# Patient Record
Sex: Female | Born: 1935 | Race: White | Hispanic: No | Marital: Single | Smoking: Never smoker
Health system: Southern US, Community
[De-identification: ages and names within clinical notes are randomized; demographics above are authoritative.]

## PROBLEM LIST (undated history)

## (undated) DIAGNOSIS — T7840XA Allergy, unspecified, initial encounter: Secondary | ICD-10-CM

## (undated) DIAGNOSIS — I10 Essential (primary) hypertension: Secondary | ICD-10-CM

## (undated) DIAGNOSIS — N3010 Interstitial cystitis (chronic) without hematuria: Secondary | ICD-10-CM

## (undated) HISTORY — DX: Essential (primary) hypertension: I10

## (undated) HISTORY — DX: Allergy, unspecified, initial encounter: T78.40XA

## (undated) HISTORY — DX: Interstitial cystitis (chronic) without hematuria: N30.10

## (undated) HISTORY — PX: OTHER SURGICAL HISTORY: SHX169

## (undated) HISTORY — PX: ABDOMINAL HYSTERECTOMY: SHX81

---

## 2001-02-11 ENCOUNTER — Encounter: Payer: Self-pay | Admitting: Family Medicine

## 2001-02-11 LAB — CONVERTED CEMR LAB

## 2004-10-03 ENCOUNTER — Ambulatory Visit: Payer: Self-pay | Admitting: Family Medicine

## 2004-10-08 ENCOUNTER — Ambulatory Visit: Payer: Self-pay | Admitting: Family Medicine

## 2004-10-11 ENCOUNTER — Ambulatory Visit: Payer: Self-pay | Admitting: Family Medicine

## 2004-10-17 ENCOUNTER — Ambulatory Visit: Payer: Self-pay | Admitting: Family Medicine

## 2004-12-20 ENCOUNTER — Ambulatory Visit: Payer: Self-pay | Admitting: Family Medicine

## 2005-04-11 ENCOUNTER — Ambulatory Visit: Payer: Self-pay | Admitting: Family Medicine

## 2005-06-27 ENCOUNTER — Ambulatory Visit: Payer: Self-pay | Admitting: Family Medicine

## 2005-07-30 ENCOUNTER — Ambulatory Visit: Payer: Self-pay | Admitting: Family Medicine

## 2005-12-25 ENCOUNTER — Ambulatory Visit: Payer: Self-pay | Admitting: Family Medicine

## 2006-01-15 ENCOUNTER — Ambulatory Visit: Payer: Self-pay | Admitting: Family Medicine

## 2006-01-21 ENCOUNTER — Ambulatory Visit: Payer: Self-pay | Admitting: Family Medicine

## 2006-07-10 ENCOUNTER — Ambulatory Visit: Payer: Self-pay | Admitting: Family Medicine

## 2006-07-21 ENCOUNTER — Encounter: Payer: Self-pay | Admitting: Family Medicine

## 2006-07-21 DIAGNOSIS — I10 Essential (primary) hypertension: Secondary | ICD-10-CM | POA: Insufficient documentation

## 2006-07-21 DIAGNOSIS — J309 Allergic rhinitis, unspecified: Secondary | ICD-10-CM | POA: Insufficient documentation

## 2006-07-21 DIAGNOSIS — M129 Arthropathy, unspecified: Secondary | ICD-10-CM | POA: Insufficient documentation

## 2008-11-17 ENCOUNTER — Ambulatory Visit: Payer: Self-pay | Admitting: Family Medicine

## 2008-11-17 DIAGNOSIS — N3030 Trigonitis without hematuria: Secondary | ICD-10-CM

## 2008-11-17 DIAGNOSIS — D649 Anemia, unspecified: Secondary | ICD-10-CM | POA: Insufficient documentation

## 2008-11-17 DIAGNOSIS — E039 Hypothyroidism, unspecified: Secondary | ICD-10-CM | POA: Insufficient documentation

## 2008-11-17 DIAGNOSIS — E559 Vitamin D deficiency, unspecified: Secondary | ICD-10-CM | POA: Insufficient documentation

## 2008-11-17 DIAGNOSIS — E785 Hyperlipidemia, unspecified: Secondary | ICD-10-CM

## 2008-11-17 LAB — CONVERTED CEMR LAB
Bilirubin Urine: NEGATIVE
Ketones, urine, test strip: NEGATIVE
Specific Gravity, Urine: 1.015

## 2008-11-20 LAB — CONVERTED CEMR LAB
BUN: 14 mg/dL (ref 6–23)
Basophils Absolute: 0 10*3/uL (ref 0.0–0.1)
Basophils Relative: 0.3 % (ref 0.0–3.0)
Bilirubin, Direct: 0 mg/dL (ref 0.0–0.3)
CO2: 34 meq/L — ABNORMAL HIGH (ref 19–32)
Calcium: 9.4 mg/dL (ref 8.4–10.5)
Chloride: 105 meq/L (ref 96–112)
Cholesterol: 156 mg/dL (ref 0–200)
Creatinine, Ser: 0.8 mg/dL (ref 0.4–1.2)
Eosinophils Absolute: 0.1 10*3/uL (ref 0.0–0.7)
Glucose, Bld: 90 mg/dL (ref 70–99)
HDL: 56.2 mg/dL (ref >39.00)
MCHC: 34.2 g/dL (ref 30.0–36.0)
MCV: 94.1 fL (ref 78.0–100.0)
Monocytes Absolute: 0.2 10*3/uL (ref 0.1–1.0)
Neutrophils Relative %: 64.2 % (ref 43.0–77.0)
Platelets: 163 10*3/uL (ref 150.0–400.0)
RBC: 4.39 M/uL (ref 3.87–5.11)
RDW: 12.9 % (ref 11.5–14.6)
Total Protein: 7.4 g/dL (ref 6.0–8.3)
Triglycerides: 75 mg/dL (ref 0.0–149.0)
Vit D, 25-Hydroxy: 66 ng/mL (ref 30–89)

## 2009-09-27 ENCOUNTER — Telehealth: Payer: Self-pay | Admitting: Family Medicine

## 2009-10-12 ENCOUNTER — Telehealth (INDEPENDENT_AMBULATORY_CARE_PROVIDER_SITE_OTHER): Payer: Self-pay | Admitting: *Deleted

## 2009-10-13 ENCOUNTER — Ambulatory Visit: Payer: Self-pay | Admitting: Family Medicine

## 2009-10-13 LAB — CONVERTED CEMR LAB
Bilirubin Urine: NEGATIVE
Blood in Urine, dipstick: NEGATIVE
Glucose, Urine, Semiquant: NEGATIVE
Specific Gravity, Urine: 1.015
WBC Urine, dipstick: NEGATIVE
pH: 7

## 2009-10-19 ENCOUNTER — Telehealth: Payer: Self-pay | Admitting: Family Medicine

## 2010-03-15 NOTE — Progress Notes (Signed)
Summary: Pt called and said that she is weak and bp very low  Phone Note Call from Patient Call back at Home Phone [phone]   Caller: Patient Summary of Call: Pt called and said that she feels weak and her blood pressure is very low. Reading early this a.m. was 98/44.  Pt says she doesn't feel like she can drive in for an ov.    Initial call taken by: Lucy Antigua,  September 27, 2009 2:04 PM  Follow-up for Phone Call        called pt, has had diarrhea and feels weak, BP fluctuates, to increase fluids, gatorade and potassium tabs , call if no better Follow-up by: [name] MD,  September 27, 2009 4:15 PM

## 2010-03-15 NOTE — Progress Notes (Signed)
Summary: restless leg  Phone Note Call from Patient Call back at Home Phone [phone]   Caller: Patient Call For: [name] MD Summary of Call: Pt states that her "restless leg" syndrome is worse, and wants some advice or RX. [phone] Initial call taken by: Lynann Beaver CMA,  October 19, 2009 10:57 AM  Follow-up for Phone Call        would she like to come in and discuss her medication options? There are several medications to consider Follow-up by: Danise Edge MD,  October 19, 2009 1:05 PM  Additional Follow-up for Phone Call Additional follow up Details #1::        Patient notified, she will discuss date/time with her husband and call the office back for appt. Additional Follow-up by: Lucious Groves CMA,  October 19, 2009 2:51 PM

## 2010-03-15 NOTE — Progress Notes (Signed)
Summary: UTI  Phone Note Call from Patient   Caller: Patient Call For: [name] MD Summary of Call: Pt is at home with hip trauma and cannot get out of the house, but thinks she has a UTI.  OK to send a Urine specimen cup, and bring it?? [phone] Initial call taken by: Lynann Beaver CMA,  October 12, 2009 12:46 PM  Follow-up for Phone Call        Yes anyone can p/u and return the specimen cup for her then we can treat as soon as we have the specimen Follow-up by: Danise Edge MD,  October 12, 2009 1:10 PM  Additional Follow-up for Phone Call Additional follow up Details #1::        pt informed and pts husband is coming to pick up a specimen cup Additional Follow-up by: Josph Macho RMA,  October 12, 2009 1:19 PM

## 2010-05-31 ENCOUNTER — Other Ambulatory Visit: Payer: Self-pay | Admitting: Family Medicine

## 2010-08-14 ENCOUNTER — Telehealth: Payer: Self-pay

## 2010-08-14 ENCOUNTER — Ambulatory Visit (INDEPENDENT_AMBULATORY_CARE_PROVIDER_SITE_OTHER): Payer: Medicare Other | Admitting: Family Medicine

## 2010-08-14 ENCOUNTER — Encounter: Payer: Self-pay | Admitting: Family Medicine

## 2010-08-14 VITALS — BP 160/80 | Temp 98.6°F | Wt 113.0 lb

## 2010-08-14 DIAGNOSIS — N390 Urinary tract infection, site not specified: Secondary | ICD-10-CM

## 2010-08-14 DIAGNOSIS — N3010 Interstitial cystitis (chronic) without hematuria: Secondary | ICD-10-CM

## 2010-08-14 DIAGNOSIS — R531 Weakness: Secondary | ICD-10-CM

## 2010-08-14 DIAGNOSIS — R5381 Other malaise: Secondary | ICD-10-CM

## 2010-08-14 DIAGNOSIS — F419 Anxiety disorder, unspecified: Secondary | ICD-10-CM

## 2010-08-14 DIAGNOSIS — I10 Essential (primary) hypertension: Secondary | ICD-10-CM

## 2010-08-14 DIAGNOSIS — E860 Dehydration: Secondary | ICD-10-CM

## 2010-08-14 DIAGNOSIS — F411 Generalized anxiety disorder: Secondary | ICD-10-CM

## 2010-08-14 LAB — POCT URINALYSIS DIPSTICK
Glucose, UA: NEGATIVE
Nitrite, UA: NEGATIVE
Urobilinogen, UA: 0.2
pH, UA: 5

## 2010-08-14 MED ORDER — DIAZEPAM 5 MG PO TABS: ORAL_TABLET | ORAL | Status: DC

## 2010-08-14 NOTE — Telephone Encounter (Signed)
Pt called stating that she does not know if she is exausted or dehydrated but she needs to be seen.  An appointment was give to pt for August 14, 2010 at 4:00pm.  Pt is aware.

## 2010-08-14 NOTE — Patient Instructions (Addendum)
You have mild dehydration, increase fluid intakr including water and gatorade Prescribed valium for anxiety and stress Urinalysis did not reveal infection Return if needed, also stay out of the heat of the day Take benicar for hypertension

## 2010-09-16 NOTE — Progress Notes (Signed)
  Subjective:    Patient ID: Brooke Collier, female    DOB: 12/10/1935, 75 y.o.   MRN: [id number] This 75 year old white married female is in today complaining of weakness and obvious dehydration. She is in very tense and upset since her husband has cancer and is undergoing chemotherapy. She has been outside in the heat and yardwork and not drinking sufficient fluids become dehydrated or chills so has a history of essential cystitis has had some urinary frequency which we will check a urinalysis she has no symptoms of infection no sore throat no cough no earache diarrhea no fever of 98.6, blood pressure 160/80 HPI    Review of Systems CHPI     Objective:   Physical Exam the patient is a well-built well-nourished white female who appears illl but in no distress HEENT oral mucosa dry pharynx is not infected years negative no anterior lymphadenopathy Heart and lung examination within normal limits Abdomen liver spleen kidneys nonpalpable no tenderness no masses bowel sounds normal no suprapubic tenderness Extremities negative        Assessment & Plan:  Dehydration increase fluid intake water plus Gatorade or any other bizarre of liquids Hypertension continue Benicar 20 mg daily Anxiety of started Valium 5 mg 3 times a day when necessary for stress Interstitial cystitis no evidence of infection at this time

## 2010-10-31 ENCOUNTER — Encounter: Payer: Self-pay | Admitting: Family Medicine

## 2010-10-31 ENCOUNTER — Ambulatory Visit (INDEPENDENT_AMBULATORY_CARE_PROVIDER_SITE_OTHER): Payer: Medicare Other | Admitting: Family Medicine

## 2010-10-31 VITALS — BP 142/86 | HR 53 | Temp 98.4°F | Wt 122.0 lb

## 2010-10-31 DIAGNOSIS — F419 Anxiety disorder, unspecified: Secondary | ICD-10-CM

## 2010-10-31 DIAGNOSIS — I10 Essential (primary) hypertension: Secondary | ICD-10-CM

## 2010-10-31 DIAGNOSIS — F411 Generalized anxiety disorder: Secondary | ICD-10-CM

## 2010-10-31 MED ORDER — METOPROLOL SUCCINATE ER 25 MG PO TB24: 50.0000 mg | ORAL_TABLET | Freq: Every day | ORAL | Status: DC

## 2010-10-31 MED ORDER — LORAZEPAM 0.5 MG PO TABS: 0.5000 mg | ORAL_TABLET | Freq: Three times a day (TID) | ORAL | Status: DC | PRN

## 2010-10-31 NOTE — Progress Notes (Signed)
  Subjective:    Patient ID:  Brooke Collier, female    DOB: 29-Aug-1935, 75 y.o.   MRN: [id number]  HPI 75 yr old patient of Dr. Scotty Court who is here for HTN. She feels well but her BP has been elevated for the past few weeks to the range of 140-150/90s. She takes one Metoprolol daily. She has been having some mild HAs but no chest pain or SOB. She has also run out of Lorazepam which works well for her.    Review of Systems  Constitutional: Negative.   Respiratory: Negative.   Cardiovascular: Negative.   Neurological: Positive for headaches.       Objective:   Physical Exam  Constitutional: She appears well-developed and well-nourished.  Neck: Neck supple. No thyromegaly present.  Cardiovascular: Normal rate, regular rhythm, normal heart sounds and intact distal pulses.   Pulmonary/Chest: Effort normal and breath sounds normal.  Lymphadenopathy:    She has no cervical adenopathy.          Assessment & Plan:  Her HTN has been slightly out of control. We will increase the Metoprolol to 2 tablets a day for a total of 50 mg. She will monitor the BP. Recheck in one month

## 2010-11-07 ENCOUNTER — Telehealth: Payer: Self-pay

## 2010-11-07 DIAGNOSIS — R300 Dysuria: Secondary | ICD-10-CM

## 2010-11-07 DIAGNOSIS — R350 Frequency of micturition: Secondary | ICD-10-CM

## 2010-11-07 NOTE — Telephone Encounter (Signed)
Pt called and stated she would like a return call.  Attempted to call pt but phone line was busy.  Will attempt again later.
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
## 2010-11-09 ENCOUNTER — Other Ambulatory Visit (INDEPENDENT_AMBULATORY_CARE_PROVIDER_SITE_OTHER): Payer: Medicare Other

## 2010-11-09 DIAGNOSIS — N390 Urinary tract infection, site not specified: Secondary | ICD-10-CM

## 2010-11-09 LAB — POCT URINALYSIS DIPSTICK
Ketones, UA: NEGATIVE
Protein, UA: NEGATIVE
Spec Grav, UA: 1.015
Urobilinogen, UA: 0.2
pH, UA: 7

## 2010-11-09 MED ORDER — OLMESARTAN MEDOXOMIL 20 MG PO TABS: 20.0000 mg | ORAL_TABLET | Freq: Every day | ORAL | Status: DC

## 2010-11-09 NOTE — Telephone Encounter (Signed)
Per Dr. Scotty Court pt to start back on Benicar 20 mg and pt is going to find a urologist for interstitial cystitis.

## 2010-11-09 NOTE — Telephone Encounter (Signed)
Addended by: Azucena Freed on: 11/09/2010 09:04 AM   Modules accepted: Orders

## 2010-11-09 NOTE — Telephone Encounter (Signed)
Addended by: Azucena Freed on: 11/09/2010 04:32 PM   Modules accepted: Orders

## 2010-11-09 NOTE — Telephone Encounter (Signed)
Pt is aware.  Pt called back and stated she had a urinary tract infection- pt instructed to come in and give a sample.

## 2011-03-29 ENCOUNTER — Telehealth: Payer: Self-pay | Admitting: *Deleted

## 2011-03-29 ENCOUNTER — Ambulatory Visit (INDEPENDENT_AMBULATORY_CARE_PROVIDER_SITE_OTHER): Payer: Medicare Other | Admitting: Family Medicine

## 2011-03-29 ENCOUNTER — Encounter: Payer: Self-pay | Admitting: Family Medicine

## 2011-03-29 VITALS — BP 154/84 | HR 67 | Temp 98.7°F | Wt 125.0 lb

## 2011-03-29 DIAGNOSIS — F419 Anxiety disorder, unspecified: Secondary | ICD-10-CM

## 2011-03-29 DIAGNOSIS — F411 Generalized anxiety disorder: Secondary | ICD-10-CM

## 2011-03-29 DIAGNOSIS — I10 Essential (primary) hypertension: Secondary | ICD-10-CM

## 2011-03-29 NOTE — Telephone Encounter (Signed)
Pt wanted appt for high BP, and I left message to call back to see if she has scheduled with any of our MDs, and if she needs an appt.  LMTCB.

## 2011-03-29 NOTE — Telephone Encounter (Signed)
Pt has seen Dr. Clent Ridges and would like to see Dr. Clent Ridges today. Appt scheduled.

## 2011-04-01 ENCOUNTER — Encounter: Payer: Self-pay | Admitting: Family Medicine

## 2011-04-01 MED ORDER — DIAZEPAM 5 MG PO TABS: ORAL_TABLET | ORAL | Status: DC

## 2011-04-01 MED ORDER — OLMESARTAN MEDOXOMIL 20 MG PO TABS: 40.0000 mg | ORAL_TABLET | Freq: Every day | ORAL | Status: DC

## 2011-04-01 NOTE — Progress Notes (Signed)
  Subjective:    Patient ID: Brooke Collier, female    DOB: [birth year], 76 y.o.   MRN: [id number]  HPI Here for elevated BPs at home. She has felt fine but is concerned about frequent readings in the range of 150s and 160s systolic. She has also been quite anxious at times, and Diazepam helps here. However she wonders if she could take a little more than she is currently taking.    Review of Systems  Constitutional: Negative.   Respiratory: Negative.   Cardiovascular: Negative.   Psychiatric/Behavioral: The patient is nervous/anxious.        Objective:   Physical Exam  Constitutional: She appears well-developed and well-nourished.  Neck: No thyromegaly present.  Cardiovascular: Normal rate, regular rhythm, normal heart sounds and intact distal pulses.   Pulmonary/Chest: Effort normal and breath sounds normal.  Lymphadenopathy:    She has no cervical adenopathy.  Psychiatric: She has a normal mood and affect. Her behavior is normal. Thought content normal.          Assessment & Plan:  We will increase the Diazepam to 2.5 mg bid and a full 5mg  at bedtime. Also increase the Benicar to a total of 40 mg a day. Recheck in one month

## 2011-04-12 ENCOUNTER — Ambulatory Visit (INDEPENDENT_AMBULATORY_CARE_PROVIDER_SITE_OTHER): Payer: Medicare Other | Admitting: Family Medicine

## 2011-04-12 ENCOUNTER — Encounter: Payer: Self-pay | Admitting: Family Medicine

## 2011-04-12 VITALS — BP 136/78 | HR 55 | Temp 98.4°F | Wt 124.0 lb

## 2011-04-12 DIAGNOSIS — R300 Dysuria: Secondary | ICD-10-CM

## 2011-04-12 DIAGNOSIS — I10 Essential (primary) hypertension: Secondary | ICD-10-CM

## 2011-04-12 DIAGNOSIS — N3010 Interstitial cystitis (chronic) without hematuria: Secondary | ICD-10-CM

## 2011-04-12 LAB — POCT URINALYSIS DIPSTICK
Bilirubin, UA: NEGATIVE
Ketones, UA: NEGATIVE
Spec Grav, UA: 1.01
pH, UA: 7.5

## 2011-04-12 MED ORDER — METOPROLOL SUCCINATE ER 50 MG PO TB24: 50.0000 mg | ORAL_TABLET | Freq: Two times a day (BID) | ORAL | Status: DC

## 2011-04-12 MED ORDER — OLMESARTAN MEDOXOMIL 40 MG PO TABS: 40.0000 mg | ORAL_TABLET | Freq: Every day | ORAL | Status: DC

## 2011-04-12 NOTE — Progress Notes (Signed)
  Subjective:    Patient ID: Brooke Collier, female    DOB: [birth year], 76 y.o.   MRN: [id number]  HPI Here to recheck her BP and for one week of intermittent burning on urinations. No fever or nausea. She has a hx of interstitial cystitis, and she thinks this has flared up again this week. Her BP at home is well controlled in the mornings but still goes up in the evenings to the 150s systolic.    Review of Systems  Constitutional: Negative.   Respiratory: Negative.   Cardiovascular: Negative.   Gastrointestinal: Negative.   Genitourinary: Positive for dysuria. Negative for urgency and hematuria.       Objective:   Physical Exam  Constitutional: She appears well-developed and well-nourished.  Cardiovascular: Normal rate, regular rhythm, normal heart sounds and intact distal pulses.   Pulmonary/Chest: Effort normal and breath sounds normal.  Abdominal: Soft. Bowel sounds are normal. She exhibits no distension and no mass. There is no tenderness. There is no rebound and no guarding.          Assessment & Plan:  Her urine today is clear so I do think she has another episode of IC. She has never seen a urologist about this, but instead tries to manage it with her diet. I offered to refer her to Urology today, but wants to think about it first. We will increase her Metoprolol to 50 mg bid, and this should control the BP nicely.

## 2011-04-25 ENCOUNTER — Telehealth: Payer: Self-pay | Admitting: Family Medicine

## 2011-04-25 NOTE — Telephone Encounter (Signed)
noted 

## 2011-04-25 NOTE — Telephone Encounter (Signed)
Pt has questions concerning bp med. Pt decline ov

## 2011-04-25 NOTE — Telephone Encounter (Signed)
Pt was only taking Metoprolol 50 daily.  Discussed to take Metoprolol 50 mg bid and the Benicar 40 mg one po daily.  BP is running 152/72 at the lower dose.   She still feels fatigued, but will try the correct dosage, and let us know how her BP runs, and how she feels.

## 2011-05-16 ENCOUNTER — Other Ambulatory Visit: Payer: Self-pay | Admitting: Family Medicine

## 2011-05-20 NOTE — Telephone Encounter (Signed)
Call in #[phone] with 5 rf 

## 2011-05-22 ENCOUNTER — Telehealth: Payer: Self-pay | Admitting: Family Medicine

## 2011-05-22 NOTE — Telephone Encounter (Signed)
Pt called req refill of diazepam (VALIUM) 5 MG tablet to [facility] [id number]. Pt only take 1/2 pill twice a day prn.  Pt req to be called when this has been done.

## 2011-05-23 NOTE — Telephone Encounter (Signed)
Call in #[phone] with 5 rf 

## 2011-05-23 NOTE — Telephone Encounter (Signed)
Pt is aware waiting on MD °

## 2011-05-24 MED ORDER — DIAZEPAM 5 MG PO TABS: 2.5000 mg | ORAL_TABLET | Freq: Two times a day (BID) | ORAL | Status: DC | PRN

## 2011-05-24 NOTE — Telephone Encounter (Signed)
Pt called to check status of rx she is concerned because she will be out over the weekend ..please contact when called in

## 2011-05-24 NOTE — Telephone Encounter (Signed)
Script called in and spoke with pt. 

## 2011-07-19 ENCOUNTER — Ambulatory Visit: Payer: Medicare Other | Admitting: Family Medicine

## 2011-12-31 ENCOUNTER — Other Ambulatory Visit: Payer: Self-pay | Admitting: Family Medicine

## 2012-01-01 NOTE — Telephone Encounter (Signed)
Call in #[phone] with 5 rf 

## 2012-03-14 ENCOUNTER — Other Ambulatory Visit: Payer: Self-pay | Admitting: Family Medicine

## 2013-01-05 ENCOUNTER — Other Ambulatory Visit: Payer: Self-pay | Admitting: Family Medicine

## 2013-05-05 DIAGNOSIS — R3129 Other microscopic hematuria: Secondary | ICD-10-CM | POA: Insufficient documentation

## 2013-06-10 DIAGNOSIS — Z792 Long term (current) use of antibiotics: Secondary | ICD-10-CM | POA: Insufficient documentation

## 2013-06-28 DIAGNOSIS — F32A Depression, unspecified: Secondary | ICD-10-CM | POA: Insufficient documentation

## 2014-02-15 DIAGNOSIS — Z0000 Encounter for general adult medical examination without abnormal findings: Secondary | ICD-10-CM | POA: Insufficient documentation

## 2014-02-15 DIAGNOSIS — D171 Benign lipomatous neoplasm of skin and subcutaneous tissue of trunk: Secondary | ICD-10-CM | POA: Insufficient documentation

## 2014-10-05 DIAGNOSIS — R5383 Other fatigue: Secondary | ICD-10-CM | POA: Insufficient documentation

## 2014-10-05 DIAGNOSIS — R42 Dizziness and giddiness: Secondary | ICD-10-CM | POA: Insufficient documentation

## 2016-05-04 ENCOUNTER — Emergency Department (HOSPITAL_COMMUNITY): Payer: Medicare HMO

## 2016-05-04 ENCOUNTER — Inpatient Hospital Stay (HOSPITAL_COMMUNITY)
Admission: EM | Admit: 2016-05-04 | Discharge: 2016-05-06 | DRG: 480 | Disposition: A | Discharge status: Skilled Nursing Facility | Payer: Medicare HMO | Attending: Internal Medicine | Admitting: Internal Medicine

## 2016-05-04 ENCOUNTER — Encounter (HOSPITAL_COMMUNITY): Payer: Self-pay | Admitting: Emergency Medicine

## 2016-05-04 ENCOUNTER — Inpatient Hospital Stay (HOSPITAL_COMMUNITY): Payer: Medicare HMO

## 2016-05-04 ENCOUNTER — Inpatient Hospital Stay (HOSPITAL_COMMUNITY): Payer: Medicare HMO | Admitting: Certified Registered Nurse Anesthetist

## 2016-05-04 ENCOUNTER — Encounter (HOSPITAL_COMMUNITY)
Admission: EM | Disposition: A | Discharge status: Skilled Nursing Facility | Payer: Self-pay | Source: Home / Self Care | Attending: Internal Medicine

## 2016-05-04 DIAGNOSIS — Z90710 Acquired absence of both cervix and uterus: Secondary | ICD-10-CM | POA: Diagnosis not present

## 2016-05-04 DIAGNOSIS — Z881 Allergy status to other antibiotic agents status: Secondary | ICD-10-CM

## 2016-05-04 DIAGNOSIS — D62 Acute posthemorrhagic anemia: Secondary | ICD-10-CM | POA: Diagnosis not present

## 2016-05-04 DIAGNOSIS — I10 Essential (primary) hypertension: Secondary | ICD-10-CM | POA: Diagnosis present

## 2016-05-04 DIAGNOSIS — W010XXA Fall on same level from slipping, tripping and stumbling without subsequent striking against object, initial encounter: Secondary | ICD-10-CM | POA: Diagnosis present

## 2016-05-04 DIAGNOSIS — S72142A Displaced intertrochanteric fracture of left femur, initial encounter for closed fracture: Secondary | ICD-10-CM | POA: Diagnosis present

## 2016-05-04 DIAGNOSIS — Z419 Encounter for procedure for purposes other than remedying health state, unspecified: Secondary | ICD-10-CM

## 2016-05-04 DIAGNOSIS — E039 Hypothyroidism, unspecified: Secondary | ICD-10-CM | POA: Diagnosis present

## 2016-05-04 DIAGNOSIS — S72002A Fracture of unspecified part of neck of left femur, initial encounter for closed fracture: Secondary | ICD-10-CM | POA: Diagnosis not present

## 2016-05-04 DIAGNOSIS — W19XXXA Unspecified fall, initial encounter: Secondary | ICD-10-CM

## 2016-05-04 DIAGNOSIS — F419 Anxiety disorder, unspecified: Secondary | ICD-10-CM | POA: Diagnosis present

## 2016-05-04 DIAGNOSIS — E785 Hyperlipidemia, unspecified: Secondary | ICD-10-CM | POA: Diagnosis present

## 2016-05-04 DIAGNOSIS — Z79899 Other long term (current) drug therapy: Secondary | ICD-10-CM | POA: Diagnosis not present

## 2016-05-04 DIAGNOSIS — M25552 Pain in left hip: Secondary | ICD-10-CM

## 2016-05-04 DIAGNOSIS — Y92000 Kitchen of unspecified non-institutional (private) residence as  the place of occurrence of the external cause: Secondary | ICD-10-CM | POA: Diagnosis not present

## 2016-05-04 DIAGNOSIS — S72002D Fracture of unspecified part of neck of left femur, subsequent encounter for closed fracture with routine healing: Secondary | ICD-10-CM | POA: Diagnosis not present

## 2016-05-04 DIAGNOSIS — E43 Unspecified severe protein-calorie malnutrition: Secondary | ICD-10-CM | POA: Diagnosis present

## 2016-05-04 DIAGNOSIS — S72009A Fracture of unspecified part of neck of unspecified femur, initial encounter for closed fracture: Secondary | ICD-10-CM | POA: Diagnosis present

## 2016-05-04 HISTORY — PX: INTRAMEDULLARY (IM) NAIL INTERTROCHANTERIC: SHX5875

## 2016-05-04 LAB — BASIC METABOLIC PANEL
ANION GAP: 6 (ref 5–15)
BUN: 17 mg/dL (ref 6–20)
CALCIUM: 8.8 mg/dL — AB (ref 8.9–10.3)
CO2: 29 mmol/L (ref 22–32)
CREATININE: 0.79 mg/dL (ref 0.44–1.00)
Chloride: 104 mmol/L (ref 101–111)
GFR calc Af Amer: >60 mL/min (ref >60)
Glucose, Bld: 149 mg/dL — ABNORMAL HIGH (ref 65–99)
Potassium: 3.3 mmol/L — ABNORMAL LOW (ref 3.5–5.1)
Sodium: 139 mmol/L (ref 135–145)

## 2016-05-04 LAB — CBC
HCT: 38.7 % (ref 36.0–46.0)
HEMOGLOBIN: 12.8 g/dL (ref 12.0–15.0)
MCH: 29.9 pg (ref 26.0–34.0)
MCHC: 33.1 g/dL (ref 30.0–36.0)
MCV: 90.4 fL (ref 78.0–100.0)
PLATELETS: 174 10*3/uL (ref 150–400)
RBC: 4.28 MIL/uL (ref 3.87–5.11)
RDW: 13.8 % (ref 11.5–15.5)
WBC: 7.2 10*3/uL (ref 4.0–10.5)

## 2016-05-04 SURGERY — FIXATION, FRACTURE, INTERTROCHANTERIC, WITH INTRAMEDULLARY ROD
Anesthesia: General | Site: Hip | Laterality: Left

## 2016-05-04 MED ORDER — LACTATED RINGERS IV BOLUS (SEPSIS)
500.0000 mL | Freq: Once | INTRAVENOUS | Status: DC
Start: 2016-05-04 — End: 2016-05-05

## 2016-05-04 MED ORDER — SUCCINYLCHOLINE CHLORIDE 200 MG/10ML IV SOSY
PREFILLED_SYRINGE | INTRAVENOUS | Status: AC
  Filled 2016-05-04: qty 10

## 2016-05-04 MED ORDER — EPHEDRINE SULFATE 50 MG/ML IJ SOLN
INTRAMUSCULAR | Status: DC | PRN
  Administered 2016-05-04: 10 mg via INTRAVENOUS
  Administered 2016-05-04: 5 mg via INTRAVENOUS
  Administered 2016-05-04 (×2): 10 mg via INTRAVENOUS

## 2016-05-04 MED ORDER — PROPOFOL 10 MG/ML IV BOLUS
INTRAVENOUS | Status: DC | PRN
  Administered 2016-05-04: 100 mg via INTRAVENOUS

## 2016-05-04 MED ORDER — ACETAMINOPHEN 325 MG PO TABS
650.0000 mg | ORAL_TABLET | Freq: Four times a day (QID) | ORAL | Status: DC | PRN
  Administered 2016-05-05 – 2016-05-06 (×2): 650 mg via ORAL
  Filled 2016-05-04 (×2): qty 2

## 2016-05-04 MED ORDER — 0.9 % SODIUM CHLORIDE (POUR BTL) OPTIME
TOPICAL | Status: DC | PRN
  Administered 2016-05-04: 1000 mL

## 2016-05-04 MED ORDER — DIAZEPAM 2 MG PO TABS: 2.0000 mg | ORAL_TABLET | Freq: Two times a day (BID) | ORAL | Status: DC | PRN

## 2016-05-04 MED ORDER — STERILE WATER FOR IRRIGATION IR SOLN
Status: DC | PRN
  Administered 2016-05-04: 1000 mL

## 2016-05-04 MED ORDER — LACTATED RINGERS IV SOLN
INTRAVENOUS | Status: DC
  Administered 2016-05-04: 15:00:00 via INTRAVENOUS

## 2016-05-04 MED ORDER — HYDROCODONE-ACETAMINOPHEN 5-325 MG PO TABS: 1.0000 | ORAL_TABLET | Freq: Four times a day (QID) | ORAL | Status: DC | PRN

## 2016-05-04 MED ORDER — PROMETHAZINE HCL 25 MG/ML IJ SOLN: 6.2500 mg | INTRAMUSCULAR | Status: DC | PRN

## 2016-05-04 MED ORDER — ACETAMINOPHEN 650 MG RE SUPP: 650.0000 mg | Freq: Four times a day (QID) | RECTAL | Status: DC | PRN

## 2016-05-04 MED ORDER — HYDROMORPHONE HCL 1 MG/ML IJ SOLN: 0.2500 mg | INTRAMUSCULAR | Status: DC | PRN

## 2016-05-04 MED ORDER — METHOCARBAMOL 1000 MG/10ML IJ SOLN
500.0000 mg | Freq: Four times a day (QID) | INTRAVENOUS | Status: DC | PRN
  Filled 2016-05-04: qty 5

## 2016-05-04 MED ORDER — TRAMADOL HCL 50 MG PO TABS: 100.0000 mg | ORAL_TABLET | Freq: Four times a day (QID) | ORAL | Status: DC | PRN

## 2016-05-04 MED ORDER — DEXAMETHASONE SODIUM PHOSPHATE 10 MG/ML IJ SOLN
INTRAMUSCULAR | Status: DC | PRN
  Administered 2016-05-04: 10 mg via INTRAVENOUS

## 2016-05-04 MED ORDER — LIDOCAINE 2% (20 MG/ML) 5 ML SYRINGE
INTRAMUSCULAR | Status: AC
  Filled 2016-05-04: qty 5

## 2016-05-04 MED ORDER — SUCCINYLCHOLINE CHLORIDE 200 MG/10ML IV SOSY
PREFILLED_SYRINGE | INTRAVENOUS | Status: DC | PRN
  Administered 2016-05-04: 100 mg via INTRAVENOUS

## 2016-05-04 MED ORDER — SODIUM CHLORIDE 0.9 % IV SOLN: INTRAVENOUS | Status: DC

## 2016-05-04 MED ORDER — MORPHINE SULFATE (PF) 4 MG/ML IV SOLN: 0.5000 mg | INTRAVENOUS | Status: DC | PRN

## 2016-05-04 MED ORDER — EPHEDRINE 5 MG/ML INJ
INTRAVENOUS | Status: AC
  Filled 2016-05-04: qty 10

## 2016-05-04 MED ORDER — FENTANYL CITRATE (PF) 250 MCG/5ML IJ SOLN
INTRAMUSCULAR | Status: AC
  Filled 2016-05-04: qty 5

## 2016-05-04 MED ORDER — CEFAZOLIN SODIUM-DEXTROSE 2-4 GM/100ML-% IV SOLN
INTRAVENOUS | Status: AC
  Filled 2016-05-04: qty 100

## 2016-05-04 MED ORDER — METHOCARBAMOL 500 MG PO TABS: 500.0000 mg | ORAL_TABLET | Freq: Four times a day (QID) | ORAL | Status: DC | PRN

## 2016-05-04 MED ORDER — CEFAZOLIN SODIUM-DEXTROSE 2-4 GM/100ML-% IV SOLN
2.0000 g | Freq: Four times a day (QID) | INTRAVENOUS | Status: AC
  Administered 2016-05-04 – 2016-05-05 (×2): 2 g via INTRAVENOUS
  Filled 2016-05-04 (×2): qty 100

## 2016-05-04 MED ORDER — ONDANSETRON HCL 4 MG PO TABS: 4.0000 mg | ORAL_TABLET | Freq: Four times a day (QID) | ORAL | Status: DC | PRN

## 2016-05-04 MED ORDER — ONDANSETRON HCL 4 MG/2ML IJ SOLN
INTRAMUSCULAR | Status: AC
  Filled 2016-05-04: qty 2

## 2016-05-04 MED ORDER — FENTANYL CITRATE (PF) 100 MCG/2ML IJ SOLN
INTRAMUSCULAR | Status: DC | PRN
  Administered 2016-05-04: 50 ug via INTRAVENOUS
  Administered 2016-05-04 (×3): 25 ug via INTRAVENOUS

## 2016-05-04 MED ORDER — SODIUM CHLORIDE 0.9 % IV SOLN
INTRAVENOUS | Status: DC
  Administered 2016-05-04: 18:00:00 via INTRAVENOUS

## 2016-05-04 MED ORDER — METOPROLOL SUCCINATE ER 50 MG PO TB24: 50.0000 mg | ORAL_TABLET | Freq: Two times a day (BID) | ORAL | Status: DC

## 2016-05-04 MED ORDER — ONDANSETRON HCL 4 MG/2ML IJ SOLN
INTRAMUSCULAR | Status: DC | PRN
  Administered 2016-05-04: 4 mg via INTRAVENOUS

## 2016-05-04 MED ORDER — PHENOL 1.4 % MT LIQD: 1.0000 | OROMUCOSAL | Status: DC | PRN

## 2016-05-04 MED ORDER — ONDANSETRON HCL 4 MG/2ML IJ SOLN
4.0000 mg | Freq: Once | INTRAMUSCULAR | Status: AC
  Administered 2016-05-04: 4 mg via INTRAVENOUS
  Filled 2016-05-04: qty 2

## 2016-05-04 MED ORDER — KETOROLAC TROMETHAMINE 15 MG/ML IJ SOLN
15.0000 mg | Freq: Four times a day (QID) | INTRAMUSCULAR | Status: DC | PRN
  Administered 2016-05-05: 08:00:00 15 mg via INTRAVENOUS
  Filled 2016-05-04: qty 1

## 2016-05-04 MED ORDER — HYDRALAZINE HCL 20 MG/ML IJ SOLN
5.0000 mg | INTRAMUSCULAR | Status: DC | PRN
Start: 2016-05-04 — End: 2016-05-06
  Filled 2016-05-04: qty 1

## 2016-05-04 MED ORDER — DEXAMETHASONE SODIUM PHOSPHATE 10 MG/ML IJ SOLN
INTRAMUSCULAR | Status: AC
  Filled 2016-05-04: qty 1

## 2016-05-04 MED ORDER — METOCLOPRAMIDE HCL 5 MG PO TABS: 5.0000 mg | ORAL_TABLET | Freq: Three times a day (TID) | ORAL | Status: DC | PRN

## 2016-05-04 MED ORDER — DOCUSATE SODIUM 100 MG PO CAPS
100.0000 mg | ORAL_CAPSULE | Freq: Two times a day (BID) | ORAL | Status: DC
  Administered 2016-05-05 – 2016-05-06 (×3): 100 mg via ORAL
  Filled 2016-05-04 (×3): qty 1

## 2016-05-04 MED ORDER — PROPOFOL 10 MG/ML IV BOLUS
INTRAVENOUS | Status: AC
  Filled 2016-05-04: qty 20

## 2016-05-04 MED ORDER — FENTANYL CITRATE (PF) 100 MCG/2ML IJ SOLN
50.0000 ug | Freq: Once | INTRAMUSCULAR | Status: AC
  Administered 2016-05-04: 50 ug via INTRAVENOUS
  Filled 2016-05-04: qty 2

## 2016-05-04 MED ORDER — MENTHOL 3 MG MT LOZG: 1.0000 | LOZENGE | OROMUCOSAL | Status: DC | PRN

## 2016-05-04 MED ORDER — CEFAZOLIN SODIUM-DEXTROSE 2-4 GM/100ML-% IV SOLN
2.0000 g | Freq: Once | INTRAVENOUS | Status: AC
  Administered 2016-05-04: 2 g via INTRAVENOUS

## 2016-05-04 MED ORDER — ASPIRIN EC 325 MG PO TBEC
325.0000 mg | DELAYED_RELEASE_TABLET | Freq: Every day | ORAL | Status: DC
  Administered 2016-05-05 – 2016-05-06 (×2): 325 mg via ORAL
  Filled 2016-05-04 (×2): qty 1

## 2016-05-04 MED ORDER — ONDANSETRON HCL 4 MG/2ML IJ SOLN
4.0000 mg | Freq: Four times a day (QID) | INTRAMUSCULAR | Status: DC | PRN
  Administered 2016-05-04 – 2016-05-05 (×2): 4 mg via INTRAVENOUS
  Filled 2016-05-04 (×2): qty 2

## 2016-05-04 MED ORDER — METOCLOPRAMIDE HCL 5 MG/ML IJ SOLN
INTRAMUSCULAR | Status: DC | PRN
  Administered 2016-05-04: 5 mg via INTRAVENOUS

## 2016-05-04 MED ORDER — IRBESARTAN 150 MG PO TABS
150.0000 mg | ORAL_TABLET | Freq: Every day | ORAL | Status: DC
Start: 2016-05-05 — End: 2016-05-06
  Administered 2016-05-05 – 2016-05-06 (×2): 150 mg via ORAL
  Filled 2016-05-04 (×2): qty 1

## 2016-05-04 MED ORDER — METOCLOPRAMIDE HCL 5 MG/ML IJ SOLN
5.0000 mg | Freq: Three times a day (TID) | INTRAMUSCULAR | Status: DC | PRN
  Administered 2016-05-04: 18:00:00 10 mg via INTRAVENOUS
  Filled 2016-05-04: qty 2

## 2016-05-04 SURGICAL SUPPLY — 30 items
BNDG GAUZE ELAST 4 BULKY (GAUZE/BANDAGES/DRESSINGS) ×3 IMPLANT
COVER PERINEAL POST (MISCELLANEOUS) ×3 IMPLANT
DRAPE STERI IOBAN 125X83 (DRAPES) ×3 IMPLANT
DRSG MEPILEX BORDER 4X4 (GAUZE/BANDAGES/DRESSINGS) ×6 IMPLANT
DRSG MEPILEX BORDER 4X8 (GAUZE/BANDAGES/DRESSINGS) IMPLANT
DURAPREP 26ML APPLICATOR (WOUND CARE) ×3 IMPLANT
ELECT REM PT RETURN 15FT ADLT (MISCELLANEOUS) ×3 IMPLANT
FACESHIELD WRAPAROUND (MASK) ×3 IMPLANT
FACESHIELD WRAPAROUND OR TEAM (MASK) ×1 IMPLANT
GAUZE XEROFORM 1X8 LF (GAUZE/BANDAGES/DRESSINGS) ×2 IMPLANT
GAUZE XEROFORM 5X9 LF (GAUZE/BANDAGES/DRESSINGS) ×3 IMPLANT
GLOVE BIO SURGEON STRL SZ7.5 (GLOVE) ×3 IMPLANT
GLOVE BIOGEL PI IND STRL 8 (GLOVE) ×1 IMPLANT
GLOVE BIOGEL PI INDICATOR 8 (GLOVE) ×2
GLOVE ECLIPSE 8.0 STRL XLNG CF (GLOVE) IMPLANT
GOWN STRL REUS W/TWL XL LVL3 (GOWN DISPOSABLE) ×3 IMPLANT
GUIDEWIRE 3.2X400 (WIRE) ×2 IMPLANT
KIT BASIN OR (CUSTOM PROCEDURE TRAY) ×3 IMPLANT
MANIFOLD NEPTUNE II (INSTRUMENTS) ×3 IMPLANT
NAIL CANN TFNA 9MM/130 360MM (Nail) ×2 IMPLANT
NS IRRIG 1000ML POUR BTL (IV SOLUTION) ×3 IMPLANT
PACK GENERAL/GYN (CUSTOM PROCEDURE TRAY) ×3 IMPLANT
POSITIONER SURGICAL ARM (MISCELLANEOUS) ×3 IMPLANT
SCREW TFNA 90MM HIP (Orthopedic Implant) ×2 IMPLANT
STAPLER VISISTAT 35W (STAPLE) ×3 IMPLANT
SUT VIC AB 0 CT1 36 (SUTURE) ×3 IMPLANT
SUT VIC AB 1 CT1 36 (SUTURE) ×3 IMPLANT
SUT VIC AB 2-0 CT1 27 (SUTURE) ×3
SUT VIC AB 2-0 CT1 TAPERPNT 27 (SUTURE) ×1 IMPLANT
TOWEL OR 17X26 10 PK STRL BLUE (TOWEL DISPOSABLE) ×3 IMPLANT

## 2016-05-04 NOTE — Anesthesia Procedure Notes (Signed)
Procedure Name: Intubation Date/Time: 05/04/2016 2:53 PM Performed by: West Pugh Pre-anesthesia Checklist: Patient identified, Emergency Drugs available, Suction available, Patient being monitored and Timeout performed Patient Re-evaluated:Patient Re-evaluated prior to inductionOxygen Delivery Method: Circle system utilized Preoxygenation: Pre-oxygenation with 100% oxygen Intubation Type: IV induction, Rapid sequence and Cricoid Pressure applied Laryngoscope Size: Mac and 4 Grade View: Grade II Tube type: Oral Tube size: 7.0 mm Number of attempts: 1 Airway Equipment and Method: Stylet Placement Confirmation: ETT inserted through vocal cords under direct vision,  positive ETCO2,  CO2 detector and breath sounds checked- equal and bilateral Secured at: 21 cm Tube secured with: Tape Dental Injury: Teeth and Oropharynx as per pre-operative assessment

## 2016-05-04 NOTE — Anesthesia Preprocedure Evaluation (Signed)
Anesthesia Evaluation  Patient identified by MRN, date of birth, ID band Patient awake    Reviewed: Allergy & Precautions, NPO status , Patient's Chart, lab work & pertinent test results  Airway Mallampati: II  TM Distance: >3 FB Neck ROM: Full    Dental no notable dental hx.    Pulmonary neg pulmonary ROS,    Pulmonary exam normal breath sounds clear to auscultation       Cardiovascular hypertension, Pt. on medications and Pt. on home beta blockers Normal cardiovascular exam Rhythm:Regular Rate:Normal     Neuro/Psych negative neurological ROS  negative psych ROS   GI/Hepatic negative GI ROS, Neg liver ROS,   Endo/Other  Hypothyroidism   Renal/GU negative Renal ROS  negative genitourinary   Musculoskeletal negative musculoskeletal ROS (+)   Abdominal   Peds negative pediatric ROS (+)  Hematology negative hematology ROS (+)   Anesthesia Other Findings   Reproductive/Obstetrics negative OB ROS                             Anesthesia Physical Anesthesia Plan  ASA: II  Anesthesia Plan: General   Post-op Pain Management:    Induction: Intravenous  Airway Management Planned: Oral ETT  Additional Equipment:   Intra-op Plan:   Post-operative Plan: Extubation in OR  Informed Consent: I have reviewed the patients History and Physical, chart, labs and discussed the procedure including the risks, benefits and alternatives for the proposed anesthesia with the patient or authorized representative who has indicated his/her understanding and acceptance.   Dental advisory given  Plan Discussed with: CRNA and Surgeon  Anesthesia Plan Comments:         Anesthesia Quick Evaluation

## 2016-05-04 NOTE — ED Triage Notes (Signed)
Patient here from home via [facility] with complaints of fall. Complaining of left hip pain and left arm pain. Pain 3/10.

## 2016-05-04 NOTE — Brief Op Note (Signed)
05/04/2016  3:56 PM  PATIENT:  Brooke Collier  81 y.o. female  PRE-OPERATIVE DIAGNOSIS:  left hip fracture  POST-OPERATIVE DIAGNOSIS:  left hip fracture  PROCEDURE:  Procedure(s): INTRAMEDULLARY (IM) NAIL INTERTROCHANTRIC (Left)  SURGEON:  Surgeon(s) and Role:    * Mcarthur Rossetti, MD - Primary  ANESTHESIA:   general  EBL:  Total I/O In: -  Out: 100 [Blood:100]  COUNTS:  YES  DICTATION: .Other Dictation: Dictation Number [id number]  PLAN OF CARE: Admit to inpatient   PATIENT DISPOSITION:  PACU - hemodynamically stable.   Delay start of Pharmacological VTE agent (>24hrs) due to surgical blood loss or risk of bleeding: no

## 2016-05-04 NOTE — ED Provider Notes (Signed)
[facility] DEPT Provider Note   CSN: [id number] Arrival date & time: 05/04/16  1027     History   Chief Complaint Chief Complaint  Patient presents with  . Hip Pain    HPI Brooke Collier is a 81 y.o. female who presents via EMS with left sided hip and leg pain that began at after a mechanical fall that occurred at 0900 this AM. Patient was walking in the kitchen when she tripped over something on the floor, causing her to fall. Patient states she landed on her left side and did not hit her head. She denies any LOC. She denies any preceding weakness, dizziness or CP prior to falling. She was not able to move her LLE or ambulate since the incident. Her pain is 3/10 and worsened with movement of LLE. She denies any history of osteoporosis. She denies any numbness/tingling of LLE, CP, SOB, or other concerns.   The history is provided by the patient.    Past Medical History:  Diagnosis Date  . Allergy   . Chronic interstitial cystitis   . Hypertension     Patient Active Problem List   Diagnosis Date Noted  . Hip fracture ([place]) 05/04/2016  . Closed comminuted intertrochanteric fracture of proximal end of left femur ([place])   . HYPOTHYROIDISM 11/17/2008  . UNSPECIFIED VITAMIN D DEFICIENCY 11/17/2008  . HLD (hyperlipidemia) 11/17/2008  . ANEMIA 11/17/2008  . 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 Essential hypertension 07/21/2006  . ALLERGIC RHINITIS 07/21/2006  . ARTHRITIS 07/21/2006    Past Surgical History:  Procedure Laterality Date  . ABDOMINAL HYSTERECTOMY    . tonsillectomyy      OB History    No data available       Home Medications    Prior to Admission medications   Medication Sig Start Date End Date Taking? Authorizing Provider  amLODipine (NORVASC) 2.5 MG tablet Take 2.5 mg by mouth daily. 04/18/16  Yes Historical Provider, MD  BENICAR 40 MG tablet TAKE 1 TABLET BY MOUTH EVERY DAY 03/14/12  Yes Laurey Morale, MD  Calcium Carb-Cholecalciferol  (CALCIUM-VITAMIN D) 500-200 MG-UNIT tablet Take 1 tablet by mouth daily.   Yes Historical Provider, MD  Calcium Carbonate (CALCIUM 500 PO) Take by mouth daily.     Yes Historical Provider, MD  Co-Enzyme Q-10 30 MG CAPS Take 30 mg by mouth daily.   Yes Historical Provider, MD  Ergocalciferol 2000 units TABS Take 2,000 Units by mouth daily.   Yes Historical Provider, MD  QUERCETIN PO Take 1 g by mouth daily.   Yes Historical Provider, MD  UNABLE TO FIND Take 1 capsule by mouth daily. Cysta Q - Pt states she takes this for her interstitial nephritis per [facility] Recommendations   Yes Historical Provider, MD  diazepam (VALIUM) 5 MG tablet TAKE 1/2 TABLET BY MOUTH TWICE DAILY    AS NEEDED Patient not taking: Reported on 05/04/2016 12/31/11   Laurey Morale, MD  metoprolol succinate (TOPROL-XL) 50 MG 24 hr tablet Take 1 tablet (50 mg total) by mouth 2 (two) times daily. Patient not taking: Reported on 05/04/2016 04/12/11   Laurey Morale, MD    Family History No family history on file.  Social History Social History  Substance Use Topics  . Smoking status: Never Smoker  . Smokeless tobacco: Never Used  . Alcohol use No     Allergies   Ciprofloxacin   Review of Systems Review of Systems  Constitutional: Negative for chills and fever.  HENT: Negative for congestion.   Respiratory: Negative for cough and shortness of breath.   Cardiovascular: Negative for chest pain.  Gastrointestinal: Negative for abdominal pain, diarrhea, nausea and vomiting.  Genitourinary: Negative for dysuria and hematuria.  Musculoskeletal: Negative for back pain.       +left side hip pain  Skin: Negative for rash.  Neurological: Negative for dizziness, weakness, numbness and headaches.  Psychiatric/Behavioral: Negative for confusion.  All other systems reviewed and are negative.    Physical Exam Updated Vital Signs BP (!) 163/87   Pulse 83   Temp 98.3 F (36.8 C) (Oral)   Resp 15   SpO2 99%   Physical Exam   Constitutional: She is oriented to person, place, and time. She appears well-developed and well-nourished.  HENT:  Head: Normocephalic and atraumatic.  Mouth/Throat: Oropharynx is clear and moist and mucous membranes are normal.  Eyes: Conjunctivae, EOM and lids are normal. Pupils are equal, round, and reactive to light.  Neck: Full passive range of motion without pain.  Cardiovascular: Normal rate, regular rhythm, normal heart sounds and normal pulses.  Exam reveals no gallop and no friction rub.   No murmur heard. Pulses:      Dorsalis pedis pulses are 2+ on the right side, and 2+ on the left side.  Pulmonary/Chest: Effort normal and breath sounds normal.  Abdominal: Soft. Normal appearance. There is no tenderness. There is no rigidity and no guarding.  Musculoskeletal:       Right hip: She exhibits normal range of motion, no tenderness and no deformity.       Left hip: She exhibits decreased range of motion, tenderness and deformity.  Tenderness to palpation of left proximal femur. No tenderness to left distal femur, knee, or tib/fib.  LLE with slight rotation. Pain with even slightest movement of LLE. No evidence of dislocation.  Pelvis stable.  No tenderness to right hip.  Neurological: She is alert and oriented to person, place, and time.  Skin: Skin is warm and dry. Capillary refill takes less than 2 seconds.  Psychiatric: She has a normal mood and affect. Her speech is normal.  Nursing note and vitals reviewed.    ED Treatments / Results  Labs (all labs ordered are listed, but only abnormal results are displayed) Labs Reviewed  BASIC METABOLIC PANEL - Abnormal; Notable for the following:       Result Value   Potassium 3.3 (*)    Glucose, Bld 149 (*)    Calcium 8.8 (*)    All other components within normal limits  CBC    EKG  EKG Interpretation  Date/Time:  Saturday May 04 2016 10:57:38 EDT Ventricular Rate:  64 PR Interval:    QRS Duration: 103 QT  Interval:  430 QTC Calculation: 444 R Axis:   80 Text Interpretation:  Sinus rhythm Atrial premature complexes Borderline ST depression, diffuse leads No acute changes No significant change since last tracing Confirmed by NANAVATI, MD, Thelma Comp [phone]) on 05/04/2016 11:47:42 AM       Radiology Ct Head Wo Contrast  Result Date: 05/04/2016 CLINICAL DATA:  Pain following fall EXAM: CT HEAD WITHOUT CONTRAST TECHNIQUE: Contiguous axial images were obtained from the base of the skull through the vertex without intravenous contrast. COMPARISON:  None. FINDINGS: Brain: There is mild diffuse atrophy. There is no intracranial mass, hemorrhage, extra-axial fluid collection, or midline shift. There is small vessel disease throughout the centra semiovale bilaterally, most pronounced adjacent to the superior atria of the lateral  ventricles bilaterally. Elsewhere gray-white compartments are normal. No acute infarct evident. Vascular: No hyperdense vessel. There is calcification in each carotid siphon region. Skull: Bony calvarium appears intact, although there is osteoporosis. Sinuses/Orbits: There is mucosal thickening in several ethmoid air cells bilaterally. Other visualized paranasal sinuses are clear. There is rightward deviation of the nasal septum. Orbits appear symmetric bilaterally. Other: Mastoid air cells are clear. IMPRESSION: Atrophy with fairly extensive supratentorial small vessel disease. No acute infarct. No hemorrhage, mass, or extra-axial fluid collection. There are foci of RT vascular calcification. There is mild mucosal thickening in several ethmoid air cells bilaterally. There is rightward deviation of the nasal septum. Electronically Signed   By: Lowella Grip III M.D.   On: 05/04/2016 13:16   Dg Chest Portable 1 View  Result Date: 05/04/2016 CLINICAL DATA:  Pain following fall EXAM: PORTABLE CHEST 1 VIEW COMPARISON:  None. FINDINGS: There is subtle ill-defined opacity in the right upper lobe  medially. Lungs elsewhere are clear. Heart size and pulmonary vascularity are normal. No adenopathy. No bone lesions. No pneumothorax peer IMPRESSION: Subtle ill-defined opacity right upper lobe medially. Question early pneumonia in this area. Lungs elsewhere clear. Cardiac silhouette within normal limits. Followup PA and lateral chest radiographs recommended in 3-4 weeks following trial of antibiotic therapy to ensure resolution and exclude underlying malignancy. Electronically Signed   By: Lowella Grip III M.D.   On: 05/04/2016 11:56   Dg C-arm 1-60 Min-no Report  Result Date: 05/04/2016 Fluoroscopy was utilized by the requesting physician.  No radiographic interpretation.   Dg Hip Unilat With Pelvis Min 4 Views Left  Result Date: 05/04/2016 CLINICAL DATA:  Pain following fall EXAM: DG HIP (WITH OR WITHOUT PELVIS) 2+V LEFT COMPARISON:  None. FINDINGS: Frontal pelvis as well as frontal and lateral left hip images were obtained. There is a comminuted intertrochanteric femur fracture on the left with varus angulation of the fracture site. No other fractures are evident. No dislocation. There is mild symmetric narrowing of both hip joints. There is extensive calcification in both common and superficial femoral arteries. IMPRESSION: Comminuted intertrochanteric femur fracture on the left with varus angulation at the fracture site. No other fractures. No dislocations. Mild symmetric narrowing both hip joints. There is femoral artery atherosclerosis bilaterally. Electronically Signed   By: Lowella Grip III M.D.   On: 05/04/2016 11:58    Procedures Procedures (including critical care time)  Medications Ordered in ED Medications  hydrALAZINE (APRESOLINE) injection 5 mg ( Intravenous MAR Hold 05/04/16 1519)  lactated ringers bolus 500 mL ( Intravenous MAR Hold 05/04/16 1519)  promethazine (PHENERGAN) injection 6.25-12.5 mg (not administered)  HYDROmorphone (DILAUDID) injection 0.25-0.5 mg (not  administered)  ceFAZolin (ANCEF) 2-4 GM/100ML-% IVPB (not administered)  lactated ringers infusion ( Intravenous Anesthesia Volume Adjustment 05/04/16 1614)  fentaNYL (SUBLIMAZE) injection 50 mcg (50 mcg Intravenous Given 05/04/16 1119)  ondansetron (ZOFRAN) injection 4 mg (4 mg Intravenous Given 05/04/16 1202)  ceFAZolin (ANCEF) IVPB 2g/100 mL premix (2 g Intravenous Given 05/04/16 1459)     Initial Impression / Assessment and Plan / ED Course  I have reviewed the triage vital signs and the nursing notes.  Pertinent labs & imaging results that were available during my care of the patient were reviewed by me and considered in my medical decision making (see chart for details).    81 yo F with L hip pain after mechanical fall this morning at 0900. Concern for hip fracture given mechanism of injury and exam. Will XR L  hip to eval for fracture. Will give fentanyl for pain control. Will also check CT, CXR, EKG, CBC, and BMP for likely admission.   XR Hip shows comminuted intertrochanteric femur fracture. Hospitalist notified. Will contact ortho regarding fracture.   12:45 PM: Discussed results with patient and daughter. Patient has an ortho doctor that she has previously seen but he is sports medicine. Patient/family agree to contact ortho on call. Patient still feeling nauseous. Denies wanting any more pain meds. Will bolus with LR for additional fluid resuscitation. Offered additional pain medication but patient denies at this time.   1:00 PM: Dr. Marla Roe discussed with Dr. [name] (ortho on call). Will evaluate patient for surgery.    Final Clinical Impressions(s) / ED Diagnoses   Final diagnoses:  Fall    New Prescriptions Current Discharge Medication List       Brooke Collier, [place] 05/04/16 [place], MD 05/05/16 1021

## 2016-05-04 NOTE — Anesthesia Postprocedure Evaluation (Signed)
Anesthesia Post Note  Patient: Brooke Collier  Procedure(s) Performed: Procedure(s) (LRB): INTRAMEDULLARY (IM) NAIL INTERTROCHANTRIC (Left)  Patient location during evaluation: PACU Anesthesia Type: General Level of consciousness: awake and alert Pain management: pain level controlled Vital Signs Assessment: post-procedure vital signs reviewed and stable Respiratory status: spontaneous breathing, nonlabored ventilation, respiratory function stable and patient connected to nasal cannula oxygen Cardiovascular status: blood pressure returned to baseline and stable Postop Assessment: no signs of nausea or vomiting Anesthetic complications: no       Last Vitals:  Vitals:   05/04/16 1847 05/04/16 2050  BP: (!) 164/81 (!) 161/80  Pulse: 92 92  Resp: 17 16  Temp: 36.5 C 36.6 C    Last Pain:  Vitals:   05/04/16 2050  TempSrc: Oral  PainSc:                  Drexler Maland S

## 2016-05-04 NOTE — Transfer of Care (Signed)
Immediate Anesthesia Transfer of Care Note  Patient: Brooke Collier  Procedure(s) Performed: Procedure(s): INTRAMEDULLARY (IM) NAIL INTERTROCHANTRIC (Left)  Patient Location: PACU  Anesthesia Type:General  Level of Consciousness:  sedated, patient cooperative and responds to stimulation  Airway & Oxygen Therapy:Patient Spontanous Breathing and Patient connected to face mask oxgen  Post-op Assessment:  Report given to PACU RN and Post -op Vital signs reviewed and stable  Post vital signs:  Reviewed and stable  Last Vitals:  Vitals:   05/04/16 1230 05/04/16 1300  BP: (!) 168/79 (!) 163/87  Pulse: 82 83  Resp: 17 15  Temp:      Complications: No apparent anesthesia complications

## 2016-05-04 NOTE — ED Notes (Signed)
Bed: RP39 Expected date:  Expected time:  Means of arrival:  Comments: 81 yo hip injury

## 2016-05-04 NOTE — H&P (Signed)
History and Physical    Brooke Collier VVO:[id number] DOB: [birth year] DOA: 05/04/2016  I have briefly reviewed the patient's prior medical records in [facility]  PCP: [name], MD  Patient coming from: Home  Chief Complaint: Hip pain after having a fall  HPI: Brooke Collier is a 81 y.o. female with medical history significant of hypertension, anxiety, but otherwise healthy, presents to the emergency room with chief complaint of left hip pain after having a fall.  She was in her house today, doing some chores when she tripped and fell on the ground on her left side.  She denies any chest pains, she denies any syncope or passing out.  She never lost consciousness and has full recollection of her fall.  She denies any fever or chills.  She has had no abdominal pain, nausea or vomiting at home.  She is mildly nauseous in the ED after receiving pain medications.  She has been having intermittent palpitations over the last week, short-lived and resolving on their own.  She is usually independent, needs no assistance with activities of daily living and other than the hip pain she has no complaints.  ED Course: In the ED, her vital signs show elevated blood pressure 160s-170s, blood work is essentially unremarkable.  She has a x-ray of the hip which showed comminuted intertrochanteric femur fracture on the left with varus angulation at the fracture site.  ED physician discussed with Dr. [name] with orthopedic surgery, and he will see patient in consultation and probably operate later today  Review of Systems: As per HPI otherwise 10 point review of systems negative.   Past Medical History:  Diagnosis Date  . Allergy   . Chronic interstitial cystitis   . Hypertension     Past Surgical History:  Procedure Laterality Date  . ABDOMINAL HYSTERECTOMY    . tonsillectomyy       reports that she has never smoked. She has never used smokeless tobacco. She reports that she does not  drink alcohol or use drugs.  Allergies  Allergen Reactions  . Ciprofloxacin     Muscle weakness    Family history reviewed and noncontributory  Prior to Admission medications   Medication Sig Start Date End Date Taking? Authorizing Provider  BENICAR 40 MG tablet TAKE 1 TABLET BY MOUTH EVERY DAY 03/14/12   Laurey Morale, MD  Calcium Carbonate (CALCIUM 500 PO) Take by mouth daily.      Historical Provider, MD  diazepam (VALIUM) 5 MG tablet TAKE 1/2 TABLET BY MOUTH TWICE DAILY    AS NEEDED 12/31/11   Laurey Morale, MD  metoprolol succinate (TOPROL-XL) 50 MG 24 hr tablet Take 1 tablet (50 mg total) by mouth 2 (two) times daily. 04/12/11   Laurey Morale, MD    Physical Exam: Vitals:   05/04/16 1041 05/04/16 1057 05/04/16 1200  BP: (!) 148/81 (!) 168/71 (!) 182/88  Pulse: 69 70 (!) 116  Resp: 16 14 11   Temp: 98.3 F (36.8 C)    TempSrc: Oral    SpO2: 98% 100% 96%    Constitutional: NAD, calm, comfortable Vitals:   05/04/16 1041 05/04/16 1057 05/04/16 1200  BP: (!) 148/81 (!) 168/71 (!) 182/88  Pulse: 69 70 (!) 116  Resp: 16 14 11   Temp: 98.3 F (36.8 C)    TempSrc: Oral    SpO2: 98% 100% 96%   Eyes: PERRL, lids and conjunctivae normal ENMT: Mucous membranes are moist. Posterior pharynx clear of  any exudate or lesions.  Neck: normal, supple, no masses Respiratory: clear to auscultation bilaterally, no wheezing, no crackles. Normal respiratory effort. No accessory muscle use.  Cardiovascular: Regular rate and rhythm, no murmurs / rubs / gallops. No extremity edema. 2+ pedal pulses.  Abdomen: slight tenderness in the epigastric area, no masses palpated. Bowel sounds positive.  Musculoskeletal: no clubbing / cyanosis. Normal muscle tone.  Skin: no rashes, lesions, ulcers. No induration Neurologic: CN 2-12 grossly intact. Strength 5/5 in all 4.  Psychiatric: Normal judgment and insight. Alert and oriented x 3. Normal mood.   Labs on Admission: I have personally reviewed following  labs and imaging studies  CBC:  Recent Labs Lab 05/04/16 1111  WBC 7.2  HGB 12.8  HCT 38.7  MCV 90.4  PLT 361   Basic Metabolic Panel:  Recent Labs Lab 05/04/16 1111  NA 139  K 3.3*  CL 104  CO2 29  GLUCOSE 149*  BUN 17  CREATININE 0.79  CALCIUM 8.8*   GFR: CrCl cannot be calculated (Unknown ideal weight.). Liver Function Tests: No results for input(s): AST, ALT, ALKPHOS, BILITOT, PROT, ALBUMIN in the last 168 hours. No results for input(s): LIPASE, AMYLASE in the last 168 hours. No results for input(s): AMMONIA in the last 168 hours. Coagulation Profile: No results for input(s): INR, PROTIME in the last 168 hours. Cardiac Enzymes: No results for input(s): CKTOTAL, CKMB, CKMBINDEX, TROPONINI in the last 168 hours. BNP (last 3 results) No results for input(s): PROBNP in the last 8760 hours. HbA1C: No results for input(s): HGBA1C in the last 72 hours. CBG: No results for input(s): GLUCAP in the last 168 hours. Lipid Profile: No results for input(s): CHOL, HDL, LDLCALC, TRIG, CHOLHDL, LDLDIRECT in the last 72 hours. Thyroid Function Tests: No results for input(s): TSH, T4TOTAL, FREET4, T3FREE, THYROIDAB in the last 72 hours. Anemia Panel: No results for input(s): VITAMINB12, FOLATE, FERRITIN, TIBC, IRON, RETICCTPCT in the last 72 hours. Urine analysis:    Component Value Date/Time   COLORURINE yellow 10/13/2009 1514   APPEARANCEUR Clear 10/13/2009 1514   LABSPEC 1.015 10/13/2009 1514   PHURINE 7.0 10/13/2009 1514   HGBUR negative 10/13/2009 1514   BILIRUBINUR neg 04/12/2011 1234   PROTEINUR neg 04/12/2011 1234   UROBILINOGEN 0.2 04/12/2011 1234   UROBILINOGEN 0.2 10/13/2009 1514   NITRITE neg 04/12/2011 1234   NITRITE negative 10/13/2009 1514   LEUKOCYTESUR Negative 04/12/2011 1234     Radiological Exams on Admission: Dg Chest Portable 1 View  Result Date: 05/04/2016 CLINICAL DATA:  Pain following fall EXAM: PORTABLE CHEST 1 VIEW COMPARISON:  None.  FINDINGS: There is subtle ill-defined opacity in the right upper lobe medially. Lungs elsewhere are clear. Heart size and pulmonary vascularity are normal. No adenopathy. No bone lesions. No pneumothorax peer IMPRESSION: Subtle ill-defined opacity right upper lobe medially. Question early pneumonia in this area. Lungs elsewhere clear. Cardiac silhouette within normal limits. Followup PA and lateral chest radiographs recommended in 3-4 weeks following trial of antibiotic therapy to ensure resolution and exclude underlying malignancy. Electronically Signed   By: Lowella Grip III M.D.   On: 05/04/2016 11:56   Dg Hip Unilat With Pelvis Min 4 Views Left  Result Date: 05/04/2016 CLINICAL DATA:  Pain following fall EXAM: DG HIP (WITH OR WITHOUT PELVIS) 2+V LEFT COMPARISON:  None. FINDINGS: Frontal pelvis as well as frontal and lateral left hip images were obtained. There is a comminuted intertrochanteric femur fracture on the left with varus angulation of the fracture  site. No other fractures are evident. No dislocation. There is mild symmetric narrowing of both hip joints. There is extensive calcification in both common and superficial femoral arteries. IMPRESSION: Comminuted intertrochanteric femur fracture on the left with varus angulation at the fracture site. No other fractures. No dislocations. Mild symmetric narrowing both hip joints. There is femoral artery atherosclerosis bilaterally. Electronically Signed   By: Lowella Grip III M.D.   On: 05/04/2016 11:58    EKG: Independently reviewed.  Sinus rhythm  Assessment/Plan Active Problems:   HLD (hyperlipidemia)   Essential hypertension   Hip fracture (HCC)   Hip fracture -Orthopedic surgery will see patient, plan for operative repair later today -From medical standpoint, the perioperative risk is low at 0.2%, no further investigations are required and she can proceed with surgery  Hypertension -She is n.p.o., resume her home  antihypertensive starting tomorrow, place patient on IV hydralazine as needed  Anxiety -Resume home medications   DVT prophylaxis: Per orthopedic surgery postop,  SCDs Code Status: Full code Family Communication: Discussed with daughter at bedside Disposition Plan: Admit to [facility] called: Orthopedic surgery    Admission status: Inpatient    At the time of admission, it appears that the appropriate admission status for this patient is INPATIENT. This is judged to be reasonable and necessary in order to provide the required high service intensity to ensure the patient's safety given the presenting symptoms, physical exam findings, and initial radiographic and laboratory data in the context of their chronic comorbidities. Current circumstances are hip fracture requiring operative repair, and it is felt to place patient at high risk for further clinical deterioration threatening life, limb, or organ. Moreover, it is my clinical judgment that the patient will require inpatient hospital care spanning beyond 2 midnights from the point of admission and that early discharge would result in unnecessary risk of decompensation and readmission or threat to life, limb or bodily function.   Marzetta Board, MD [facility] Pager [phone]  If 7PM-7AM, please contact night-coverage [email or web address] Password [facility]  05/04/2016, 12:50 PM

## 2016-05-04 NOTE — Consult Note (Signed)
Reason for Consult:  Left hip fracture Referring Physician: Derwood Kaplan, MD  Brooke Collier is an 81 y.o. female.  HPI:   81 yo female who sustained an accidental mechanical fall this am when she got tripped up turning around.  Landed directly on her left hip.  Noted severe left hip pain and the inability to ambulate.  Was brought to the [facility] ED and found to have a left hip fracture.  Ortho is consulted for further evaluation and treatment.  She denies LOC or a syncopal episode.  She denies chest pain, shortness or breath, fever, chills, but has nausea since being here in the ED.  Her left hip pain is 10 out of 10.  She denies other injuries.  Past Medical History:  Diagnosis Date  . Allergy   . Chronic interstitial cystitis   . Hypertension     Past Surgical History:  Procedure Laterality Date  . ABDOMINAL HYSTERECTOMY    . tonsillectomyy      No family history on file.  Social History:  reports that she has never smoked. She has never used smokeless tobacco. She reports that she does not drink alcohol or use drugs.  Allergies:  Allergies  Allergen Reactions  . Ciprofloxacin     Muscle weakness    Medications: I have reviewed the patient's current medications.  Results for orders placed or performed during the hospital encounter of 05/04/16 (from the past 48 hour(s))  CBC     Status: None   Collection Time: 05/04/16 11:11 AM  Result Value Ref Range   WBC 7.2 4.0 - 10.5 K/uL   RBC 4.28 3.87 - 5.11 MIL/uL   Hemoglobin 12.8 12.0 - 15.0 g/dL   HCT 47.5 74.7 - 13.9 %   MCV 90.4 78.0 - 100.0 fL   MCH 29.9 26.0 - 34.0 pg   MCHC 33.1 30.0 - 36.0 g/dL   RDW 32.0 42.8 - 93.5 %   Platelets 174 150 - 400 K/uL  Basic metabolic panel     Status: Abnormal   Collection Time: 05/04/16 11:11 AM  Result Value Ref Range   Sodium 139 135 - 145 mmol/L   Potassium 3.3 (L) 3.5 - 5.1 mmol/L   Chloride 104 101 - 111 mmol/L   CO2 29 22 - 32 mmol/L   Glucose, Bld 149 (H) 65 -  99 mg/dL   BUN 17 6 - 20 mg/dL   Creatinine, Ser 7.66 0.44 - 1.00 mg/dL   Calcium 8.8 (L) 8.9 - 10.3 mg/dL   GFR calc non Af Amer >60 >60 mL/min   GFR calc Af Amer >60 >60 mL/min    Comment: (NOTE) The eGFR has been calculated using the CKD EPI equation. This calculation has not been validated in all clinical situations. eGFR's persistently <60 mL/min signify possible Chronic Kidney Disease.    Anion gap 6 5 - 15    Ct Head Wo Contrast  Result Date: 05/04/2016 CLINICAL DATA:  Pain following fall EXAM: CT HEAD WITHOUT CONTRAST TECHNIQUE: Contiguous axial images were obtained from the base of the skull through the vertex without intravenous contrast. COMPARISON:  None. FINDINGS: Brain: There is mild diffuse atrophy. There is no intracranial mass, hemorrhage, extra-axial fluid collection, or midline shift. There is small vessel disease throughout the centra semiovale bilaterally, most pronounced adjacent to the superior atria of the lateral ventricles bilaterally. Elsewhere gray-white compartments are normal. No acute infarct evident. Vascular: No hyperdense vessel. There is calcification in each carotid siphon  region. Skull: Bony calvarium appears intact, although there is osteoporosis. Sinuses/Orbits: There is mucosal thickening in several ethmoid air cells bilaterally. Other visualized paranasal sinuses are clear. There is rightward deviation of the nasal septum. Orbits appear symmetric bilaterally. Other: Mastoid air cells are clear. IMPRESSION: Atrophy with fairly extensive supratentorial small vessel disease. No acute infarct. No hemorrhage, mass, or extra-axial fluid collection. There are foci of RT vascular calcification. There is mild mucosal thickening in several ethmoid air cells bilaterally. There is rightward deviation of the nasal septum. Electronically Signed   By: Lowella Grip III M.D.   On: 05/04/2016 13:16   Dg Chest Portable 1 View  Result Date: 05/04/2016 CLINICAL DATA:   Pain following fall EXAM: PORTABLE CHEST 1 VIEW COMPARISON:  None. FINDINGS: There is subtle ill-defined opacity in the right upper lobe medially. Lungs elsewhere are clear. Heart size and pulmonary vascularity are normal. No adenopathy. No bone lesions. No pneumothorax peer IMPRESSION: Subtle ill-defined opacity right upper lobe medially. Question early pneumonia in this area. Lungs elsewhere clear. Cardiac silhouette within normal limits. Followup PA and lateral chest radiographs recommended in 3-4 weeks following trial of antibiotic therapy to ensure resolution and exclude underlying malignancy. Electronically Signed   By: Lowella Grip III M.D.   On: 05/04/2016 11:56   Dg Hip Unilat With Pelvis Min 4 Views Left  Result Date: 05/04/2016 CLINICAL DATA:  Pain following fall EXAM: DG HIP (WITH OR WITHOUT PELVIS) 2+V LEFT COMPARISON:  None. FINDINGS: Frontal pelvis as well as frontal and lateral left hip images were obtained. There is a comminuted intertrochanteric femur fracture on the left with varus angulation of the fracture site. No other fractures are evident. No dislocation. There is mild symmetric narrowing of both hip joints. There is extensive calcification in both common and superficial femoral arteries. IMPRESSION: Comminuted intertrochanteric femur fracture on the left with varus angulation at the fracture site. No other fractures. No dislocations. Mild symmetric narrowing both hip joints. There is femoral artery atherosclerosis bilaterally. Electronically Signed   By: Lowella Grip III M.D.   On: 05/04/2016 11:58    Review of Systems  Musculoskeletal: Positive for joint pain.  All other systems reviewed and are negative.  Blood pressure (!) 163/87, pulse 83, temperature 98.3 F (36.8 C), temperature source Oral, resp. rate 15, SpO2 99 %. Physical Exam  Constitutional: She is oriented to person, place, and time. She appears well-developed and well-nourished.  HENT:  Head:  Normocephalic and atraumatic.  Eyes: EOM are normal. Pupils are equal, round, and reactive to light.  Neck: Normal range of motion. Neck supple.  Cardiovascular: Normal rate and regular rhythm.   Respiratory: Effort normal and breath sounds normal.  GI: Soft. Bowel sounds are normal.  Musculoskeletal:       Left hip: She exhibits decreased range of motion, decreased strength, tenderness, bony tenderness and deformity.  Neurological: She is alert and oriented to person, place, and time.  Skin: Skin is warm and dry.  Psychiatric: She has a normal mood and affect.   I independently reviewed her hip films and see that she has a displaced left hip intertrochanteric fracture.   Assessment/Plan:  Left hip with displaced intertrochanteric proximal femur fracture 1)  We plan to proceed to surgery today for surgical fixation of her left hip fracture using an intramedullary nail/hip screw.  Risks and benefits have been discussed in detail and informed consent is obtained.  She has been evaluated by [facility] for clearance for surgery.  Mcarthur Rossetti 05/04/2016, 2:11 PM

## 2016-05-05 DIAGNOSIS — F419 Anxiety disorder, unspecified: Secondary | ICD-10-CM

## 2016-05-05 LAB — COMPREHENSIVE METABOLIC PANEL
ALK PHOS: 45 U/L (ref 38–126)
ALT: 23 U/L (ref 14–54)
AST: 29 U/L (ref 15–41)
Albumin: 3.4 g/dL — ABNORMAL LOW (ref 3.5–5.0)
Anion gap: 8 (ref 5–15)
BUN: 16 mg/dL (ref 6–20)
CALCIUM: 8.5 mg/dL — AB (ref 8.9–10.3)
CO2: 26 mmol/L (ref 22–32)
CREATININE: 0.78 mg/dL (ref 0.44–1.00)
Chloride: 102 mmol/L (ref 101–111)
Glucose, Bld: 170 mg/dL — ABNORMAL HIGH (ref 65–99)
Potassium: 3.5 mmol/L (ref 3.5–5.1)
Sodium: 136 mmol/L (ref 135–145)
Total Bilirubin: 0.6 mg/dL (ref 0.3–1.2)
Total Protein: 5.5 g/dL — ABNORMAL LOW (ref 6.5–8.1)

## 2016-05-05 LAB — CBC
HCT: 28.1 % — ABNORMAL LOW (ref 36.0–46.0)
Hemoglobin: 9.9 g/dL — ABNORMAL LOW (ref 12.0–15.0)
MCH: 31.2 pg (ref 26.0–34.0)
MCHC: 35.2 g/dL (ref 30.0–36.0)
MCV: 88.6 fL (ref 78.0–100.0)
PLATELETS: 160 10*3/uL (ref 150–400)
RBC: 3.17 MIL/uL — AB (ref 3.87–5.11)
RDW: 13.6 % (ref 11.5–15.5)
WBC: 8.6 10*3/uL (ref 4.0–10.5)

## 2016-05-05 MED ORDER — DIAZEPAM 2 MG PO TABS: 2.0000 mg | ORAL_TABLET | Freq: Two times a day (BID) | ORAL | Status: DC | PRN

## 2016-05-05 MED ORDER — POLYSACCHARIDE IRON COMPLEX 150 MG PO CAPS
150.0000 mg | ORAL_CAPSULE | Freq: Every day | ORAL | Status: DC
  Administered 2016-05-05 – 2016-05-06 (×2): 150 mg via ORAL
  Filled 2016-05-05 (×2): qty 1

## 2016-05-05 MED ORDER — MORPHINE SULFATE (PF) 4 MG/ML IV SOLN: 0.5000 mg | INTRAVENOUS | Status: DC | PRN

## 2016-05-05 NOTE — Evaluation (Signed)
Physical Therapy Evaluation Patient Details Name: Brooke Collier MRN: [id number] DOB: Nov 13, 1935 Today's Date: 05/05/2016   History of Present Illness  81 yo female s/p IM nail for IT fx 05/04/16. Hx of HTN, anxiety  Clinical Impression  On eval, pt required Min-Mod assist for mobility. She walked ~30 feet with a RW. Pain rated 2/10 in L LE but pt c/o greater pain in bil UEs. Pt presents with general weakness, decreased activity tolerance, and impaired gait and balance. Recommend ST rehab at SNF to improve mobility and regain independence-pt is agreeable. Will follow.     Follow Up Recommendations SNF    Equipment Recommendations   (TBD at next venue)    Recommendations for Other Services       Precautions / Restrictions Precautions Precautions: Fall Restrictions Weight Bearing Restrictions: No LLE Weight Bearing: Weight bearing as tolerated      Mobility  Bed Mobility Overal bed mobility: Needs Assistance Bed Mobility: Supine to Sit     Supine to sit: HOB elevated;Mod assist     General bed mobility comments: Increased time. Assist for L LE and trunk. VCs safety, technique.   Transfers Overall transfer level: Needs assistance Equipment used: Rolling walker (2 wheeled) Transfers: Sit to/from Stand Sit to Stand: Min assist;From elevated surface         General transfer comment: Assist to rise, stabilize, control descent. VCs safety, technique, hand/LE placement.   Ambulation/Gait Ambulation/Gait assistance: Min assist Ambulation Distance (Feet): 30 Feet Assistive device: Rolling walker (2 wheeled) Gait Pattern/deviations: Step-to pattern     General Gait Details: VCs safety, technique, sequence, step length. Slow gait speed. Pt c/o UE pain, fatigue > L LE. Mild nausea.  Stairs            Wheelchair Mobility    Modified Rankin (Stroke Patients Only)       Balance Overall balance assessment: Needs assistance;History of Falls            Standing balance-Leahy Scale: Poor Standing balance comment: requring RW                             Pertinent Vitals/Pain Pain Assessment: Faces Faces Pain Scale: Hurts little more Pain Location: L LE with activity (2/10). However pt c/o more pain in bil UEs.  Pain Descriptors / Indicators: Aching;Sore Pain Intervention(s): Limited activity within patient's tolerance;Repositioned    Home Living Family/patient expects to be discharged to:: Skilled nursing facility Living Arrangements: Alone   Type of Home: House Home Access: Stairs to enter       Home Equipment: Environmental consultant - 2 wheels      Prior Function Level of Independence: Independent               Hand Dominance        Extremity/Trunk Assessment   Upper Extremity Assessment Upper Extremity Assessment: Generalized weakness (pt c/o pain in bil UEs)    Lower Extremity Assessment Lower Extremity Assessment: Generalized weakness (s/p L IM nail)    Cervical / Trunk Assessment Cervical / Trunk Assessment: Kyphotic  Communication   Communication: No difficulties  Cognition Arousal/Alertness: Awake/alert Behavior During Therapy: WFL for tasks assessed/performed Overall Cognitive Status: Within Functional Limits for tasks assessed  General Comments      Exercises     Assessment/Plan    PT Assessment Patient needs continued PT services  PT Problem List Decreased strength;Decreased mobility;Decreased balance;Decreased activity tolerance;Decreased knowledge of use of DME;Pain;Decreased range of motion       PT Treatment Interventions DME instruction;Therapeutic activities;Gait training;Therapeutic exercise;Patient/family education;Stair training;Balance training;Functional mobility training    PT Goals (Current goals can be found in the Care Plan section)  Acute Rehab PT Goals Patient Stated Goal: regain independence PT Goal Formulation:  With patient/family Time For Goal Achievement: 05/19/16 Potential to Achieve Goals: Good    Frequency Min 3X/week   Barriers to discharge        Co-evaluation               End of Session Equipment Utilized During Treatment: Gait belt Activity Tolerance: Patient tolerated treatment well Patient left: in chair;with call bell/phone within reach;with family/visitor present   PT Visit Diagnosis: Muscle weakness (generalized) (M62.81);Difficulty in walking, not elsewhere classified (R26.2)    Time: 1601-0932 PT Time Calculation (min) (ACUTE ONLY): 15 min   Charges:   PT Evaluation $PT Eval Low Complexity: 1 Procedure     PT G Codes:          Weston Anna, MPT Pager: [phone]

## 2016-05-05 NOTE — Progress Notes (Signed)
Subjective: 1 Day Post-Op Procedure(s) (LRB): INTRAMEDULLARY (IM) NAIL INTERTROCHANTRIC (Left) Patient reports pain as mild.    Objective: Vital signs in last 24 hours: Temp:  [96.3 F (35.7 C)-98.4 F (36.9 C)] 97.7 F (36.5 C) (03/25 0631) Pulse Rate:  [69-116] 78 (03/25 1014) Resp:  [11-17] 16 (03/25 0631) BP: (113-182)/(55-98) 113/55 (03/25 1014) SpO2:  [93 %-100 %] 100 % (03/25 0631) Weight:  [111 lb 15.9 oz (50.8 kg)] 111 lb 15.9 oz (50.8 kg) (03/24 1736)  Intake/Output from previous day: 03/24 0701 - 03/25 0700 In: 1200 [P.O.:300; I.V.:900] Out: 1370 [Urine:1270; Blood:100] Intake/Output this shift: Total I/O In: 240 [P.O.:240] Out: 300 [Urine:300]   Recent Labs  05/04/16 1111 05/05/16 0355  HGB 12.8 9.9*    Recent Labs  05/04/16 1111 05/05/16 0355  WBC 7.2 8.6  RBC 4.28 3.17*  HCT 38.7 28.1*  PLT 174 160    Recent Labs  05/04/16 1111 05/05/16 0355  NA 139 136  K 3.3* 3.5  CL 104 102  CO2 29 26  BUN 17 16  CREATININE 0.79 0.78  GLUCOSE 149* 170*  CALCIUM 8.8* 8.5*   No results for input(s): LABPT, INR in the last 72 hours.  Neurologically intact ABD soft Neurovascular intact Compartment soft Left hip dressings clean and dry,no calf pain Assessment/Plan: 1 Day Post-Op Procedure(s) (LRB): INTRAMEDULLARY (IM) NAIL INTERTROCHANTRIC (Left) Saline lock IV, OOB with PT-will need skilled care for rehab at discharge  Garald Balding 05/05/2016, 10:38 AM

## 2016-05-05 NOTE — Progress Notes (Signed)
qPhysical Therapy Treatment Patient Details Name: Brooke Collier MRN: [id number] DOB: May 29, 1935 Today's Date: 05/05/2016    History of Present Illness 81 yo female s/p IM nail for IT fx 05/04/16. Hx of HTN, anxiety    PT Comments    Pt sitting on commode in bathroom when therapist arrived. C/o L LE numbness from sitting on commode so long. Increased pain with ambulation this session so distance limited. Assisted pt back to bed. Continue to recommend SNF for rehab.     Follow Up Recommendations  SNF     Equipment Recommendations   (TBD at next venue)    Recommendations for Other Services       Precautions / Restrictions Precautions Precautions: Fall Restrictions Weight Bearing Restrictions: No LLE Weight Bearing: Weight bearing as tolerated    Mobility  Bed Mobility Overal bed mobility: Needs Assistance Bed Mobility: Sit to Supine      Sit to supine: Min assist;HOB elevated   General bed mobility comments: Increased time. Assist for L LE and trunk. VCs safety, technique.   Transfers Overall transfer level: Needs assistance Equipment used: Rolling walker (2 wheeled) Transfers: Sit to/from Stand Sit to Stand: Min assist         General transfer comment: NT assisted pt off of commode. Assist needed to control descent to elevated bed after short walk from bathroom to bed.   Ambulation/Gait Ambulation/Gait assistance: Min assist Ambulation Distance (Feet): 15 Feet Assistive device: Rolling walker (2 wheeled) Gait Pattern/deviations: Step-to pattern;Antalgic     General Gait Details: VCs safety, technique, sequence, step length. Slow gait speed. Increased pain this session so distance limited.    Stairs            Wheelchair Mobility    Modified Rankin (Stroke Patients Only)       Balance Overall balance assessment: Needs assistance;History of Falls           Standing balance-Leahy Scale: Poor Standing balance comment: requring RW                             Cognition Arousal/Alertness: Awake/alert Behavior During Therapy: WFL for tasks assessed/performed Overall Cognitive Status: Within Functional Limits for tasks assessed                                        Exercises      General Comments        Pertinent Vitals/Pain Pain Assessment: Faces Faces Pain Scale: Hurts even more Pain Location: L LE with activity. However pt c/o more pain in bil UEs.  Pain Descriptors / Indicators: Sore;Aching Pain Intervention(s): Limited activity within patient's tolerance;Repositioned    Home Living Family/patient expects to be discharged to:: Skilled nursing facility Living Arrangements: Alone   Type of Home: House Home Access: Stairs to enter     Home Equipment: Environmental consultant - 2 wheels      Prior Function Level of Independence: Independent          PT Goals (current goals can now be found in the care plan section) Acute Rehab PT Goals Patient Stated Goal: regain independence PT Goal Formulation: With patient/family Time For Goal Achievement: 05/19/16 Potential to Achieve Goals: Good Progress towards PT goals: Progressing toward goals    Frequency    Min 3X/week      PT Plan Current plan remains appropriate  Co-evaluation             End of Session Equipment Utilized During Treatment: Gait belt Activity Tolerance: Patient limited by pain Patient left: in bed;with call bell/phone within reach;with family/visitor present;with bed alarm set   PT Visit Diagnosis: Muscle weakness (generalized) (M62.81);Difficulty in walking, not elsewhere classified (R26.2)     Time: 9728-2060 PT Time Calculation (min) (ACUTE ONLY): 10 min  Charges:  $Gait Training: 8-22 mins                    G Codes:          Weston Anna, MPT Pager: [phone]

## 2016-05-05 NOTE — Progress Notes (Signed)
[facility] PROGRESS NOTE  Brooke Collier FTD:[id number] DOB: Oct 13, 1935 DOA: 05/04/2016 PCP: [name], MD  Interim summary and HPI 81 y.o. female with medical history significant of hypertension, anxiety, but otherwise healthy, presents to the emergency room with chief complaint of left hip pain after having a fall.  She was in her house today, doing some chores when she tripped and fell on the ground on her left side.  She denies any chest pains, she denies any syncope or passing out.  She never lost consciousness and has full recollection of her fall.  She denies any fever or chills.  She has had no abdominal pain, nausea or vomiting at home.  She is mildly nauseous in the ED after receiving pain medications.  She has been having intermittent palpitations over the last week, short-lived and resolving on their own.  She is usually independent, needs no assistance with activities of daily living and other than the hip pain she has no complaints.  Assessment/Plan: 1. Left hip fracture -s/p surgical repair (IM nail placed by ortho on 3/24) -continue supportive care, PRN analgesics and PT evaluation -will follow post operative rec;s from orthopedic service  2. HTN -BP is well controlled -will continue current antihypertensives regimen   3. HLD -will resume Co-Q 10 at discharge  4. Anxiety -stable -will continue PRN valium  5. Acute blood loss anemia -no need for transfusion currently -Hgb 9.9 -started on niferex -will follow Hgb trend    Code Status: Full code Family Communication: son at bedside  Disposition Plan: to be determined; needs to be seen by PT; will follow post operative rec's from orthopedic service and follow Hgb trend.   Consultants:  Orthopedic service   Procedures: INTRAMEDULLARY (IM) NAIL INTERTROCHANTRIC (Left) 3/24  Antibiotics:  None   HPI/Subjective: Complaining of nausea; no fever, no vomiting, no CP, no SOB. Reports pain in her hip is  mild currently.  Objective: Vitals:   05/05/16 1014 05/05/16 1045  BP: (!) 113/55 120/74  Pulse: 78 74  Resp:  15  Temp:  98.8 F (37.1 C)    Intake/Output Summary (Last 24 hours) at 05/05/16 1205 Last data filed at 05/05/16 1003  Gross per 24 hour  Intake             1440 ml  Output             1670 ml  Net             -230 ml   Filed Weights   05/04/16 1736  Weight: 50.8 kg (111 lb 15.9 oz)    Exam:   General:  Afebrile. No CP, no SOB and reports pain is mild. Patient having some nausea.   Cardiovascular: S1 and S2, no rubs, no gallops  Respiratory: CTA bilaterally  Abdomen: soft, NT, ND, positive BS  Musculoskeletal: no cyanosis, no clubbing; no calf pain and left hip dressing cleana nd dry.  Data Reviewed: Basic Metabolic Panel:  Recent Labs Lab 05/04/16 1111 05/05/16 0355  NA 139 136  K 3.3* 3.5  CL 104 102  CO2 29 26  GLUCOSE 149* 170*  BUN 17 16  CREATININE 0.79 0.78  CALCIUM 8.8* 8.5*   Liver Function Tests:  Recent Labs Lab 05/05/16 0355  AST 29  ALT 23  ALKPHOS 45  BILITOT 0.6  PROT 5.5*  ALBUMIN 3.4*   CBC:  Recent Labs Lab 05/04/16 1111 05/05/16 0355  WBC 7.2 8.6  HGB 12.8 9.9*  HCT 38.7  28.1*  MCV 90.4 88.6  PLT 174 160    Studies: Ct Head Wo Contrast  Result Date: 05/04/2016 CLINICAL DATA:  Pain following fall EXAM: CT HEAD WITHOUT CONTRAST TECHNIQUE: Contiguous axial images were obtained from the base of the skull through the vertex without intravenous contrast. COMPARISON:  None. FINDINGS: Brain: There is mild diffuse atrophy. There is no intracranial mass, hemorrhage, extra-axial fluid collection, or midline shift. There is small vessel disease throughout the centra semiovale bilaterally, most pronounced adjacent to the superior atria of the lateral ventricles bilaterally. Elsewhere gray-white compartments are normal. No acute infarct evident. Vascular: No hyperdense vessel. There is calcification in each carotid siphon  region. Skull: Bony calvarium appears intact, although there is osteoporosis. Sinuses/Orbits: There is mucosal thickening in several ethmoid air cells bilaterally. Other visualized paranasal sinuses are clear. There is rightward deviation of the nasal septum. Orbits appear symmetric bilaterally. Other: Mastoid air cells are clear. IMPRESSION: Atrophy with fairly extensive supratentorial small vessel disease. No acute infarct. No hemorrhage, mass, or extra-axial fluid collection. There are foci of RT vascular calcification. There is mild mucosal thickening in several ethmoid air cells bilaterally. There is rightward deviation of the nasal septum. Electronically Signed   By: Lowella Grip III M.D.   On: 05/04/2016 13:16   Dg Chest Portable 1 View  Result Date: 05/04/2016 CLINICAL DATA:  Pain following fall EXAM: PORTABLE CHEST 1 VIEW COMPARISON:  None. FINDINGS: There is subtle ill-defined opacity in the right upper lobe medially. Lungs elsewhere are clear. Heart size and pulmonary vascularity are normal. No adenopathy. No bone lesions. No pneumothorax peer IMPRESSION: Subtle ill-defined opacity right upper lobe medially. Question early pneumonia in this area. Lungs elsewhere clear. Cardiac silhouette within normal limits. Followup PA and lateral chest radiographs recommended in 3-4 weeks following trial of antibiotic therapy to ensure resolution and exclude underlying malignancy. Electronically Signed   By: Lowella Grip III M.D.   On: 05/04/2016 11:56   Dg C-arm 1-60 Min-no Report  Result Date: 05/04/2016 Fluoroscopy was utilized by the requesting physician.  No radiographic interpretation.   Dg Hip Operative Unilat W Or W/o Pelvis Left  Result Date: 05/04/2016 CLINICAL DATA:  IM nail for left hip fx; 1 min 12 sec fluoro time EXAM: OPERATIVE LEFT HIP (WITH PELVIS IF PERFORMED) 5 VIEWS TECHNIQUE: Fluoroscopic spot image(s) were submitted for interpretation post-operatively. COMPARISON:  05/04/2016  FINDINGS: Fluoroscopic images are submitted, demonstrating intramedullary nail/ lag screw fixation of the intertrochanteric fracture of left hip. No interval fractures identified. IMPRESSION: Status post IM nail fixation of left hip fracture. No adverse features identified Electronically Signed   By: Nolon Nations M.D.   On: 05/04/2016 17:19   Dg Hip Unilat With Pelvis Min 4 Views Left  Result Date: 05/04/2016 CLINICAL DATA:  Pain following fall EXAM: DG HIP (WITH OR WITHOUT PELVIS) 2+V LEFT COMPARISON:  None. FINDINGS: Frontal pelvis as well as frontal and lateral left hip images were obtained. There is a comminuted intertrochanteric femur fracture on the left with varus angulation of the fracture site. No other fractures are evident. No dislocation. There is mild symmetric narrowing of both hip joints. There is extensive calcification in both common and superficial femoral arteries. IMPRESSION: Comminuted intertrochanteric femur fracture on the left with varus angulation at the fracture site. No other fractures. No dislocations. Mild symmetric narrowing both hip joints. There is femoral artery atherosclerosis bilaterally. Electronically Signed   By: Lowella Grip III M.D.   On: 05/04/2016 11:58  Scheduled Meds: . aspirin EC  325 mg Oral Q breakfast  . docusate sodium  100 mg Oral BID  . irbesartan  150 mg Oral Daily  . iron polysaccharides  150 mg Oral Daily   Continuous Infusions: . lactated ringers      Active Problems:   HLD (hyperlipidemia)   Essential hypertension   Hip fracture ([place])    Time spent: 25 minutes    Barton Dubois  [facility] Pager [phone]. If 7PM-7AM, please contact night-coverage at [email or web address], password [email or web address] 05/05/2016, 12:05 PM  LOS: 1 day

## 2016-05-06 ENCOUNTER — Encounter (HOSPITAL_COMMUNITY): Payer: Self-pay | Admitting: Orthopaedic Surgery

## 2016-05-06 DIAGNOSIS — I10 Essential (primary) hypertension: Secondary | ICD-10-CM

## 2016-05-06 DIAGNOSIS — E785 Hyperlipidemia, unspecified: Secondary | ICD-10-CM

## 2016-05-06 DIAGNOSIS — D62 Acute posthemorrhagic anemia: Secondary | ICD-10-CM

## 2016-05-06 DIAGNOSIS — E43 Unspecified severe protein-calorie malnutrition: Secondary | ICD-10-CM | POA: Insufficient documentation

## 2016-05-06 DIAGNOSIS — S72002D Fracture of unspecified part of neck of left femur, subsequent encounter for closed fracture with routine healing: Secondary | ICD-10-CM

## 2016-05-06 LAB — BASIC METABOLIC PANEL
ANION GAP: 4 — AB (ref 5–15)
BUN: 22 mg/dL — ABNORMAL HIGH (ref 6–20)
CALCIUM: 8.1 mg/dL — AB (ref 8.9–10.3)
CO2: 28 mmol/L (ref 22–32)
CREATININE: 0.76 mg/dL (ref 0.44–1.00)
Chloride: 105 mmol/L (ref 101–111)
GFR calc Af Amer: >60 mL/min (ref >60)
GFR calc non Af Amer: >60 mL/min (ref >60)
Glucose, Bld: 98 mg/dL (ref 65–99)
Potassium: 3.6 mmol/L (ref 3.5–5.1)
SODIUM: 137 mmol/L (ref 135–145)

## 2016-05-06 LAB — CBC
HEMATOCRIT: 25.2 % — AB (ref 36.0–46.0)
HEMOGLOBIN: 8.7 g/dL — AB (ref 12.0–15.0)
MCH: 30.9 pg (ref 26.0–34.0)
MCHC: 34.5 g/dL (ref 30.0–36.0)
MCV: 89.4 fL (ref 78.0–100.0)
Platelets: 139 10*3/uL — ABNORMAL LOW (ref 150–400)
RBC: 2.82 MIL/uL — ABNORMAL LOW (ref 3.87–5.11)
RDW: 14.1 % (ref 11.5–15.5)
WBC: 7.3 10*3/uL (ref 4.0–10.5)

## 2016-05-06 MED ORDER — ASPIRIN 325 MG PO TBEC: 325.0000 mg | DELAYED_RELEASE_TABLET | Freq: Every day | ORAL | 0 refills | Status: DC

## 2016-05-06 MED ORDER — POLYSACCHARIDE IRON COMPLEX 150 MG PO CAPS: 150.0000 mg | ORAL_CAPSULE | Freq: Every day | ORAL | Status: DC

## 2016-05-06 MED ORDER — ACETAMINOPHEN 325 MG PO TABS: 650.0000 mg | ORAL_TABLET | Freq: Four times a day (QID) | ORAL | Status: AC | PRN

## 2016-05-06 MED ORDER — ENSURE ENLIVE PO LIQD
237.0000 mL | Freq: Two times a day (BID) | ORAL | Status: DC
  Administered 2016-05-06: 237 mL via ORAL

## 2016-05-06 MED ORDER — ADULT MULTIVITAMIN W/MINERALS CH
1.0000 | ORAL_TABLET | Freq: Every day | ORAL | Status: DC
  Administered 2016-05-06: 14:00:00 1 via ORAL
  Filled 2016-05-06: qty 1

## 2016-05-06 MED ORDER — ENSURE ENLIVE PO LIQD: 237.0000 mL | Freq: Two times a day (BID) | ORAL | Status: DC

## 2016-05-06 MED ORDER — POLYETHYLENE GLYCOL 3350 17 G PO PACK: 17.0000 g | PACK | Freq: Every day | ORAL | Status: DC

## 2016-05-06 MED ORDER — OXYCODONE-ACETAMINOPHEN 5-325 MG PO TABS: 1.0000 | ORAL_TABLET | ORAL | 0 refills | Status: DC | PRN

## 2016-05-06 MED ORDER — DOCUSATE SODIUM 100 MG PO CAPS: 100.0000 mg | ORAL_CAPSULE | Freq: Two times a day (BID) | ORAL | Status: DC

## 2016-05-06 NOTE — NC FL2 (Signed)
[place] LEVEL OF CARE SCREENING TOOL     IDENTIFICATION  Patient Name: Brooke Collier Birthdate: 1935-07-20 Sex: female Admission Date (Current Location): 05/04/2016  [facility] and [place] Number:  Herbalist and Address:  [facility],  [place] [address], [place]      Provider Number: [id number]  Attending Physician Name and Address:  Barton Dubois, MD  Relative Name and Phone Number:       Current Level of Care: Hospital Recommended Level of Care: [place] Prior Approval Number:    Date Approved/Denied:   PASRR Number: [id number]  Discharge Plan: SNF    Current Diagnoses: Patient Active Problem List   Diagnosis Date Noted  . Hip fracture ([place]) 05/04/2016  . Closed comminuted intertrochanteric fracture of proximal end of left femur ([place])   . HYPOTHYROIDISM 11/17/2008  . UNSPECIFIED VITAMIN D DEFICIENCY 11/17/2008  . HLD (hyperlipidemia) 11/17/2008  . ANEMIA 11/17/2008  . 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 Essential hypertension 07/21/2006  . ALLERGIC RHINITIS 07/21/2006  . ARTHRITIS 07/21/2006    Orientation RESPIRATION BLADDER Height & Weight     Self, Time, Situation, Place  Normal Continent Weight: 111 lb 15.9 oz (50.8 kg) Height:  5\' 5"  (165.1 cm)  BEHAVIORAL SYMPTOMS/MOOD NEUROLOGICAL BOWEL NUTRITION STATUS  Other (Comment) (no behavior)   Continent Diet  AMBULATORY STATUS COMMUNICATION OF NEEDS Skin   Limited Assist Verbally Surgical wounds                       Personal Care Assistance Level of Assistance  Bathing, Feeding, Dressing Bathing Assistance: Limited assistance Feeding assistance: Independent Dressing Assistance: Limited assistance     Functional Limitations Info  Sight, Hearing, Speech Sight Info: Adequate Hearing Info: Impaired Speech Info: Adequate    SPECIAL CARE FACTORS FREQUENCY  PT (By licensed PT), OT (By licensed OT)     PT Frequency: 5-6 x  wk OT Frequency: 5-6 x wk            Contractures Contractures Info: Not present    Additional Factors Info  Code Status Code Status Info: Full Code             Current Medications (05/06/2016):  This is the current hospital active medication list Current Facility-Administered Medications  Medication Dose Route Frequency Provider Last Rate Last Dose  . acetaminophen (TYLENOL) tablet 650 mg  650 mg Oral Q6H PRN Mcarthur Rossetti, MD   650 mg at 05/06/16 2458   Or  . acetaminophen (TYLENOL) suppository 650 mg  650 mg Rectal Q6H PRN Mcarthur Rossetti, MD      . aspirin EC tablet 325 mg  325 mg Oral Q breakfast Mcarthur Rossetti, MD   325 mg at 05/06/16 0925  . diazepam (VALIUM) tablet 2 mg  2 mg Oral Q12H PRN Barton Dubois, MD      . docusate sodium (COLACE) capsule 100 mg  100 mg Oral BID Mcarthur Rossetti, MD   100 mg at 05/06/16 0998  . hydrALAZINE (APRESOLINE) injection 5 mg  5 mg Intravenous Q4H PRN [place]in Karlyne Greenspan, MD      . HYDROcodone-acetaminophen (NORCO/VICODIN) 5-325 MG per tablet 1-2 tablet  1-2 tablet Oral Q6H PRN Mcarthur Rossetti, MD      . irbesartan (AVAPRO) tablet 150 mg  150 mg Oral Daily Caren Griffins, MD   150 mg at 05/06/16 0925  . iron polysaccharides (NIFEREX) capsule 150  mg  150 mg Oral Daily Barton Dubois, MD   150 mg at 05/06/16 0925  . ketorolac (TORADOL) 15 MG/ML injection 15 mg  15 mg Intravenous Q6H PRN Caren Griffins, MD   15 mg at 05/05/16 0807  . lactated ringers infusion   Intravenous Continuous Myrtie Soman, MD   Stopped at 05/06/16 0900  . menthol-cetylpyridinium (CEPACOL) lozenge 3 mg  1 lozenge Oral PRN Mcarthur Rossetti, MD       Or  . phenol (CHLORASEPTIC) mouth spray 1 spray  1 spray Mouth/Throat PRN Mcarthur Rossetti, MD      . methocarbamol (ROBAXIN) tablet 500 mg  500 mg Oral Q6H PRN Mcarthur Rossetti, MD       Or  . methocarbamol (ROBAXIN) 500 mg in dextrose 5 % 50 mL IVPB  500 mg Intravenous  Q6H PRN Mcarthur Rossetti, MD      . metoCLOPramide (REGLAN) tablet 5-10 mg  5-10 mg Oral Q8H PRN Mcarthur Rossetti, MD       Or  . metoCLOPramide (REGLAN) injection 5-10 mg  5-10 mg Intravenous Q8H PRN Mcarthur Rossetti, MD   10 mg at 05/04/16 1815  . morphine 4 MG/ML injection 0.52 mg  0.52 mg Intravenous Q4H PRN Barton Dubois, MD      . ondansetron [facility]) tablet 4 mg  4 mg Oral Q6H PRN Mcarthur Rossetti, MD       Or  . ondansetron [facility]) injection 4 mg  4 mg Intravenous Q6H PRN Mcarthur Rossetti, MD   4 mg at 05/05/16 0806  . traMADol (ULTRAM) tablet 100 mg  100 mg Oral Q6H PRN Mcarthur Rossetti, MD         Discharge Medications: Please see discharge summary for a list of discharge medications.  Relevant Imaging Results:  Relevant Lab Results:   Additional Information SS # [id number]  Velva Molinari, Randall An, LCSW

## 2016-05-06 NOTE — Progress Notes (Signed)
qPhysical Therapy Treatment Patient Details Name: Brooke Collier MRN: [id number] DOB: 09/26/1935 Today's Date: 05/06/2016    History of Present Illness 81 yo female s/p IM nail for IT fx 05/04/16. Hx of HTN, anxiety    PT Comments    The patient C/O mild nausea and  A little weak. Noted HGB 8.7. Did ambulate farther distance today. Plans SNF    Follow Up Recommendations  SNF     Equipment Recommendations    none   Recommendations for Other Services       Precautions / Restrictions Precautions Precautions: Fall Restrictions Weight Bearing Restrictions: No LLE Weight Bearing: Weight bearing as tolerated    Mobility  Bed Mobility Overal bed mobility: Needs Assistance             General bed mobility comments: pt in chair  Transfers Overall transfer level: Needs assistance Equipment used: Rolling walker (2 wheeled) Transfers: Sit to/from Stand Sit to Stand: Min assist;Mod assist         General transfer comment: Assist to rise, stabilize, control descent. VCs safety, technique, hand/LE placement.   Ambulation/Gait   Ambulation Distance (Feet): 70 Feet Assistive device: Rolling walker (2 wheeled) Gait Pattern/deviations: Step-to pattern;Step-through pattern     General Gait Details: VCs safety, technique, sequence, step length. upright postural cues.   Stairs            Wheelchair Mobility    Modified Rankin (Stroke Patients Only)       Balance                                            Cognition Arousal/Alertness: Awake/alert Behavior During Therapy: WFL for tasks assessed/performed Overall Cognitive Status: Within Functional Limits for tasks assessed                                        Exercises      General Comments        Pertinent Vitals/Pain Pain Score: 5  Pain Location: L hip area Pain Descriptors / Indicators: Sore Pain Intervention(s): Premedicated before session;Ice applied     Home Living Family/patient expects to be discharged to:: Skilled nursing facility Living Arrangements: Alone   Type of Home: House Home Access: Stairs to enter     Home Equipment: Environmental consultant - 2 wheels      Prior Function Level of Independence: Independent          PT Goals (current goals can now be found in the care plan section) Acute Rehab PT Goals Patient Stated Goal: regain independence Progress towards PT goals: Progressing toward goals    Frequency    Min 3X/week      PT Plan Current plan remains appropriate    Co-evaluation             End of Session   Activity Tolerance: Patient tolerated treatment well Patient left: in chair;with call bell/phone within reach;with family/visitor present Nurse Communication: Mobility status PT Visit Diagnosis: Muscle weakness (generalized) (M62.81);Difficulty in walking, not elsewhere classified (R26.2)     Time: 6063-0160 PT Time Calculation (min) (ACUTE ONLY): 33 min  Charges:  $Gait Training: 8-22 mins $Self Care/Home Management: 8-22  G CodesTresa Endo PT [phone]   Claretha Cooper 05/06/2016, 12:22 PM

## 2016-05-06 NOTE — Progress Notes (Signed)
Patient is able to turn and reposition self.

## 2016-05-06 NOTE — Progress Notes (Signed)
Initial Nutrition Assessment  DOCUMENTATION CODES:   Severe malnutrition in context of chronic illness  INTERVENTION:   Ensure Enlive po BID, each supplement provides 350 kcal and 20 grams of protein  MVI  NUTRITION DIAGNOSIS:   Malnutrition related to other (see comment), acute illness (food avoidance ) as evidenced by per patient/family report, severe depletion of body fat, severe depletion of muscle mass, 10 percent weight loss in 1 month.  GOAL:   Patient will meet greater than or equal to 90% of their needs  MONITOR:   PO intake, Supplement acceptance, Labs, Weight trends  REASON FOR ASSESSMENT:   Other (Comment) (Low BMI)    ASSESSMENT:   81 y.o. female with medical history significant of hypertension, anxiety, but otherwise healthy, presents to the emergency room with chief complaint of left hip pain after having a fall.  Admitted for L hip fracture now s/p surgical repair (IM nail placed by ortho on 3/24)   Met with pt and pt's son in room today. Pt reports good appetite pta and currently eating 100% of meals in hospital. Pt is underweight for her age and with low BMI. Per chart, pt has lost 13lbs(10%) over the past month which is severe. Pt reports that she avoids foods with gluten, lactose, and any additives or preservatives. Pt is not sensitive to gluten or lactose. After reviewing pt's dietary recall, RD is concerned that pt may be overrestricting herself by trying to avoid foods that she feels are unhealthy, therefore causing her to restrict calories and protein. RD discussed with pt the importance of adequate protein intake for her hip to heal as well as to preserve lean muscle mass. Pt reports that she knows she needs to gain weight but that she just can't eat most foods. Pt does not have celiac disease; RD discussed with pt that it is not healthy to restrict gluten containing products unless there is a gluten sensitivity. RD respects pt's wishes to avoid additives and  preservatives; RD provided some examples of ways to get protein and energy without preservatives. Pt is willing to try Ensure; RD will order. Goal is for patient to try and drink two Ensure per day. RD will continue to follow for nutritional needs.    Medications reviewed and include: aspirin, colace, LRS  Labs reviewed: BUN 22(H), Ca 8.1(L) adj. 8.58 (L), alb 3.4(L) Hgb 8.7(L), Hct 25.2(L)  Nutrition-Focused physical exam completed. Findings are severe fat depletion, severe muscle depletion, and no edema.   Diet Order:  Diet regular Room service appropriate? Yes; Fluid consistency: Thin  Skin:  Wound (see comment) (hip incision )  Last BM:  3/23  Height:   Ht Readings from Last 1 Encounters:  05/04/16 _0  (1.651 m)    Weight:   Wt Readings from Last 1 Encounters:  05/04/16 111 lb 15.9 oz (50.8 kg)    Ideal Body Weight:  56.8 kg  BMI:  Body mass index is 18.64 kg/m.  Estimated Nutritional Needs:   Kcal:  1400-1600kcal/day   Protein:  76-86g/day   Fluid:  >1.4L/day   EDUCATION NEEDS:   No education needs identified at this time  Koleen Distance, RD, LDN Pager #[phone]

## 2016-05-06 NOTE — Progress Notes (Signed)
Patient being transported to [facility]. Report given to the nurse at the facility.

## 2016-05-06 NOTE — Evaluation (Signed)
Occupational Therapy Evaluation Patient Details Name: Brooke Collier MRN: [id number] DOB: 1935/08/08 Today's Date: 05/06/2016    History of Present Illness 81 yo female s/p IM nail for IT fx 05/04/16. Hx of HTN, anxiety   Clinical Impression   Pt admitted with fall and had IM nail RHip. Pt currently with functional limitations due to the deficits listed below (see OT Problem List). Pt will benefit from skilled OT to increase their safety and independence with ADL and functional mobility for ADL to facilitate discharge to venue listed below.      Follow Up Recommendations  SNF    Equipment Recommendations  None recommended by OT    Recommendations for Other Services       Precautions / Restrictions Precautions Precautions: Fall Restrictions Weight Bearing Restrictions: No LLE Weight Bearing: Weight bearing as tolerated      Mobility Bed Mobility Overal bed mobility: Needs Assistance             General bed mobility comments: pt in chair  Transfers Overall transfer level: Needs assistance   Transfers: Sit to/from Stand Sit to Stand: Min assist;Mod assist         General transfer comment: Assist to rise, stabilize, control descent. VCs safety, technique, hand/LE placement.         ADL either performed or assessed with clinical judgement   ADL Overall ADL's : Needs assistance/impaired Eating/Feeding: Set up;Sitting   Grooming: Set up;Sitting   Upper Body Bathing: Set up;Sitting   Lower Body Bathing: Maximal assistance;Sit to/from stand;Cueing for safety;Cueing for sequencing;Cueing for compensatory techniques   Upper Body Dressing : Set up;Sitting   Lower Body Dressing: Maximal assistance;Sit to/from stand;Cueing for safety;Cueing for sequencing;Cueing for compensatory techniques   Toilet Transfer: RW;BSC;Moderate assistance   Toileting- Clothing Manipulation and Hygiene: Moderate assistance;Sit to/from stand;Cueing for safety;Cueing for  sequencing                            Pertinent Vitals/Pain Pain Score: 5  Pain Location: L hip area Pain Descriptors / Indicators: Sore Pain Intervention(s): Repositioned;Monitored during session;Limited activity within patient's tolerance     Hand Dominance     Extremity/Trunk Assessment Upper Extremity Assessment Upper Extremity Assessment: Generalized weakness           Communication Communication Communication: No difficulties   Cognition Arousal/Alertness: Awake/alert Behavior During Therapy: WFL for tasks assessed/performed Overall Cognitive Status: Within Functional Limits for tasks assessed                                                Home Living Family/patient expects to be discharged to:: Skilled nursing facility Living Arrangements: Alone   Type of Home: House Home Access: Stairs to enter                     Home Equipment: Environmental consultant - 2 wheels          Prior Functioning/Environment Level of Independence: Independent                 OT Problem List: Decreased strength;Decreased activity tolerance;Pain      OT Treatment/Interventions: Self-care/ADL training;DME and/or AE instruction;Patient/family education    OT Goals(Current goals can be found in the care plan section) Acute Rehab OT Goals Patient Stated Goal: regain independence OT Goal  Formulation: With patient Time For Goal Achievement: 05/20/16  OT Frequency: Min 2X/week              End of Session Equipment Utilized During Treatment: Surveyor, mining Communication: Mobility status  Activity Tolerance: Patient tolerated treatment well Patient left: in chair  OT Visit Diagnosis: Muscle weakness (generalized) (M62.81);Pain;Unsteadiness on feet (R26.81)                Time: 9276-3943 OT Time Calculation (min): 14 min Charges:  OT General Charges $OT Visit: 1 Procedure OT Evaluation $OT Eval Moderate Complexity: 1 Procedure G-Codes:      Kari Baars, OT [phone]  Payton Mccallum D 05/06/2016, 11:04 AM

## 2016-05-06 NOTE — Op Note (Signed)
NAME:  Brooke Collier, Brooke Collier                ACCOUNT NO.:  MEDICAL RECORD NO.:  [id number]  LOCATION:                                 FACILITY:  PHYSICIAN:  Lind Guest. [name], M.D.DATE OF BIRTH:  DATE OF PROCEDURE:  05/04/2016 DATE OF DISCHARGE:                              OPERATIVE REPORT   PREOPERATIVE DIAGNOSIS:  Left hip displaced intertrochanteric proximal femur fracture.  POSTOPERATIVE DIAGNOSIS:  Left hip displaced intertrochanteric proximal femur fracture.  PROCEDURE:  Open reduction and internal fixation of left intertrochanteric hip fracture using intramedullary rod and hip screw construct.  IMPLANTS:  Synthes TFN with a 9 x 360 femoral nail and a 90 mm lag screw.  SURGEON:  Lind Guest. [name], M.D.  ANESTHESIA:  General.  ANTIBIOTICS:  2 g of IV Ancef.  BLOOD LOSS:  100 mL.  COMPLICATIONS:  None.  INDICATIONS:  The patient is a very pleasant 81 year old female, who sustained a mechanical fall accidentally earlier today at home.  She just got tripped up.  There was no loss of consciousness or syncopal episode.  She landed directly on her left hip and had the inability to ambulate following this.  She was transferred to the [facility] Emergency Room and found to have intertrochanteric hip fracture.  This was of the left hip.  She was admitted to the Medicine Service and clearance was obtained for surgery.  I talked to her and her family in detail about the reasoning behind proceeding with surgery as well as a thorough discussion of risks and benefits of surgery.  She did wish to proceed.  PROCEDURE DESCRIPTION:  After informed consent was obtained, appropriate left hip was marked.  She was brought to the operating room and general anesthesia obtained while she was on her stretcher.  She was then placed supine on the fracture table with the perineal post in place.  Her left operative leg in inline skeletal traction and the right hip placed  in abduction stirrup with flexion and abduction and appropriate padding in the popliteal region.  We then assessed her left hip under direct fluoroscopy and obtained an adequate reduction under direct visualization and fluoroscopy.  We were able to then choose our Synthes 9 x 360 femoral nail keeping it sterile in the box and placing it over the leg under direct fluoroscopy.  We then passed this off the back table to be opened sterilely.  The left hip was then prepped and draped with DuraPrep and sterile drapes.  Time-out was called, and she was identified as correct patient and correct left hip.  We then made an incision just proximal to the greater trochanter and dissected down to the tip of greater trochanter.  We used a temporary guidepin to enter the femoral canal and then initiating a reamer to open the femoral canal.  We then placed our 9 x 360 femoral nail down the canal without difficulty and removed the guide pin.  We then used the outrigger guide to make a separate lateral incision.  We were able to place a guidepin from the lateral cortex of the femur traversing the fracture through the center position of the femoral neck into the femoral head  in adequate position.  We took a measurement off this and then drilled for the depth of 90 mm lag screw.  We placed the lag screw without difficulty and then let some traction off the leg and placed a compression component.  We then locked the nail from the top and removed the outrigger guide and all instrumentation.  We put the hip through internal and external rotation under direct fluoroscopy and it moved as a unit.  We then irrigated 2 small wounds with normal saline solution.  We closed the deep tissue with 0 Vicryl followed by 2-0 Vicryl in subcutaneous tissue and interrupted staples on the skin.  Xeroform and well-padded sterile dressing were applied.  She was awakened, extubated, and taken to the recovery room in stable  condition.  All final counts were correct. There were no complications noted.     Lind Guest. [name], M.D.     CYB/MEDQ  D:  05/04/2016  T:  05/04/2016  Job:  [id number]

## 2016-05-06 NOTE — Discharge Summary (Signed)
Physician Discharge Summary  Brooke Collier BWG:[id number] DOB: 1935/08/12 DOA: 05/04/2016  PCP: [name], MD  Admit date: 05/04/2016 Discharge date: 05/06/2016  Time spent: 35 minutes  Recommendations for Outpatient Follow-up:  Maintain adequate hydration Take medications as prescribed  Weight bearing as tolerated (no restriction) Follow up with Dr. [name] in 2 weeks Repeat CBC in 5 days to follow electrolytes and renal function  Discharge Diagnoses:  Active Problems:   HLD (hyperlipidemia)   Essential hypertension   Hip fracture (HCC)   Protein-calorie malnutrition, severe   Postoperative anemia due to acute blood loss   Discharge Condition: stable and improved. Discharge to SNF for physical rehabilitation and further care.  Diet recommendation: heart healthy  Filed Weights   05/04/16 1736  Weight: 50.8 kg (111 lb 15.9 oz)    History of present illness:  Brooke y.o.femalewith medical history significant of hypertension, anxiety, but otherwise healthy, presents to the emergency room with chief complaint of left hip pain after having a fall. She was in her house today, doing some chores when she tripped and fell on the ground on her left side. She denies any chest pains, she denies any syncope or passing out. She never lost consciousness and has full recollection of her fall. She denies any fever or chills. She has had no abdominal pain, nausea or vomiting at home. She is mildly nauseous in the ED after receiving pain medications. She has been having intermittent palpitations over the last week, short-lived and resolving on their own. She is usually independent, needs no assistance with activities of daily living and other than the hip pain she has no complaints.  Hospital Course:  1. Left hip fracture -s/p surgical repair (IM nail placed by ortho on 3/24) -continue supportive care, PRN analgesics and PT rehabilitation at SNF -will follow in 2 weeks with Dr.  [name] -full weight bearing as tolerated -ASA for prophylaxis   2. HTN -BP is well controlled -will continue home antihypertensives regimen and heart healthy diet   3. HLD -will resume Co-Q 10  -continue heart healthy diet   4. Anxiety -stable -not on any meds at home currently -in the past has used PRN valium  5. Acute blood loss anemia -no need for transfusion currently -Hgb 8.7 -started on niferex -follow Hgb trend   6. Severe protein calorie malnutrition  -will follow feeding supplements as recommended by nutritional service    Procedures: INTRAMEDULLARY (IM) NAIL INTERTROCHANTRIC (Left) 3/24  Consultations:  Orthopedic service   Discharge Exam: Vitals:   05/06/16 0509 05/06/16 1300  BP: (!) 132/57 119/69  Pulse: 71 85  Resp: 16 16  Temp: 98.8 F (37.1 C) 98.1 F (36.7 C)    General:  Afebrile. No CP, no SOB and reports pain is mild. Patient having some intermittent nausea, but overall much improved; no vomiting and tolerating diet/PO meds..   Cardiovascular: S1 and S2, no rubs, no gallops  Respiratory: CTA bilaterally  Abdomen: soft, NT, ND, positive BS  Musculoskeletal: no cyanosis, no clubbing; no calf pain and left hip dressing clean and dry.   Discharge Instructions   Discharge Instructions    Diet - low sodium heart healthy    Complete by:  As directed    Discharge instructions    Complete by:  As directed    Maintain adequate hydration Take medications as prescribed  Weight bearing as tolerated (no restriction) Follow up with Dr. [name] in 2 weeks Repeat CBC in 5 days to follow electrolytes and  renal function   Full weight bearing    Complete by:  As directed      Current Discharge Medication List    START taking these medications   Details  acetaminophen (TYLENOL) 325 MG tablet Take 2 tablets (650 mg total) by mouth every 6 (six) hours as needed (Pain, Hadache or Fever >/= 101).    aspirin EC 325 MG EC tablet Take 1  tablet (325 mg total) by mouth daily with breakfast. Qty: 30 tablet, Refills: 0    docusate sodium (COLACE) 100 MG capsule Take 1 capsule (100 mg total) by mouth 2 (two) times daily.    feeding supplement, ENSURE ENLIVE, (ENSURE ENLIVE) LIQD Take 237 mLs by mouth 2 (two) times daily between meals.    iron polysaccharides (NIFEREX) 150 MG capsule Take 1 capsule (150 mg total) by mouth daily.    oxyCODONE-acetaminophen (ROXICET) 5-325 MG tablet Take 1-2 tablets by mouth every 4 (four) hours as needed. Qty: 60 tablet, Refills: 0    polyethylene glycol (MIRALAX / GLYCOLAX) packet Take 17 g by mouth daily. Hold for diarrhea      CONTINUE these medications which have NOT CHANGED   Details  amLODipine (NORVASC) 2.5 MG tablet Take 2.5 mg by mouth daily.    BENICAR 40 MG tablet TAKE 1 TABLET BY MOUTH EVERY DAY Qty: 30 tablet, Refills: 3    Calcium Carb-Cholecalciferol (CALCIUM-VITAMIN D) 500-200 MG-UNIT tablet Take 1 tablet by mouth daily.    Calcium Carbonate (CALCIUM 500 PO) Take by mouth daily.      Co-Enzyme Q-10 30 MG CAPS Take 30 mg by mouth daily.    Ergocalciferol 2000 units TABS Take 2,000 Units by mouth daily.    QUERCETIN PO Take 1 g by mouth daily.      STOP taking these medications     UNABLE TO FIND      diazepam (VALIUM) 5 MG tablet      metoprolol succinate (TOPROL-XL) 50 MG 24 hr tablet        Allergies  Allergen Reactions  . Ciprofloxacin     Muscle weakness   Follow-up Information    Mcarthur Rossetti, MD. Schedule an appointment as soon as possible for a visit in 2 week(s).   Specialty:  Orthopedic Surgery Contact information: [place] [address] [phone]        [name], MD. Schedule an appointment as soon as possible for a visit in 2 week(s).   Specialty:  Family Medicine Why:  after been discharge from SNF Contact information: [place] [address] [phone]            The results of significant diagnostics from this hospitalization (including imaging, microbiology, ancillary and laboratory) are listed below for reference.    Significant Diagnostic Studies: Ct Head Wo Contrast  Result Date: 05/04/2016 CLINICAL DATA:  Pain following fall EXAM: CT HEAD WITHOUT CONTRAST TECHNIQUE: Contiguous axial images were obtained from the base of the skull through the vertex without intravenous contrast. COMPARISON:  None. FINDINGS: Brain: There is mild diffuse atrophy. There is no intracranial mass, hemorrhage, extra-axial fluid collection, or midline shift. There is small vessel disease throughout the centra semiovale bilaterally, most pronounced adjacent to the superior atria of the lateral ventricles bilaterally. Elsewhere gray-white compartments are normal. No acute infarct evident. Vascular: No hyperdense vessel. There is calcification in each carotid siphon region. Skull: Bony calvarium appears intact, although there is osteoporosis. Sinuses/Orbits: There is mucosal thickening in  several ethmoid air cells bilaterally. Other visualized paranasal sinuses are clear. There is rightward deviation of the nasal septum. Orbits appear symmetric bilaterally. Other: Mastoid air cells are clear. IMPRESSION: Atrophy with fairly extensive supratentorial small vessel disease. No acute infarct. No hemorrhage, mass, or extra-axial fluid collection. There are foci of RT vascular calcification. There is mild mucosal thickening in several ethmoid air cells bilaterally. There is rightward deviation of the nasal septum. Electronically Signed   By: Lowella Grip III M.D.   On: 05/04/2016 13:16   Dg Chest Portable 1 View  Result Date: 05/04/2016 CLINICAL DATA:  Pain following fall EXAM: PORTABLE CHEST 1 VIEW COMPARISON:  None. FINDINGS: There is subtle ill-defined opacity in the right upper lobe medially. Lungs elsewhere are clear. Heart size and pulmonary vascularity are normal. No  adenopathy. No bone lesions. No pneumothorax peer IMPRESSION: Subtle ill-defined opacity right upper lobe medially. Question early pneumonia in this area. Lungs elsewhere clear. Cardiac silhouette within normal limits. Followup PA and lateral chest radiographs recommended in 3-4 weeks following trial of antibiotic therapy to ensure resolution and exclude underlying malignancy. Electronically Signed   By: Lowella Grip III M.D.   On: 05/04/2016 11:56   Dg C-arm 1-60 Min-no Report  Result Date: 05/04/2016 Fluoroscopy was utilized by the requesting physician.  No radiographic interpretation.   Dg Hip Operative Unilat W Or W/o Pelvis Left  Result Date: 05/04/2016 CLINICAL DATA:  IM nail for left hip fx; 1 min 12 sec fluoro time EXAM: OPERATIVE LEFT HIP (WITH PELVIS IF PERFORMED) 5 VIEWS TECHNIQUE: Fluoroscopic spot image(s) were submitted for interpretation post-operatively. COMPARISON:  05/04/2016 FINDINGS: Fluoroscopic images are submitted, demonstrating intramedullary nail/ lag screw fixation of the intertrochanteric fracture of left hip. No interval fractures identified. IMPRESSION: Status post IM nail fixation of left hip fracture. No adverse features identified Electronically Signed   By: Nolon Nations M.D.   On: 05/04/2016 17:19   Dg Hip Unilat With Pelvis Min 4 Views Left  Result Date: 05/04/2016 CLINICAL DATA:  Pain following fall EXAM: DG HIP (WITH OR WITHOUT PELVIS) 2+V LEFT COMPARISON:  None. FINDINGS: Frontal pelvis as well as frontal and lateral left hip images were obtained. There is a comminuted intertrochanteric femur fracture on the left with varus angulation of the fracture site. No other fractures are evident. No dislocation. There is mild symmetric narrowing of both hip joints. There is extensive calcification in both common and superficial femoral arteries. IMPRESSION: Comminuted intertrochanteric femur fracture on the left with varus angulation at the fracture site. No other  fractures. No dislocations. Mild symmetric narrowing both hip joints. There is femoral artery atherosclerosis bilaterally. Electronically Signed   By: Lowella Grip III M.D.   On: 05/04/2016 11:58    Microbiology: No results found for this or any previous visit (from the past 240 hour(s)).   Labs: Basic Metabolic Panel:  Recent Labs Lab 05/04/16 1111 05/05/16 0355 05/06/16 0435  NA 139 136 137  K 3.3* 3.5 3.6  CL 104 102 105  CO2 29 26 28   GLUCOSE 149* 170* 98  BUN 17 16 22*  CREATININE 0.Brooke 0.78 0.76  CALCIUM 8.8* 8.5* 8.1*   Liver Function Tests:  Recent Labs Lab 05/05/16 0355  AST 29  ALT 23  ALKPHOS 45  BILITOT 0.6  PROT 5.5*  ALBUMIN 3.4*   CBC:  Recent Labs Lab 05/04/16 1111 05/05/16 0355 05/06/16 0435  WBC 7.2 8.6 7.3  HGB 12.8 9.9* 8.7*  HCT 38.7 28.1* 25.2*  MCV 90.4 88.6 89.4  PLT 174 160 139*    Signed:  Barton Dubois MD.  [facility] 05/06/2016, 2:10 PM

## 2016-05-06 NOTE — Discharge Instructions (Signed)
Increase your activities as comfort allows. You may put full weight on your left hip as tolerated. You can get your incisions wet in the shower daily; new dry dressing as needed.

## 2016-05-06 NOTE — Progress Notes (Signed)
CSW assisted in d/c planning. PT consult recommends SNF. Pt ready for d/c today.  CSW met with pt and son at bedside. Both agreeable to SNF. Search was initiated and bed offers presented; Pt chooses [facility].  CSW obtained insurance authorization. Scripts included in d/c packet. Report # provided for pt's RN. [facility] transport necessary- Pt and son aware out of pocket cost may be associated with [facility] transport and are agreeable. Completed medical necessity form and called [facility] for transport.  Sharren Bridge, MSW, LCSW Clinical Social Work, Disposition  05/06/2016 [phone]

## 2016-05-17 ENCOUNTER — Ambulatory Visit (INDEPENDENT_AMBULATORY_CARE_PROVIDER_SITE_OTHER): Payer: Medicare HMO

## 2016-05-17 ENCOUNTER — Ambulatory Visit (INDEPENDENT_AMBULATORY_CARE_PROVIDER_SITE_OTHER): Payer: Medicare HMO | Admitting: Physician Assistant

## 2016-05-17 DIAGNOSIS — S72141D Displaced intertrochanteric fracture of right femur, subsequent encounter for closed fracture with routine healing: Secondary | ICD-10-CM

## 2016-05-17 DIAGNOSIS — M25552 Pain in left hip: Secondary | ICD-10-CM | POA: Diagnosis not present

## 2016-05-17 NOTE — Progress Notes (Signed)
   Post-Op Visit Note   Patient: Brooke Collier           Date of Birth: 1935-03-16           MRN: [id number] Visit Date: 05/17/2016 PCP: Brooke Penna, MD   Assessment & Plan: Left leg weight bearing as tolerated left leg. Continue to work with therapy for gait balance and strengthening left leg. She is able to get the incision wet Staples were removed today Steri-Strips applied. She'll follow with [place] in 1 month sooner if there is any questions or concerns.   Chief Complaint: No chief complaint on file.  Visit Diagnoses:  1. Pain in left hip   2. Closed displaced intertrochanteric fracture of right femur with routine healing, subsequent encounter    HPI: Brooke Collier is 13 days status post IM nailing of a left intertrochanteric fracture. She states overall she is doing well she said [facility] is ambulating to her feet walker. She's doing well as far as her transfers. No concerns. Denies any fevers chills shortness breath calf pain or chest pain.  PE: Right hip incisions are healing well no signs of infection. Well possibly with staples. She has significant ecchymosis down the the entire leg. Calf is supple tenderness in the area of ecchymosis only. She has good dorsiflexion plantar flexion of the ankle.Gentle range of motion the hip reveals fluid motion without significant pain  Follow-Up Instructions: Return in about 4 weeks (around 06/14/2016).   Orders:  Orders Placed This Encounter  Procedures  . XR HIP UNILAT W OR W/O PELVIS 1V LEFT   No orders of the defined types were placed in this encounter.   Imaging: Xr Hip Unilat W Or W/o Pelvis 1v Left  Result Date: 05/17/2016 AP lateral views of the left hip: Status post intertrochanteric hip nailing. No hardware failure. No other fractures identified. Overall good position alignment postop.   PMFS History: Patient Active Problem List   Diagnosis Date Noted  . Protein-calorie malnutrition, severe 05/06/2016  .  Postoperative anemia due to acute blood loss   . Hip fracture ([place]) 05/04/2016  . Closed comminuted intertrochanteric fracture of proximal end of left femur ([place])   . HYPOTHYROIDISM 11/17/2008  . UNSPECIFIED VITAMIN D DEFICIENCY 11/17/2008  . HLD (hyperlipidemia) 11/17/2008  . ANEMIA 11/17/2008  . 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 Essential hypertension 07/21/2006  . ALLERGIC RHINITIS 07/21/2006  . ARTHRITIS 07/21/2006   Past Medical History:  Diagnosis Date  . Allergy   . Chronic interstitial cystitis   . Hypertension     No family history on file.  Past Surgical History:  Procedure Laterality Date  . ABDOMINAL HYSTERECTOMY    . INTRAMEDULLARY (IM) NAIL INTERTROCHANTERIC  05/04/2016  . INTRAMEDULLARY (IM) NAIL INTERTROCHANTERIC Left 05/04/2016   Procedure: INTRAMEDULLARY (IM) NAIL INTERTROCHANTRIC;  Surgeon: Mcarthur Rossetti, MD;  Location: [facility];  Service: Orthopedics;  Laterality: Left;  . tonsillectomyy     Social History   Occupational History  . Not on file.   Social History Main Topics  . Smoking status: Never Smoker  . Smokeless tobacco: Never Used  . Alcohol use No  . Drug use: No  . Sexual activity: No

## 2016-06-04 ENCOUNTER — Telehealth (INDEPENDENT_AMBULATORY_CARE_PROVIDER_SITE_OTHER): Payer: Self-pay | Admitting: Orthopaedic Surgery

## 2016-06-04 NOTE — Telephone Encounter (Signed)
Brooke Collier from [facility] called asking for verbal orders of 1 week 5 and 1 visit as needed. CB # [phone]

## 2016-06-04 NOTE — Telephone Encounter (Signed)
Verbal order given on VM 

## 2016-06-06 ENCOUNTER — Telehealth (INDEPENDENT_AMBULATORY_CARE_PROVIDER_SITE_OTHER): Payer: Self-pay | Admitting: Physician Assistant

## 2016-06-06 NOTE — Telephone Encounter (Signed)
Dorian, PT from [facility] requesting verbal for pt for 3x week for 2 wks.  [phone]

## 2016-06-06 NOTE — Telephone Encounter (Signed)
Verbal order given  

## 2016-06-07 ENCOUNTER — Telehealth (INDEPENDENT_AMBULATORY_CARE_PROVIDER_SITE_OTHER): Payer: Self-pay | Admitting: Radiology

## 2016-06-07 NOTE — Telephone Encounter (Signed)
Geny from [facility] from [place],  Needs orders 1visit x 1week, 2 visits x 3weeks, also does the patient have any precautions that she needs to be aware of.

## 2016-06-07 NOTE — Telephone Encounter (Signed)
Sounds good. Full weight as tolerated on her broken hip and no precautions. Thanks

## 2016-06-10 NOTE — Telephone Encounter (Signed)
I called and lmom for Geny and gave verbal auth

## 2016-06-12 DIAGNOSIS — R600 Localized edema: Secondary | ICD-10-CM | POA: Insufficient documentation

## 2016-06-17 ENCOUNTER — Ambulatory Visit (INDEPENDENT_AMBULATORY_CARE_PROVIDER_SITE_OTHER): Payer: Medicare HMO

## 2016-06-17 ENCOUNTER — Encounter (INDEPENDENT_AMBULATORY_CARE_PROVIDER_SITE_OTHER): Payer: Self-pay | Admitting: Physician Assistant

## 2016-06-17 ENCOUNTER — Ambulatory Visit (INDEPENDENT_AMBULATORY_CARE_PROVIDER_SITE_OTHER): Payer: Medicare HMO | Admitting: Physician Assistant

## 2016-06-17 DIAGNOSIS — S72142D Displaced intertrochanteric fracture of left femur, subsequent encounter for closed fracture with routine healing: Secondary | ICD-10-CM

## 2016-06-17 NOTE — Progress Notes (Signed)
Mrs. Gorelick returns today for follow-up of her left hip fracture. She underwent a IM nailing of her left hip on 05/04/2016. She overall is trending towards improvement. She's having no real pain in the hip. She is ambulating with a cane but states she is beginning to leave it lying around having go back look for.   Physical exam: Pleasant well-developed female in no acute distress. Surgical incisions left hip are healing well no signs of infection. Left calf is supple nontender. Some stiffness with gentle range of motion of the hip and no pain.  Left hip AP lateral views: Status post IM nailing no hardware failure. Excellent position alignment. No bony abnormalities. Good consolidation.  Plan she'll continue to work on range of motion strengthening left hip. She'll follow with [place] on an as-needed basis. She has any questions or concerns.

## 2016-07-15 NOTE — Anesthesia Postprocedure Evaluation (Signed)
Anesthesia Post Note  Patient: Brooke Collier  Procedure(s) Performed: Procedure(s) (LRB): INTRAMEDULLARY (IM) NAIL INTERTROCHANTRIC (Left)     Anesthesia Post Evaluation  Last Vitals:  Vitals:   05/06/16 0509 05/06/16 1300  BP: (!) 132/57 119/69  Pulse: 71 85  Resp: 16 16  Temp: 37.1 C 36.7 C    Last Pain:  Vitals:   05/06/16 1300  TempSrc: Axillary  PainSc: 1                  Robyn Galati S

## 2016-07-15 NOTE — Addendum Note (Signed)
Addendum  created 07/15/16 1239 by Myrtie Soman, MD   Sign clinical note

## 2017-05-07 DIAGNOSIS — F4322 Adjustment disorder with anxiety: Secondary | ICD-10-CM | POA: Insufficient documentation

## 2019-04-14 ENCOUNTER — Other Ambulatory Visit: Payer: Self-pay

## 2019-04-14 ENCOUNTER — Observation Stay (HOSPITAL_COMMUNITY): Payer: Medicare HMO

## 2019-04-14 ENCOUNTER — Emergency Department (HOSPITAL_COMMUNITY): Payer: Medicare HMO

## 2019-04-14 ENCOUNTER — Encounter (HOSPITAL_COMMUNITY): Payer: Self-pay | Admitting: *Deleted

## 2019-04-14 ENCOUNTER — Inpatient Hospital Stay (HOSPITAL_COMMUNITY)
Admission: EM | Admit: 2019-04-14 | Discharge: 2019-04-19 | DRG: 065 | Disposition: A | Discharge status: Rehabilitation Facility | Payer: Medicare HMO | Attending: Family Medicine | Admitting: Family Medicine

## 2019-04-14 DIAGNOSIS — R29898 Other symptoms and signs involving the musculoskeletal system: Secondary | ICD-10-CM

## 2019-04-14 DIAGNOSIS — Z7982 Long term (current) use of aspirin: Secondary | ICD-10-CM

## 2019-04-14 DIAGNOSIS — Z881 Allergy status to other antibiotic agents status: Secondary | ICD-10-CM

## 2019-04-14 DIAGNOSIS — M1990 Unspecified osteoarthritis, unspecified site: Secondary | ICD-10-CM | POA: Diagnosis present

## 2019-04-14 DIAGNOSIS — G8194 Hemiplegia, unspecified affecting left nondominant side: Secondary | ICD-10-CM | POA: Diagnosis present

## 2019-04-14 DIAGNOSIS — M549 Dorsalgia, unspecified: Secondary | ICD-10-CM | POA: Diagnosis present

## 2019-04-14 DIAGNOSIS — Z8249 Family history of ischemic heart disease and other diseases of the circulatory system: Secondary | ICD-10-CM

## 2019-04-14 DIAGNOSIS — G8929 Other chronic pain: Secondary | ICD-10-CM | POA: Diagnosis present

## 2019-04-14 DIAGNOSIS — I6522 Occlusion and stenosis of left carotid artery: Secondary | ICD-10-CM | POA: Diagnosis present

## 2019-04-14 DIAGNOSIS — E785 Hyperlipidemia, unspecified: Secondary | ICD-10-CM

## 2019-04-14 DIAGNOSIS — R29703 NIHSS score 3: Secondary | ICD-10-CM | POA: Diagnosis not present

## 2019-04-14 DIAGNOSIS — E559 Vitamin D deficiency, unspecified: Secondary | ICD-10-CM | POA: Diagnosis present

## 2019-04-14 DIAGNOSIS — N3010 Interstitial cystitis (chronic) without hematuria: Secondary | ICD-10-CM | POA: Diagnosis present

## 2019-04-14 DIAGNOSIS — G2581 Restless legs syndrome: Secondary | ICD-10-CM | POA: Diagnosis present

## 2019-04-14 DIAGNOSIS — I639 Cerebral infarction, unspecified: Secondary | ICD-10-CM | POA: Diagnosis not present

## 2019-04-14 DIAGNOSIS — E039 Hypothyroidism, unspecified: Secondary | ICD-10-CM | POA: Diagnosis present

## 2019-04-14 DIAGNOSIS — R29704 NIHSS score 4: Secondary | ICD-10-CM | POA: Diagnosis not present

## 2019-04-14 DIAGNOSIS — G8191 Hemiplegia, unspecified affecting right dominant side: Secondary | ICD-10-CM | POA: Diagnosis present

## 2019-04-14 DIAGNOSIS — E43 Unspecified severe protein-calorie malnutrition: Secondary | ICD-10-CM

## 2019-04-14 DIAGNOSIS — I739 Peripheral vascular disease, unspecified: Secondary | ICD-10-CM | POA: Diagnosis present

## 2019-04-14 DIAGNOSIS — Z20822 Contact with and (suspected) exposure to covid-19: Secondary | ICD-10-CM | POA: Diagnosis present

## 2019-04-14 DIAGNOSIS — I6381 Other cerebral infarction due to occlusion or stenosis of small artery: Principal | ICD-10-CM | POA: Diagnosis present

## 2019-04-14 DIAGNOSIS — Z90710 Acquired absence of both cervix and uterus: Secondary | ICD-10-CM

## 2019-04-14 DIAGNOSIS — J309 Allergic rhinitis, unspecified: Secondary | ICD-10-CM | POA: Diagnosis present

## 2019-04-14 DIAGNOSIS — N3020 Other chronic cystitis without hematuria: Secondary | ICD-10-CM

## 2019-04-14 DIAGNOSIS — R29702 NIHSS score 2: Secondary | ICD-10-CM | POA: Diagnosis not present

## 2019-04-14 DIAGNOSIS — Z79899 Other long term (current) drug therapy: Secondary | ICD-10-CM

## 2019-04-14 DIAGNOSIS — I160 Hypertensive urgency: Secondary | ICD-10-CM | POA: Diagnosis not present

## 2019-04-14 DIAGNOSIS — Z818 Family history of other mental and behavioral disorders: Secondary | ICD-10-CM

## 2019-04-14 DIAGNOSIS — I6330 Cerebral infarction due to thrombosis of unspecified cerebral artery: Secondary | ICD-10-CM

## 2019-04-14 DIAGNOSIS — N3030 Trigonitis without hematuria: Secondary | ICD-10-CM

## 2019-04-14 DIAGNOSIS — R9431 Abnormal electrocardiogram [ECG] [EKG]: Secondary | ICD-10-CM | POA: Diagnosis present

## 2019-04-14 DIAGNOSIS — F411 Generalized anxiety disorder: Secondary | ICD-10-CM

## 2019-04-14 DIAGNOSIS — I10 Essential (primary) hypertension: Secondary | ICD-10-CM | POA: Diagnosis present

## 2019-04-14 DIAGNOSIS — R29700 NIHSS score 0: Secondary | ICD-10-CM | POA: Diagnosis present

## 2019-04-14 LAB — I-STAT CHEM 8, ED
BUN: 19 mg/dL (ref 8–23)
Calcium, Ion: 1.07 mmol/L — ABNORMAL LOW (ref 1.15–1.40)
Chloride: 102 mmol/L (ref 98–111)
Creatinine, Ser: 0.7 mg/dL (ref 0.44–1.00)
Glucose, Bld: 83 mg/dL (ref 70–99)
HCT: 41 % (ref 36.0–46.0)
Hemoglobin: 13.9 g/dL (ref 12.0–15.0)
Potassium: 3.5 mmol/L (ref 3.5–5.1)
Sodium: 141 mmol/L (ref 135–145)
TCO2: 33 mmol/L — ABNORMAL HIGH (ref 22–32)

## 2019-04-14 LAB — ETHANOL: Alcohol, Ethyl (B): <10 mg/dL (ref <10)

## 2019-04-14 LAB — CBC
HCT: 43.9 % (ref 36.0–46.0)
Hemoglobin: 14.4 g/dL (ref 12.0–15.0)
MCH: 31.1 pg (ref 26.0–34.0)
MCHC: 32.8 g/dL (ref 30.0–36.0)
MCV: 94.8 fL (ref 80.0–100.0)
Platelets: 193 10*3/uL (ref 150–400)
RBC: 4.63 MIL/uL (ref 3.87–5.11)
RDW: 13.8 % (ref 11.5–15.5)
WBC: 4.2 10*3/uL (ref 4.0–10.5)
nRBC: 0 % (ref 0.0–0.2)

## 2019-04-14 LAB — SARS CORONAVIRUS 2 (TAT 6-24 HRS): SARS Coronavirus 2: NEGATIVE

## 2019-04-14 LAB — COMPREHENSIVE METABOLIC PANEL
ALT: 27 U/L (ref 0–44)
AST: 36 U/L (ref 15–41)
Albumin: 3.9 g/dL (ref 3.5–5.0)
Alkaline Phosphatase: 44 U/L (ref 38–126)
Anion gap: 12 (ref 5–15)
BUN: 16 mg/dL (ref 8–23)
CO2: 27 mmol/L (ref 22–32)
Calcium: 9.2 mg/dL (ref 8.9–10.3)
Chloride: 103 mmol/L (ref 98–111)
Creatinine, Ser: 0.9 mg/dL (ref 0.44–1.00)
GFR calc Af Amer: >60 mL/min (ref >60)
GFR calc non Af Amer: 60 mL/min — ABNORMAL LOW (ref >60)
Glucose, Bld: 86 mg/dL (ref 70–99)
Potassium: 3.5 mmol/L (ref 3.5–5.1)
Sodium: 142 mmol/L (ref 135–145)
Total Bilirubin: 1 mg/dL (ref 0.3–1.2)
Total Protein: 6.5 g/dL (ref 6.5–8.1)

## 2019-04-14 LAB — RAPID URINE DRUG SCREEN, HOSP PERFORMED
Amphetamines: NOT DETECTED
Barbiturates: NOT DETECTED
Benzodiazepines: NOT DETECTED
Cocaine: NOT DETECTED
Opiates: NOT DETECTED
Tetrahydrocannabinol: NOT DETECTED

## 2019-04-14 LAB — URINALYSIS, ROUTINE W REFLEX MICROSCOPIC
Bacteria, UA: NONE SEEN
Bilirubin Urine: NEGATIVE
Glucose, UA: NEGATIVE mg/dL
Hgb urine dipstick: NEGATIVE
Ketones, ur: NEGATIVE mg/dL
Nitrite: NEGATIVE
Protein, ur: NEGATIVE mg/dL
Specific Gravity, Urine: 1.003 — ABNORMAL LOW (ref 1.005–1.030)
pH: 8 (ref 5.0–8.0)

## 2019-04-14 LAB — DIFFERENTIAL
Abs Immature Granulocytes: 0.01 10*3/uL (ref 0.00–0.07)
Basophils Absolute: 0 10*3/uL (ref 0.0–0.1)
Basophils Relative: 1 %
Eosinophils Absolute: 0.1 10*3/uL (ref 0.0–0.5)
Eosinophils Relative: 1 %
Immature Granulocytes: 0 %
Lymphocytes Relative: 20 %
Lymphs Abs: 0.8 10*3/uL (ref 0.7–4.0)
Monocytes Absolute: 0.3 10*3/uL (ref 0.1–1.0)
Monocytes Relative: 6 %
Neutro Abs: 3 10*3/uL (ref 1.7–7.7)
Neutrophils Relative %: 72 %

## 2019-04-14 LAB — TSH: TSH: 1.765 u[IU]/mL (ref 0.350–4.500)

## 2019-04-14 LAB — PROTIME-INR
INR: 1 (ref 0.8–1.2)
Prothrombin Time: 13 seconds (ref 11.4–15.2)

## 2019-04-14 LAB — APTT: aPTT: 25 seconds (ref 24–36)

## 2019-04-14 LAB — CBG MONITORING, ED: Glucose-Capillary: 76 mg/dL (ref 70–99)

## 2019-04-14 MED ORDER — ACETAMINOPHEN 650 MG RE SUPP: 650.0000 mg | RECTAL | Status: DC | PRN

## 2019-04-14 MED ORDER — ENOXAPARIN SODIUM 30 MG/0.3ML ~~LOC~~ SOLN
30.0000 mg | SUBCUTANEOUS | Status: DC
  Administered 2019-04-14 – 2019-04-19 (×6): 30 mg via SUBCUTANEOUS
  Filled 2019-04-14 (×6): qty 0.3

## 2019-04-14 MED ORDER — ASPIRIN EC 325 MG PO TBEC
325.0000 mg | DELAYED_RELEASE_TABLET | Freq: Every day | ORAL | Status: DC
  Administered 2019-04-15: 325 mg via ORAL
  Filled 2019-04-14: qty 1

## 2019-04-14 MED ORDER — STROKE: EARLY STAGES OF RECOVERY BOOK
Freq: Once | Status: DC
  Filled 2019-04-14: qty 1

## 2019-04-14 MED ORDER — ALUM & MAG HYDROXIDE-SIMETH 200-200-20 MG/5ML PO SUSP
30.0000 mL | ORAL | Status: DC | PRN
  Administered 2019-04-14 – 2019-04-16 (×2): 30 mL via ORAL
  Filled 2019-04-14 (×2): qty 30

## 2019-04-14 MED ORDER — ACETAMINOPHEN 160 MG/5ML PO SOLN: 650.0000 mg | ORAL | Status: DC | PRN

## 2019-04-14 MED ORDER — ACETAMINOPHEN 325 MG PO TABS
650.0000 mg | ORAL_TABLET | ORAL | Status: DC | PRN
  Administered 2019-04-15 – 2019-04-17 (×5): 650 mg via ORAL
  Filled 2019-04-14 (×5): qty 2

## 2019-04-14 MED ORDER — ATORVASTATIN CALCIUM 40 MG PO TABS
40.0000 mg | ORAL_TABLET | Freq: Every day | ORAL | Status: DC
  Administered 2019-04-14 – 2019-04-18 (×5): 40 mg via ORAL
  Filled 2019-04-14 (×5): qty 1

## 2019-04-14 NOTE — Code Documentation (Signed)
Code Documentation - Code Stroke   Patient arrived via EMS with complaints of having bilateral lower extremity weakness, she endorsed that the LT side was more weaker and she had LT side weakness in her arm coupled with numbness. Upon arrival to [facility], endorses mild headache and nausea - SBP 210 on scene for EMS and currently SBP 180s. CT HEAD - negative for hemorrhage.   Plan: -- Medicine to admit for TIA workup, within TPA window til 1330 - if symptoms worsen or come back, Code Stroke to be called. -- Rest per MD.

## 2019-04-14 NOTE — ED Notes (Signed)
Pt transported to MRI 

## 2019-04-14 NOTE — Progress Notes (Signed)
Nurse Mykenzie reports patient passed stroke swallow screen.  Diet ordered.  [place] Constable, D.O.  PGY-2 Family Medicine  04/14/2019 1:45 PM

## 2019-04-14 NOTE — ED Provider Notes (Signed)
[place] EMERGENCY DEPARTMENT Provider Note   CSN: [id number] Arrival date & time: 04/14/19  1049  An emergency department physician performed an initial assessment on this suspected stroke patient at 1049.  History Chief Complaint  Patient presents with  . Code Stroke    Brooke Collier is a 84 y.o. female.  The history is provided by the patient, the EMS personnel and medical records. No language interpreter was used.   Brooke Collier is a 84 y.o. female who presents to the Emergency Department complaining of numbness. She presents the emergency department as a code stroke by EMS for evaluation of left sided numbness and weakness that began this morning. She was last known normal about 9 AM this morning. She lives at home alone. She noticed that her left arm and leg suddenly began to feel numb and heavy. She had difficulty walking. She also felt like she was having some difficulty with the right leg as well. She denies any additional symptoms. Denies any recent illnesses. No speech difficulties. Overall she feels like the heaviness is improving but it does persist.    Past Medical History:  Diagnosis Date  . Allergy   . Chronic interstitial cystitis   . Hypertension     Patient Active Problem List   Diagnosis Date Noted  . Weakness of extremity 04/14/2019  . Protein-calorie malnutrition, severe 05/06/2016  . Postoperative anemia due to acute blood loss   . Hip fracture ([place]) 05/04/2016  . Closed comminuted intertrochanteric fracture of proximal end of left femur ([place])   . HYPOTHYROIDISM 11/17/2008  . UNSPECIFIED VITAMIN D DEFICIENCY 11/17/2008  . HLD (hyperlipidemia) 11/17/2008  . ANEMIA 11/17/2008  . 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 Essential hypertension 07/21/2006  . ALLERGIC RHINITIS 07/21/2006  . ARTHRITIS 07/21/2006    Past Surgical History:  Procedure Laterality Date  . ABDOMINAL HYSTERECTOMY    . INTRAMEDULLARY (IM)  NAIL INTERTROCHANTERIC  05/04/2016  . INTRAMEDULLARY (IM) NAIL INTERTROCHANTERIC Left 05/04/2016   Procedure: INTRAMEDULLARY (IM) NAIL INTERTROCHANTRIC;  Surgeon: Mcarthur Rossetti, MD;  Location: [facility];  Service: Orthopedics;  Laterality: Left;  . tonsillectomyy       OB History   No obstetric history on file.     Family History  Problem Relation Age of Onset  . Heart disease Mother   . Heart disease Father   . Anxiety disorder Sister   . Heart disease Brother   . Anxiety disorder Brother     Social History   Tobacco Use  . Smoking status: Never Smoker  . Smokeless tobacco: Never Used  Substance Use Topics  . Alcohol use: No  . Drug use: No    Home Medications Prior to Admission medications   Medication Sig Start Date End Date Taking? Authorizing Provider  acetaminophen (TYLENOL) 325 MG tablet Take 2 tablets (650 mg total) by mouth every 6 (six) hours as needed (Pain, Hadache or Fever >/= 101). 05/06/16  Yes Barton Dubois, MD  ALPRAZolam Duanne Moron) 0.25 MG tablet Take 0.125 mg by mouth 2 (two) times daily. 04/06/19  Yes [provider]  amLODipine (NORVASC) 2.5 MG tablet Take 2.5 mg by mouth daily. 04/18/16  Yes [provider]  aspirin EC 325 MG EC tablet Take 1 tablet (325 mg total) by mouth daily with breakfast. Patient taking differently: Take 81 mg by mouth daily with breakfast.  05/06/16  Yes Mcarthur Rossetti, MD  Calcium Carb-Cholecalciferol (CALCIUM-VITAMIN D) 500-200 MG-UNIT tablet Take 1 tablet by  mouth daily.   Yes [provider]  carboxymethylcellulose (REFRESH PLUS) 0.5 % SOLN Place 1 drop into both eyes daily as needed (dry eyes).   Yes [provider]  Co-Enzyme Q-10 30 MG CAPS Take 30 mg by mouth daily.   Yes [provider]  Magnesium 100 MG TABS Take 100 mg by mouth daily.   Yes [provider]  Misc Natural Products ([place] PO) Take 1 tablet by mouth in the morning and at bedtime.    Yes [provider]  olmesartan (BENICAR) 40 MG tablet Take 20 mg by mouth 2 (two) times daily.  02/09/19  Yes [provider]  Calcium Carbonate (CALCIUM 500 PO) Take by mouth daily.      [provider]  iron polysaccharides (NIFEREX) 150 MG capsule Take 1 capsule (150 mg total) by mouth daily. Patient not taking: Reported on 04/14/2019 05/07/16   Barton Dubois, MD    Allergies    Ciprofloxacin and Zoloft [sertraline]  Review of Systems   Review of Systems  All other systems reviewed and are negative.   Physical Exam Updated Vital Signs BP (!) 155/68   Pulse 93   Temp 98.6 F (37 C) (Oral)   Resp 13   Ht 5\' 5"  (1.651 m)   Wt 44.4 kg   SpO2 100%   BMI 16.29 kg/m   Physical Exam Vitals and nursing note reviewed.  Constitutional:      Appearance: She is well-developed.  HENT:     Head: Normocephalic and atraumatic.  Cardiovascular:     Rate and Rhythm: Normal rate and regular rhythm.  Pulmonary:     Effort: Pulmonary effort is normal. No respiratory distress.  Abdominal:     Palpations: Abdomen is soft.     Tenderness: There is no abdominal tenderness. There is no guarding or rebound.  Musculoskeletal:        General: No swelling or tenderness.  Skin:    General: Skin is warm and dry.  Neurological:     Mental Status: She is alert and oriented to person, place, and time.     Comments: Visual fields are grossly intact. No asymmetry of facial movements. Five out of five strength in bilateral upper and lower extremities with sensation to light touch intact in all four extremities.  Psychiatric:        Behavior: Behavior normal.     ED Results / Procedures / Treatments   Labs (all labs ordered are listed, but only abnormal results are displayed) Labs Reviewed  COMPREHENSIVE METABOLIC PANEL - Abnormal; Notable for the following components:      Result Value   GFR calc non Af Amer 60 (*)    All other components within normal limits    URINALYSIS, ROUTINE W REFLEX MICROSCOPIC - Abnormal; Notable for the following components:   Color, Urine STRAW (*)    Specific Gravity, Urine 1.003 (*)    Leukocytes,Ua TRACE (*)    All other components within normal limits  I-STAT CHEM 8, ED - Abnormal; Notable for the following components:   Calcium, Ion 1.07 (*)    TCO2 33 (*)    All other components within normal limits  SARS CORONAVIRUS 2 (TAT 6-24 HRS)  PROTIME-INR  APTT  CBC  DIFFERENTIAL  RAPID URINE DRUG SCREEN, HOSP PERFORMED  TSH  ETHANOL  CBG MONITORING, ED    EKG EKG Interpretation  Date/Time:  Wednesday April 14 2019 11:07:56 EST Ventricular Rate:  72 PR  Interval:    QRS Duration: 91 QT Interval:  478 QTC Calculation: 524 R Axis:   62 Text Interpretation: Sinus rhythm Atrial premature complex Left ventricular hypertrophy Prolonged QT interval Confirmed by Quintella Reichert [phone]) on 04/14/2019 11:20:09 AM   Radiology MR BRAIN WO CONTRAST  Result Date: 04/14/2019 CLINICAL DATA:  84 year old female code stroke, left-sided weakness. EXAM: MRI HEAD WITHOUT CONTRAST TECHNIQUE: Multiplanar, multiecho pulse sequences of the brain and surrounding structures were obtained without intravenous contrast. COMPARISON:  Plain head CT earlier today. FINDINGS: Brain: There is a small 8 mm focus of restricted diffusion in the dorsal right deep gray nuclei, lateral thalamus near the posterior limb of the right internal capsule (series 5, image 69). No significant T2 or FLAIR hyperintensity at this time. Questionable punctate contralateral anterior left thalamic focus of restricted diffusion (series 7, image 54). No evidence of associated acute hemorrhage.  No mass effect. Underlying bilateral thalamic and basal ganglia T2 and FLAIR heterogeneity. Scattered chronic microhemorrhages, including in both occipital lobes, left perirolandic cortex on series 14, image 37. Confluent bilateral cerebral white matter T2 and FLAIR hyperintensity. No  cortical encephalomalacia identified. No other restricted diffusion. No midline shift, mass effect, evidence of mass lesion, ventriculomegaly, extra-axial collection or acute intracranial hemorrhage. Cervicomedullary junction and pituitary are within normal limits. Mild T2 heterogeneity in the pons. Vascular: Major intracranial vascular flow voids are preserved. The right vertebral artery is dominant. Skull and upper cervical spine: Negative for age visible cervical spine. Normal bone marrow signal. Sinuses/Orbits: Postoperative changes to the left globe. Paranasal sinuses and mastoids are stable and well pneumatized. Other: Grossly normal internal auditory structures. Scalp and face soft tissues appear negative. IMPRESSION: 1. Acute on chronic small vessel ischemia in the thalami: An 8 mm right lateral thalamic/internal capsule lacune is noted along with evidence of a punctate contralateral left ventral thalamic infarct. 2. No associated acute hemorrhage or mass effect. Occasional chronic micro hemorrhages in conjunction with underlying chronic small vessel disease. Electronically Signed   By: Genevie Ann M.D.   On: 04/14/2019 12:58   DG CHEST PORT 1 VIEW  Result Date: 04/14/2019 CLINICAL DATA:  Headache and left-sided numbness. EXAM: PORTABLE CHEST 1 VIEW COMPARISON:  Chest x-ray 05/04/2016 FINDINGS: The heart is within normal limits in size given the AP projection and portable technique. There is mild tortuosity and calcification of the thoracic aorta. Stable hyperinflation and emphysematous changes but no acute overlying pulmonary process or worrisome pulmonary lesions. No pleural effusion. The bony thorax is intact. IMPRESSION: Stable emphysematous changes and hyperinflation but no acute overlying pulmonary process. Electronically Signed   By: Marijo Sanes M.D.   On: 04/14/2019 14:11   CT HEAD CODE STROKE WO CONTRAST  Result Date: 04/14/2019 CLINICAL DATA:  Code stroke. 84 year old female with left side  weakness, last seen normal 0900 hours. EXAM: CT HEAD WITHOUT CONTRAST TECHNIQUE: Contiguous axial images were obtained from the base of the skull through the vertex without intravenous contrast. COMPARISON:  Head CT 05/04/2016. FINDINGS: Brain: No midline shift, mass effect, or evidence of intracranial mass lesion. No ventriculomegaly. No acute intracranial hemorrhage identified. Confluent bilateral cerebral white matter hypodensity has not significantly changed since 2018. Posterior deep white matter capsule involvement as before. Lesser deep gray nuclei heterogeneity also appears stable. No superimposed acute cortically based infarct identified. Vascular: Calcified atherosclerosis at the skull base. No suspicious intracranial vascular hyperdensity. Skull: No acute osseous abnormality identified. Sinuses/Orbits: Visualized paranasal sinuses and mastoids are stable and well pneumatized. Other: Postoperative  changes to the left globe since 2018. No acute orbit or scalp soft tissue findings. ASPECTS [facility] Stroke Program Early CT Score) Total score (0-10 with 10 being normal): 10 IMPRESSION: 1. No acute cortically based infarct or acute intracranial hemorrhage identified. ASPECTS 10. 2. Chronic small vessel disease appears stable by CT since 2018. 3. These results were communicated to Dr. Royal Hawthorn at 11:11 am on 04/14/2019 by text page via the [facility] messaging system. Electronically Signed   By: Genevie Ann M.D.   On: 04/14/2019 11:11    Procedures Procedures (including critical care time)  Medications Ordered in ED Medications   stroke: mapping our early stages of recovery book (has no administration in time range)  acetaminophen (TYLENOL) tablet 650 mg (has no administration in time range)    Or  acetaminophen (TYLENOL) 160 MG/5ML solution 650 mg (has no administration in time range)    Or  acetaminophen (TYLENOL) suppository 650 mg (has no administration in time range)  enoxaparin (LOVENOX) injection 30 mg  (30 mg Subcutaneous Given 04/14/19 1538)  atorvastatin (LIPITOR) tablet 40 mg (has no administration in time range)  aspirin EC tablet 325 mg (has no administration in time range)    ED Course  I have reviewed the triage vital signs and the nursing notes.  Pertinent labs & imaging results that were available during my care of the patient were reviewed by me and considered in my medical decision making (see chart for details).    MDM Rules/Calculators/A&P                     Patient presents the emergency department as a code stroke for left sided numbness and weakness. On ED arrival her symptoms are improving, NIH of zero. She has been evaluated by neurology and is found to be not a TPA candidate. Plan to admit for further evaluation. Medicine consulted for admission. Pt updated of findings of studies and recommendation for admission and she is in agreement with treatment plan.    Final Clinical Impression(s) / ED Diagnoses Final diagnoses:  Stroke [facility])    Rx / [place] Orders ED Discharge Orders    None       Quintella Reichert, MD 04/14/19 [phone]

## 2019-04-14 NOTE — Consult Note (Addendum)
Reason for Consult:CODE STROKE Referring Physician: Quintella Reichert Brooke Collier is an 84 y.o. female.  With past medical history of hypertension, anxiety and interstitial cystitis who presented as a code stroke via EMS.  She states she was fine until 9 AM today.  She noticed that both her legs are weak and heavy and she was unable to get up and had to crawl and she called her daughter over the phone but was able to speak clearly.  Daughter called 62 and EMS arrived and the nurse noticed patient to be awake alert and having subjective left arm and leg weakness.  Blood pressure was significantly elevated above 200 initially and subsequently was found to be 170/79 on arrival.  Patient stated clearly she had bilateral lower extremity weakness but left side felt more heavier or weaker than the right along with some numbness in the left arm.  Her NIH stroke scale on admission was 0 and she complained of mild headache as well as nausea.  CT scan of the head was obtained emergently which showed no acute abnormality or hemorrhage.  Aspect score was 10.  Patient will be admitted for evaluation for possible TIA to the family practice teaching service.  She denies any prior history of strokes TIAs seizures or significant neurological problems.  She admits to underlying anxiety related to Covid pandemic LSN 9 am 04/14/2019 TPA no as no deficits  Past Medical History:  Diagnosis Date  . Allergy   . Chronic interstitial cystitis   . Hypertension     Past Surgical History:  Procedure Laterality Date  . ABDOMINAL HYSTERECTOMY    . INTRAMEDULLARY (IM) NAIL INTERTROCHANTERIC  05/04/2016  . INTRAMEDULLARY (IM) NAIL INTERTROCHANTERIC Left 05/04/2016   Procedure: INTRAMEDULLARY (IM) NAIL INTERTROCHANTRIC;  Surgeon: Mcarthur Rossetti, MD;  Location: [facility];  Service: Orthopedics;  Laterality: Left;  . tonsillectomyy      Family History  Problem Relation Age of Onset  . Heart disease Mother   . Heart  disease Father   . Anxiety disorder Sister   . Heart disease Brother   . Anxiety disorder Brother     Social History:  reports that she has never smoked. She has never used smokeless tobacco. She reports that she does not drink alcohol or use drugs.  Allergies:  Allergies  Allergen Reactions  . Ciprofloxacin     Muscle weakness  . Zoloft [Sertraline] Nausea And Vomiting    Medications: I have reviewed the patient's current medications. acetaminophen (TYLENOL) 325 MG tablet Take 2 tablets (650 mg total) by mouth every 6 (six) hours as needed (Pain, Hadache or Fever >/= 101).     [name] amLODipine (NORVASC) 2.5 MG tablet Take 2.5 mg by mouth daily.    [name] aspirin EC 325 MG EC tablet Take 1 tablet (325 mg total) by mouth daily with breakfast. 30 tablet 0  . Calcium Carb-Cholecalciferol (CALCIUM-VITAMIN D) 500-200 MG-UNIT tablet Take 1 tablet by mouth daily.    . Calcium Carbonate (CALCIUM 500 PO) Take by mouth daily.      [name] Co-Enzyme Q-10 30 MG CAPS Take 30 mg by mouth daily.    . iron polysaccharides (NIFEREX) 150 MG capsule Take 1 capsule (150 mg total) by mouth daily.       Results for orders placed or performed during the hospital encounter of 04/14/19 (from the past 48 hour(s))  Protime-INR     Status: None   Collection Time: 04/14/19 10:52 AM  Result Value Ref Range  Prothrombin Time 13.0 11.4 - 15.2 seconds   INR 1.0 0.8 - 1.2    Comment: (NOTE) INR goal varies based on device and disease states. Performed at [facility], [place] [address], [place], [place] [address]   APTT     Status: None   Collection Time: 04/14/19 10:52 AM  Result Value Ref Range   aPTT 25 24 - 36 seconds    Comment: Performed at [facility] [address], [place] [address]  CBC     Status: None   Collection Time: 04/14/19 10:52 AM  Result Value Ref Range   WBC 4.2 4.0 - 10.5 K/uL   RBC 4.63 3.87 - 5.11 MIL/uL   Hemoglobin 14.4 12.0 - 15.0 g/dL   HCT 43.9 36.0 - 46.0  %   MCV 94.8 80.0 - 100.0 fL   MCH 31.1 26.0 - 34.0 pg   MCHC 32.8 30.0 - 36.0 g/dL   RDW 13.8 11.5 - 15.5 %   Platelets 193 150 - 400 K/uL   nRBC 0.0 0.0 - 0.2 %    Comment: Performed at [facility], [place] [address], [place], [place] [address]  Differential     Status: None   Collection Time: 04/14/19 10:52 AM  Result Value Ref Range   Neutrophils Relative % 72 %   Neutro Abs 3.0 1.7 - 7.7 K/uL   Lymphocytes Relative 20 %   Lymphs Abs 0.8 0.7 - 4.0 K/uL   Monocytes Relative 6 %   Monocytes Absolute 0.3 0.1 - 1.0 K/uL   Eosinophils Relative 1 %   Eosinophils Absolute 0.1 0.0 - 0.5 K/uL   Basophils Relative 1 %   Basophils Absolute 0.0 0.0 - 0.1 K/uL   Immature Granulocytes 0 %   Abs Immature Granulocytes 0.01 0.00 - 0.07 K/uL    Comment: Performed at [facility] [address], [place], [place] [address]  Comprehensive metabolic panel     Status: Abnormal   Collection Time: 04/14/19 10:52 AM  Result Value Ref Range   Sodium 142 135 - 145 mmol/L   Potassium 3.5 3.5 - 5.1 mmol/L   Chloride 103 98 - 111 mmol/L   CO2 27 22 - 32 mmol/L   Glucose, Bld 86 70 - 99 mg/dL    Comment: Glucose reference range applies only to samples taken after fasting for at least 8 hours.   BUN 16 8 - 23 mg/dL   Creatinine, Ser 0.90 0.44 - 1.00 mg/dL   Calcium 9.2 8.9 - 10.3 mg/dL   Total Protein 6.5 6.5 - 8.1 g/dL   Albumin 3.9 3.5 - 5.0 g/dL   AST 36 15 - 41 U/L   ALT 27 0 - 44 U/L   Alkaline Phosphatase 44 38 - 126 U/L   Total Bilirubin 1.0 0.3 - 1.2 mg/dL   GFR calc non Af Amer 60 (L) >60 mL/min   GFR calc Af Amer >60 >60 mL/min   Anion gap 12 5 - 15    Comment: Performed at [facility], [place] [address], [place], [place] [address]  Urine rapid drug screen (hosp performed)     Status: None   Collection Time: 04/14/19 10:52 AM  Result Value Ref Range   Opiates NONE DETECTED NONE DETECTED   Cocaine NONE DETECTED NONE DETECTED   Benzodiazepines NONE DETECTED NONE DETECTED    Amphetamines NONE DETECTED NONE DETECTED   Tetrahydrocannabinol NONE DETECTED NONE DETECTED  Barbiturates NONE DETECTED NONE DETECTED    Comment: (NOTE) DRUG SCREEN FOR MEDICAL PURPOSES ONLY.  IF CONFIRMATION IS NEEDED FOR ANY PURPOSE, NOTIFY LAB WITHIN 5 DAYS. LOWEST DETECTABLE LIMITS FOR URINE DRUG SCREEN Drug Class                     Cutoff (ng/mL) Amphetamine and metabolites    1000 Barbiturate and metabolites    200 Benzodiazepine                 [id number] Tricyclics and metabolites     300 Opiates and metabolites        300 Cocaine and metabolites        300 THC                            50 Performed at [facility], [place] [address] [place], [place] [address]   Urinalysis, Routine w reflex microscopic     Status: Abnormal   Collection Time: 04/14/19 10:52 AM  Result Value Ref Range   Color, Urine STRAW (A) YELLOW   APPearance CLEAR CLEAR   Specific Gravity, Urine 1.003 (L) 1.005 - 1.030   pH 8.0 5.0 - 8.0   Glucose, UA NEGATIVE NEGATIVE mg/dL   Hgb urine dipstick NEGATIVE NEGATIVE   Bilirubin Urine NEGATIVE NEGATIVE   Ketones, ur NEGATIVE NEGATIVE mg/dL   Protein, ur NEGATIVE NEGATIVE mg/dL   Nitrite NEGATIVE NEGATIVE   Leukocytes,Ua TRACE (A) NEGATIVE   RBC / HPF 0-5 0 - 5 RBC/hpf   WBC, UA 0-5 0 - 5 WBC/hpf   Bacteria, UA NONE SEEN NONE SEEN    Comment: Performed at [facility] [address], [place],  [address]  CBG monitoring, ED     Status: None   Collection Time: 04/14/19 10:52 AM  Result Value Ref Range   Glucose-Capillary 76 70 - 99 mg/dL    Comment: Glucose reference range applies only to samples taken after fasting for at least 8 hours.  I-stat chem 8, ED     Status: Abnormal   Collection Time: 04/14/19 10:58 AM  Result Value Ref Range   Sodium 141 135 - 145 mmol/L   Potassium 3.5 3.5 - 5.1 mmol/L   Chloride 102 98 - 111 mmol/L   BUN 19 8 - 23 mg/dL   Creatinine, Ser 0.70 0.44 - 1.00 mg/dL   Glucose, Bld 83 70 - 99 mg/dL     Comment: Glucose reference range applies only to samples taken after fasting for at least 8 hours.   Calcium, Ion 1.07 (L) 1.15 - 1.40 mmol/L   TCO2 33 (H) 22 - 32 mmol/L   Hemoglobin 13.9 12.0 - 15.0 g/dL   HCT 41.0 36.0 - 46.0 %    MR BRAIN WO CONTRAST  Result Date: 04/14/2019 CLINICAL DATA:  84 year old female code stroke, left-sided weakness. EXAM: MRI HEAD WITHOUT CONTRAST TECHNIQUE: Multiplanar, multiecho pulse sequences of the brain and surrounding structures were obtained without intravenous contrast. COMPARISON:  Plain head CT earlier today. FINDINGS: Brain: There is a small 8 mm focus of restricted diffusion in the dorsal right deep gray nuclei, lateral thalamus near the posterior limb of the right internal capsule (series 5, image 69). No significant T2 or FLAIR hyperintensity at this time. Questionable punctate contralateral anterior left thalamic focus of restricted diffusion (series 7, image 54). No evidence of associated acute hemorrhage.  No mass effect. Underlying  bilateral thalamic and basal ganglia T2 and FLAIR heterogeneity. Scattered chronic microhemorrhages, including in both occipital lobes, left perirolandic cortex on series 14, image 37. Confluent bilateral cerebral white matter T2 and FLAIR hyperintensity. No cortical encephalomalacia identified. No other restricted diffusion. No midline shift, mass effect, evidence of mass lesion, ventriculomegaly, extra-axial collection or acute intracranial hemorrhage. Cervicomedullary junction and pituitary are within normal limits. Mild T2 heterogeneity in the pons. Vascular: Major intracranial vascular flow voids are preserved. The right vertebral artery is dominant. Skull and upper cervical spine: Negative for age visible cervical spine. Normal bone marrow signal. Sinuses/Orbits: Postoperative changes to the left globe. Paranasal sinuses and mastoids are stable and well pneumatized. Other: Grossly normal internal auditory structures. Scalp  and face soft tissues appear negative. IMPRESSION: 1. Acute on chronic small vessel ischemia in the thalami: An 8 mm right lateral thalamic/internal capsule lacune is noted along with evidence of a punctate contralateral left ventral thalamic infarct. 2. No associated acute hemorrhage or mass effect. Occasional chronic micro hemorrhages in conjunction with underlying chronic small vessel disease. Electronically Signed   By: Genevie Ann M.D.   On: 04/14/2019 12:58   CT HEAD CODE STROKE WO CONTRAST  Result Date: 04/14/2019 CLINICAL DATA:  Code stroke. 84 year old female with left side weakness, last seen normal 0900 hours. EXAM: CT HEAD WITHOUT CONTRAST TECHNIQUE: Contiguous axial images were obtained from the base of the skull through the vertex without intravenous contrast. COMPARISON:  Head CT 05/04/2016. FINDINGS: Brain: No midline shift, mass effect, or evidence of intracranial mass lesion. No ventriculomegaly. No acute intracranial hemorrhage identified. Confluent bilateral cerebral white matter hypodensity has not significantly changed since 2018. Posterior deep white matter capsule involvement as before. Lesser deep gray nuclei heterogeneity also appears stable. No superimposed acute cortically based infarct identified. Vascular: Calcified atherosclerosis at the skull base. No suspicious intracranial vascular hyperdensity. Skull: No acute osseous abnormality identified. Sinuses/Orbits: Visualized paranasal sinuses and mastoids are stable and well pneumatized. Other: Postoperative changes to the left globe since 2018. No acute orbit or scalp soft tissue findings. ASPECTS [facility] Stroke Program Early CT Score) Total score (0-10 with 10 being normal): 10 IMPRESSION: 1. No acute cortically based infarct or acute intracranial hemorrhage identified. ASPECTS 10. 2. Chronic small vessel disease appears stable by CT since 2018. 3. These results were communicated to Dr. Royal Hawthorn at 11:11 am on 04/14/2019 by text page via  the [facility] messaging system. Electronically Signed   By: Genevie Ann M.D.   On: 04/14/2019 11:11       ROS Blood pressure (!) 186/92, pulse 64, temperature 98.6 F (37 C), temperature source Oral, resp. rate 15, height 5\' 5"  (1.651 m), weight 44.4 kg, SpO2 98 %. Physical Exam Pleasant frail elderly Caucasian lady not in distress. . Afebrile. Head is nontraumatic. Neck is supple without bruit.    Cardiac exam no murmur or gallop. Lungs are clear to auscultation. Distal pulses are well felt. Neurological Exam ;  Awake  Alert oriented x 3. Normal speech and language.eye movements full without nystagmus.fundi were not visualized. Vision acuity and fields appear normal. Hearing is normal. Palatal movements are normal. Face symmetric. Tongue midline. Normal strength, tone, reflexes and coordination. Normal sensation. Gait deferred.  Assessment/Plan: 84 year old lady presented with sudden onset of bilateral lower extremity weakness and heaviness with questionable left sided numbness in the setting of significantly elevated blood pressure.  Patient has a nonfocal neurological exam with NIH of 0 upon arrival.  Doubt this represents a TIA  or just changes related to her significantly elevated blood pressure. Basic lab chemistries, CBC, UA and CT head are unremarkable  Recommended patient for observation with family practice teaching team.  Check swallow eval and resume home blood pressure medications for systolic blood pressure goal below 200.  Check MRI scan of the brain though I doubt this is a stroke.  Start aspirin daily for stroke prevention.  Further recommendations to follow after MRI. Greater than 50% time during this 80-minute consultation visit was spent on counseling and coordination of care about her weakness numbness, hypertensive urgency and answering questions.  Discussed with Dr. Tammi Klippel and Dr. Ralene Bathe. Antony Contras 04/14/2019, 2:11 PM    Note: This document was prepared with digital dictation  and possible smart phrase technology. Any transcriptional errors that result from this process are unintentional.

## 2019-04-14 NOTE — Evaluation (Signed)
Physical Therapy Evaluation Patient Details Name: Brooke Collier MRN: [id number] DOB: [birth year] Today's Date: 04/14/2019   History of Present Illness  Pt is an 84 y/o female admitted secondary to weakness in LEs (LLE>RLE). 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 PMH includes HTN, and interstitial cystitis.   Clinical Impression  Pt admitted secondary to problem above with deficits below. Pt requiring mod A to take side steps at EOB. Reports difficulty taking steps with LLE during side steps. Feel pt would benefit from post acute rehab, however, pt reports she would like to think about it. Will reassess during next session and update recommendations according to pt progress. Will continue to follow acutely.     Follow Up Recommendations Other (comment)(CIR vs HHPT )    Equipment Recommendations  Other (comment)(TBD)    Recommendations for Other Services       Precautions / Restrictions Precautions Precautions: Fall Restrictions Weight Bearing Restrictions: No      Mobility  Bed Mobility Overal bed mobility: Needs Assistance Bed Mobility: Supine to Sit;Sit to Supine     Supine to sit: Min assist Sit to supine: Min assist   General bed mobility comments: Min A for trunk and LE assist.   Transfers Overall transfer level: Needs assistance Equipment used: 1 person hand held assist Transfers: Sit to/from Stand Sit to Stand: Mod assist         General transfer comment: PT stood in front of pt and had pt hold to PT arms. Mod A for steadying assist and manual blocking of L knee.   Ambulation/Gait Ambulation/Gait assistance: Mod assist   Assistive device: 1 person hand held assist       General Gait Details: Side steps at EOB requiring mod A for steadying. Pt with difficulty lifting LLE to take steps.   Stairs            Wheelchair Mobility    Modified Rankin (Stroke Patients Only) Modified Rankin (Stroke Patients Only) Pre-Morbid Rankin Score: No  symptoms Modified Rankin: Moderately severe disability     Balance Overall balance assessment: Needs assistance Sitting-balance support: No upper extremity supported;Feet supported Sitting balance-Leahy Scale: Fair     Standing balance support: Bilateral upper extremity supported;During functional activity Standing balance-Leahy Scale: Poor Standing balance comment: Reliant on UE and external support                              Pertinent Vitals/Pain Pain Assessment: No/denies pain    Home Living Family/patient expects to be discharged to:: Private residence Living Arrangements: Alone Available Help at Discharge: Family;Available PRN/intermittently Type of Home: House Home Access: Ramped entrance     Home Layout: Two level;Able to live on main level with bedroom/bathroom Home Equipment: Gilford Rile - 2 wheels;Cane - single point;Shower seat      Prior Function Level of Independence: Independent               Hand Dominance        Extremity/Trunk Assessment   Upper Extremity Assessment Upper Extremity Assessment: Overall WFL for tasks assessed    Lower Extremity Assessment Lower Extremity Assessment: Generalized weakness;LLE deficits/detail LLE Deficits / Details: Increased weaknes noted in LLE, especially functionally. Pt with difficulty lifting LLE to take a step.        Communication   Communication: No difficulties  Cognition Arousal/Alertness: Awake/alert Behavior During Therapy: WFL for tasks assessed/performed Overall Cognitive Status: Within Functional Limits for tasks assessed  General Comments General comments (skin integrity, edema, etc.): Discussed rehab options with pt, but pt reports she wants to "think about it"     Exercises     Assessment/Plan    PT Assessment Patient needs continued PT services  PT Problem List Decreased strength;Decreased balance;Decreased  mobility;Decreased coordination;Decreased activity tolerance;Decreased knowledge of use of DME;Decreased knowledge of precautions       PT Treatment Interventions Gait training;Functional mobility training;DME instruction;Therapeutic activities;Therapeutic exercise;Balance training;Neuromuscular re-education;Patient/family education    PT Goals (Current goals can be found in the Care Plan section)  Acute Rehab PT Goals Patient Stated Goal: to be able to walk better PT Goal Formulation: With patient Time For Goal Achievement: 04/28/19 Potential to Achieve Goals: Good    Frequency Min 4X/week   Barriers to discharge        Co-evaluation               AM-PAC PT "6 Clicks" Mobility  Outcome Measure Help needed turning from your back to your side while in a flat bed without using bedrails?: A Little Help needed moving from lying on your back to sitting on the side of a flat bed without using bedrails?: A Little Help needed moving to and from a bed to a chair (including a wheelchair)?: A Lot Help needed standing up from a chair using your arms (e.g., wheelchair or bedside chair)?: A Lot Help needed to walk in hospital room?: A Lot Help needed climbing 3-5 steps with a railing? : A Lot 6 Click Score: 14    End of Session Equipment Utilized During Treatment: Gait belt Activity Tolerance: Patient tolerated treatment well Patient left: in bed;with call bell/phone within reach Nurse Communication: Mobility status PT Visit Diagnosis: Unsteadiness on feet (R26.81);Muscle weakness (generalized) (M62.81)    Time: [id number] PT Time Calculation (min) (ACUTE ONLY): 18 min   Charges:   PT Evaluation $PT Eval Moderate Complexity: 1 Mod          Reuel Derby, PT, DPT  Acute Rehabilitation Services  Pager: [phone] Office: [phone]   Rudean Hitt 04/14/2019, 4:20 PM

## 2019-04-14 NOTE — ED Notes (Signed)
Pt taken to xray then refused xray stating she didn't see the need for a chest xray and wanted to talk to her dr Beverlee Nims the xray.

## 2019-04-14 NOTE — ED Notes (Signed)
Hook patient back up to the monitor got vitals put patient on bedpan patient is resting with call bell in reach

## 2019-04-14 NOTE — H&P (Addendum)
[facility] Admission History and Physical Service Pager: [phone]  Patient name: Brooke Collier Medical record number: [id number] Date of birth: [birth year] Age: 84 y.o. Gender: female  Primary Care Provider: Laurey Morale, MD Consultants: Neurology  Code Status: Full  Preferred Emergency Contact: April (daughter) [phone]  Chief Complaint: LE weakness   Assessment and Plan: Brooke Collier is a 84 y.o. female presenting with LE weakness/numbess beginning at 9AM this morning . PMH is significant for HTN, interstitial cystitis, anxiety  LE weakness Patient presenting with bilateral lower extremity weakness, left>right.  Patient initially started symptoms at 9 AM.  Initially had elevated blood pressures per EMS, lowered as she arrived to the emergency department.  Code stroke was called on patient's arrival to ED.  CT head was obtained and was stable from previous, no acute bleeds. Confirmed with Dr. Nevada Crane, reading radiologist. RF for stroke include h/o uncontrolled HTN. BP 170/79 on arrival, Q000111Q systolic when I was in room.   Dr. Leonie Man evaluated patient in the emergency department and recommended MRI head.  I spoke to him in patient's room and he states he does not think this is necessarily a stroke. Thinks this may be a symptom of her hypertension.  Recommended aspirin without Plavix an MRI but no further work-up at this time.  Further work-up will be dependent on MRI results. Symptoms improved after arrival to ED. On physical exam patient with 5/5 strength in RLE and 4/5 strength in LLE. Full strength in UE bilaterally. Otherwise normal neuro exam. Oriented x4.  - admit as tele- obs under attending Dr. Gwendlyn Deutscher  - vitals q2 x12 hrs, then q4h - neuro following; appreciate recs - monitor on telemetry - will not order echo, carotid dopplers per discussion with Dr. Leonie Man.  - permissive hypertension x24 hours - risk strat labs: hgb A1C, lipid - swallow  eval - PT/OT/SLP - atorvastatin - ASA 325mg  Qd - no Plavix per discussion with Dr. Leonie Man   HTN Patient reports recent labile blood pressures at home. Is very compliant on her medications. Home meds include benicar 40mg  qhs, norvasc 2.5mg  po qam. States she takes both in AM. Patient is unsure if she took both this AM but states she took at least 1 of the pills. BP elevated on arrival and currently hypertensive to Q000111Q systolic in the room. Per PCP telemedicine note from 02/17/19, last 3 encounter BP readings were 160/76, 154/68, 130/58.  - hold home meds to allow permissive HTN  Prolonged QTC EKG with QTc 524. Normal QTC in 2018.  - AM EKG - avoid QTC prolonging agents   Interstitial cystitis  Patient reports she follows with outpatient urology and takes an unknown pill. Was unable to determine the name. PCP notes list cysta q.  - hold home meds - f/u as outpatient   Anxiety  Patient reports she has had increased anxiety recently secondary to coronavirus. Is avoiding going to her PCP in person secondary to fear of obtaining the virus. Home meds include Xanax tablet twice daily and was advised to start Lexapro. Patient denied taking any medications to me. Will await med rec by pharmacy to clarify - holding home meds - CIWA - no ativan ordered  FEN/GI: NPO pending swallow eval  Prophylaxis: lovenox   Disposition: admit to tele obs, attending Dr. Gwendlyn Deutscher   History of Present Illness:  Brooke Collier is a 84 y.o. female presenting with LE weakness.   Patient states she noticed her legs were  numb and hanging and couldn't walk. She had just finished eating a bowl of oatmeal. When she got up she felt numbness but was able to walk into kitchen. She got herself a cup of water and started to take her BP meds and calcium pills and noticed she was getting more numb. Unsure if she took all her medications or not. Thinks she took ASA. Patient called daughter because she was unsure what was going  on. Was unsure if she was having a stroke. Daughter called ambulance. Had to crawl to the door to open the door.   Yesterday had painful back pain, this is chronic. Of note patient reports that her blood pressure have been labile at home. States that they can go up and down.  Review Of Systems: Per HPI with the following additions:   Review of Systems  Constitutional: Positive for chills. Negative for fever.  Eyes: Negative for blurred vision and double vision.  Respiratory: Negative for cough and shortness of breath.   Cardiovascular: Negative for chest pain.  Gastrointestinal: Negative for diarrhea, nausea and vomiting.  Genitourinary: Negative for dysuria, frequency and hematuria.  Musculoskeletal: Positive for back pain.  Neurological: Positive for dizziness, focal weakness, weakness and headaches.    Patient Active Problem List   Diagnosis Date Noted  . Weakness of extremity 04/14/2019  . Protein-calorie malnutrition, severe 05/06/2016  . Postoperative anemia due to acute blood loss   . Hip fracture ([place]) 05/04/2016  . Closed comminuted intertrochanteric fracture of proximal end of left femur ([place])   . HYPOTHYROIDISM 11/17/2008  . UNSPECIFIED VITAMIN D DEFICIENCY 11/17/2008  . HLD (hyperlipidemia) 11/17/2008  . ANEMIA 11/17/2008  . 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 Essential hypertension 07/21/2006  . ALLERGIC RHINITIS 07/21/2006  . ARTHRITIS 07/21/2006    Past Medical History: Past Medical History:  Diagnosis Date  . Allergy   . Chronic interstitial cystitis   . Hypertension     Past Surgical History: Past Surgical History:  Procedure Laterality Date  . ABDOMINAL HYSTERECTOMY    . INTRAMEDULLARY (IM) NAIL INTERTROCHANTERIC  05/04/2016  . INTRAMEDULLARY (IM) NAIL INTERTROCHANTERIC Left 05/04/2016   Procedure: INTRAMEDULLARY (IM) NAIL INTERTROCHANTRIC;  Surgeon: Mcarthur Rossetti, MD;  Location: [facility];  Service: Orthopedics;  Laterality: Left;  .  tonsillectomyy      Social History: Social History   Tobacco Use  . Smoking status: Never Smoker  . Smokeless tobacco: Never Used  Substance Use Topics  . Alcohol use: No  . Drug use: No   Additional social history: lives alone, denies drug, alcohol or tobacco use Please also refer to relevant sections of EMR.  Family History: Family History  Problem Relation Age of Onset  . Heart disease Mother   . Heart disease Father   . Anxiety disorder Sister   . Heart disease Brother   . Anxiety disorder Brother       Allergies and Medications: Allergies  Allergen Reactions  . Ciprofloxacin     Muscle weakness   No current facility-administered medications on file prior to encounter.   Current Outpatient Medications on File Prior to Encounter  Medication Sig Dispense Refill  . acetaminophen (TYLENOL) 325 MG tablet Take 2 tablets (650 mg total) by mouth every 6 (six) hours as needed (Pain, Hadache or Fever >/= 101).    [name] amLODipine (NORVASC) 2.5 MG tablet Take 2.5 mg by mouth daily.    [name] aspirin EC 325 MG EC tablet Take 1 tablet (325 mg total) by mouth  daily with breakfast. 30 tablet 0  . Calcium Carb-Cholecalciferol (CALCIUM-VITAMIN D) 500-200 MG-UNIT tablet Take 1 tablet by mouth daily.    . Calcium Carbonate (CALCIUM 500 PO) Take by mouth daily.      [name] Co-Enzyme Q-10 30 MG CAPS Take 30 mg by mouth daily.    . iron polysaccharides (NIFEREX) 150 MG capsule Take 1 capsule (150 mg total) by mouth daily.      Objective: BP (!) 170/79   Pulse 66   Temp 98.6 F (37 C) (Oral)   Resp 14   Ht 5\' 5"  (1.651 m)   Wt 44.4 kg   SpO2 100%   BMI 16.29 kg/m  Exam: General: awake and alert, laying in bed, NAD Eyes: PERRL, EOMI  ENTM: moist mucous membranes Neck: full ROM  Cardiovascular: RRR, no MRG  Respiratory: CTAB, no wheezes, rales, or rhonchi  Gastrointestinal: soft, non tender, non distended, bowel sounds present  MSK: no edema, 5/5 strength in RLE, 4/5 strength in LLE,  normal grip strength, 5/5 strength in UE bilaterally  Derm: intact, no rashes  Neuro: no focal deficits, sensation intact, CN2-12 intact  Psych: normal affect   Labs and Imaging: CBC BMET  Recent Labs  Lab 04/14/19 1052 04/14/19 1052 04/14/19 1058  WBC 4.2  --   --   HGB 14.4   < > 13.9  HCT 43.9   < > 41.0  PLT 193  --   --    < > = values in this interval not displayed.   Recent Labs  Lab 04/14/19 1052 04/14/19 1052 04/14/19 1058  NA 142   < > 141  K 3.5   < > 3.5  CL 103   < > 102  CO2 27  --   --   BUN 16   < > 19  CREATININE 0.90   < > 0.70  GLUCOSE 86   < > 83  CALCIUM 9.2  --   --    < > = values in this interval not displayed.     MR BRAIN WO CONTRAST  Result Date: 04/14/2019 CLINICAL DATA:  84 year old female code stroke, left-sided weakness. EXAM: MRI HEAD WITHOUT CONTRAST TECHNIQUE: Multiplanar, multiecho pulse sequences of the brain and surrounding structures were obtained without intravenous contrast. COMPARISON:  Plain head CT earlier today. FINDINGS: Brain: There is a small 8 mm focus of restricted diffusion in the dorsal right deep gray nuclei, lateral thalamus near the posterior limb of the right internal capsule (series 5, image 69). No significant T2 or FLAIR hyperintensity at this time. Questionable punctate contralateral anterior left thalamic focus of restricted diffusion (series 7, image 54). No evidence of associated acute hemorrhage.  No mass effect. Underlying bilateral thalamic and basal ganglia T2 and FLAIR heterogeneity. Scattered chronic microhemorrhages, including in both occipital lobes, left perirolandic cortex on series 14, image 37. Confluent bilateral cerebral white matter T2 and FLAIR hyperintensity. No cortical encephalomalacia identified. No other restricted diffusion. No midline shift, mass effect, evidence of mass lesion, ventriculomegaly, extra-axial collection or acute intracranial hemorrhage. Cervicomedullary junction and pituitary are  within normal limits. Mild T2 heterogeneity in the pons. Vascular: Major intracranial vascular flow voids are preserved. The right vertebral artery is dominant. Skull and upper cervical spine: Negative for age visible cervical spine. Normal bone marrow signal. Sinuses/Orbits: Postoperative changes to the left globe. Paranasal sinuses and mastoids are stable and well pneumatized. Other: Grossly normal internal auditory structures. Scalp and face soft tissues appear negative. IMPRESSION:  1. Acute on chronic small vessel ischemia in the thalami: An 8 mm right lateral thalamic/internal capsule lacune is noted along with evidence of a punctate contralateral left ventral thalamic infarct. 2. No associated acute hemorrhage or mass effect. Occasional chronic micro hemorrhages in conjunction with underlying chronic small vessel disease. Electronically Signed   By: Genevie Ann M.D.   On: 04/14/2019 12:58   CT HEAD CODE STROKE WO CONTRAST  Result Date: 04/14/2019 CLINICAL DATA:  Code stroke. 84 year old female with left side weakness, last seen normal 0900 hours. EXAM: CT HEAD WITHOUT CONTRAST TECHNIQUE: Contiguous axial images were obtained from the base of the skull through the vertex without intravenous contrast. COMPARISON:  Head CT 05/04/2016. FINDINGS: Brain: No midline shift, mass effect, or evidence of intracranial mass lesion. No ventriculomegaly. No acute intracranial hemorrhage identified. Confluent bilateral cerebral white matter hypodensity has not significantly changed since 2018. Posterior deep white matter capsule involvement as before. Lesser deep gray nuclei heterogeneity also appears stable. No superimposed acute cortically based infarct identified. Vascular: Calcified atherosclerosis at the skull base. No suspicious intracranial vascular hyperdensity. Skull: No acute osseous abnormality identified. Sinuses/Orbits: Visualized paranasal sinuses and mastoids are stable and well pneumatized. Other: Postoperative  changes to the left globe since 2018. No acute orbit or scalp soft tissue findings. ASPECTS [facility] Stroke Program Early CT Score) Total score (0-10 with 10 being normal): 10 IMPRESSION: 1. No acute cortically based infarct or acute intracranial hemorrhage identified. ASPECTS 10. 2. Chronic small vessel disease appears stable by CT since 2018. 3. These results were communicated to Dr. Royal Hawthorn at 11:11 am on 04/14/2019 by text page via the [facility] messaging system. Electronically Signed   By: Genevie Ann M.D.   On: 04/14/2019 11:11    EKG: Ventricular rate 72, QTc 524   Caroline More, DO 04/14/2019, 1:05 PM PGY-3, [facility] pager: [phone], text pages welcome

## 2019-04-14 NOTE — ED Triage Notes (Signed)
Pt arrived via [facility] and states patient aware she was normal at 0900 today and then shortly after started having weakness in both legs but left greater than right.  She called daughter and states she spoke normal.  The patient bp 210/100 on arrival by ems and then reduced to SBP 180.  On arrival to ED, CBG was 76 and alert and oriented.

## 2019-04-15 ENCOUNTER — Observation Stay (HOSPITAL_COMMUNITY): Payer: Medicare HMO

## 2019-04-15 ENCOUNTER — Inpatient Hospital Stay (HOSPITAL_COMMUNITY): Payer: Medicare HMO

## 2019-04-15 DIAGNOSIS — R29704 NIHSS score 4: Secondary | ICD-10-CM | POA: Diagnosis not present

## 2019-04-15 DIAGNOSIS — G8929 Other chronic pain: Secondary | ICD-10-CM | POA: Diagnosis present

## 2019-04-15 DIAGNOSIS — I351 Nonrheumatic aortic (valve) insufficiency: Secondary | ICD-10-CM | POA: Diagnosis not present

## 2019-04-15 DIAGNOSIS — G8191 Hemiplegia, unspecified affecting right dominant side: Secondary | ICD-10-CM | POA: Diagnosis present

## 2019-04-15 DIAGNOSIS — M1990 Unspecified osteoarthritis, unspecified site: Secondary | ICD-10-CM | POA: Diagnosis present

## 2019-04-15 DIAGNOSIS — I10 Essential (primary) hypertension: Secondary | ICD-10-CM | POA: Diagnosis present

## 2019-04-15 DIAGNOSIS — E039 Hypothyroidism, unspecified: Secondary | ICD-10-CM | POA: Diagnosis present

## 2019-04-15 DIAGNOSIS — I6522 Occlusion and stenosis of left carotid artery: Secondary | ICD-10-CM | POA: Diagnosis present

## 2019-04-15 DIAGNOSIS — I361 Nonrheumatic tricuspid (valve) insufficiency: Secondary | ICD-10-CM | POA: Diagnosis not present

## 2019-04-15 DIAGNOSIS — J309 Allergic rhinitis, unspecified: Secondary | ICD-10-CM | POA: Diagnosis present

## 2019-04-15 DIAGNOSIS — E785 Hyperlipidemia, unspecified: Secondary | ICD-10-CM | POA: Diagnosis present

## 2019-04-15 DIAGNOSIS — I739 Peripheral vascular disease, unspecified: Secondary | ICD-10-CM | POA: Diagnosis present

## 2019-04-15 DIAGNOSIS — N3010 Interstitial cystitis (chronic) without hematuria: Secondary | ICD-10-CM | POA: Diagnosis present

## 2019-04-15 DIAGNOSIS — E559 Vitamin D deficiency, unspecified: Secondary | ICD-10-CM | POA: Diagnosis present

## 2019-04-15 DIAGNOSIS — M549 Dorsalgia, unspecified: Secondary | ICD-10-CM | POA: Diagnosis present

## 2019-04-15 DIAGNOSIS — R29898 Other symptoms and signs involving the musculoskeletal system: Secondary | ICD-10-CM | POA: Diagnosis not present

## 2019-04-15 DIAGNOSIS — G8194 Hemiplegia, unspecified affecting left nondominant side: Secondary | ICD-10-CM | POA: Diagnosis present

## 2019-04-15 DIAGNOSIS — N3020 Other chronic cystitis without hematuria: Secondary | ICD-10-CM | POA: Diagnosis not present

## 2019-04-15 DIAGNOSIS — Z20822 Contact with and (suspected) exposure to covid-19: Secondary | ICD-10-CM | POA: Diagnosis present

## 2019-04-15 DIAGNOSIS — I6330 Cerebral infarction due to thrombosis of unspecified cerebral artery: Secondary | ICD-10-CM

## 2019-04-15 DIAGNOSIS — R29703 NIHSS score 3: Secondary | ICD-10-CM | POA: Diagnosis not present

## 2019-04-15 DIAGNOSIS — I639 Cerebral infarction, unspecified: Secondary | ICD-10-CM | POA: Diagnosis not present

## 2019-04-15 DIAGNOSIS — G2581 Restless legs syndrome: Secondary | ICD-10-CM | POA: Diagnosis present

## 2019-04-15 DIAGNOSIS — R29702 NIHSS score 2: Secondary | ICD-10-CM | POA: Diagnosis not present

## 2019-04-15 DIAGNOSIS — I69398 Other sequelae of cerebral infarction: Secondary | ICD-10-CM | POA: Diagnosis not present

## 2019-04-15 DIAGNOSIS — Z90710 Acquired absence of both cervix and uterus: Secondary | ICD-10-CM | POA: Diagnosis not present

## 2019-04-15 DIAGNOSIS — I160 Hypertensive urgency: Secondary | ICD-10-CM | POA: Diagnosis present

## 2019-04-15 DIAGNOSIS — R29700 NIHSS score 0: Secondary | ICD-10-CM | POA: Diagnosis present

## 2019-04-15 DIAGNOSIS — F411 Generalized anxiety disorder: Secondary | ICD-10-CM | POA: Diagnosis present

## 2019-04-15 DIAGNOSIS — R9431 Abnormal electrocardiogram [ECG] [EKG]: Secondary | ICD-10-CM | POA: Diagnosis present

## 2019-04-15 DIAGNOSIS — I6381 Other cerebral infarction due to occlusion or stenosis of small artery: Secondary | ICD-10-CM | POA: Diagnosis present

## 2019-04-15 LAB — CBC
HCT: 36.4 % (ref 36.0–46.0)
Hemoglobin: 12.2 g/dL (ref 12.0–15.0)
MCH: 30.9 pg (ref 26.0–34.0)
MCHC: 33.5 g/dL (ref 30.0–36.0)
MCV: 92.2 fL (ref 80.0–100.0)
Platelets: 200 10*3/uL (ref 150–400)
RBC: 3.95 MIL/uL (ref 3.87–5.11)
RDW: 13.6 % (ref 11.5–15.5)
WBC: 4.5 10*3/uL (ref 4.0–10.5)
nRBC: 0 % (ref 0.0–0.2)

## 2019-04-15 LAB — BASIC METABOLIC PANEL
Anion gap: 11 (ref 5–15)
BUN: 20 mg/dL (ref 8–23)
CO2: 26 mmol/L (ref 22–32)
Calcium: 8.8 mg/dL — ABNORMAL LOW (ref 8.9–10.3)
Chloride: 105 mmol/L (ref 98–111)
Creatinine, Ser: 0.75 mg/dL (ref 0.44–1.00)
GFR calc Af Amer: >60 mL/min (ref >60)
GFR calc non Af Amer: >60 mL/min (ref >60)
Glucose, Bld: 97 mg/dL (ref 70–99)
Potassium: 3.9 mmol/L (ref 3.5–5.1)
Sodium: 142 mmol/L (ref 135–145)

## 2019-04-15 LAB — LIPID PANEL
Cholesterol: 192 mg/dL (ref 0–200)
HDL: 71 mg/dL (ref >40)
LDL Cholesterol: 109 mg/dL — ABNORMAL HIGH (ref 0–99)
Total CHOL/HDL Ratio: 2.7 RATIO
Triglycerides: 62 mg/dL (ref <150)
VLDL: 12 mg/dL (ref 0–40)

## 2019-04-15 LAB — ECHOCARDIOGRAM COMPLETE
Height: 64 in
Weight: 1502.66 oz

## 2019-04-15 LAB — HEMOGLOBIN A1C
Hgb A1c MFr Bld: 5 % (ref 4.8–5.6)
Mean Plasma Glucose: 96.8 mg/dL

## 2019-04-15 MED ORDER — HYDROXYZINE HCL 10 MG PO TABS
10.0000 mg | ORAL_TABLET | Freq: Three times a day (TID) | ORAL | Status: DC | PRN
  Administered 2019-04-15: 10 mg via ORAL
  Filled 2019-04-15: qty 1

## 2019-04-15 MED ORDER — ONDANSETRON HCL 4 MG PO TABS
4.0000 mg | ORAL_TABLET | Freq: Once | ORAL | Status: AC
  Administered 2019-04-15: 4 mg via ORAL
  Filled 2019-04-15: qty 1

## 2019-04-15 MED ORDER — CLOPIDOGREL BISULFATE 75 MG PO TABS
75.0000 mg | ORAL_TABLET | Freq: Every day | ORAL | Status: DC
  Administered 2019-04-15 – 2019-04-19 (×5): 75 mg via ORAL
  Filled 2019-04-15 (×5): qty 1

## 2019-04-15 MED ORDER — ASPIRIN EC 81 MG PO TBEC
81.0000 mg | DELAYED_RELEASE_TABLET | Freq: Every day | ORAL | Status: DC
  Administered 2019-04-16 – 2019-04-19 (×4): 81 mg via ORAL
  Filled 2019-04-15 (×4): qty 1

## 2019-04-15 MED ORDER — IOHEXOL 350 MG/ML SOLN
100.0000 mL | Freq: Once | INTRAVENOUS | Status: AC | PRN
  Administered 2019-04-15: 100 mL via INTRAVENOUS

## 2019-04-15 NOTE — Progress Notes (Signed)
Physical Therapy Treatment Patient Details Name: Brooke Collier MRN: [id number] DOB: Jan 12, 1936 Today's Date: 04/15/2019    History of Present Illness Pt is an 84 y/o female admitted secondary to weakness in LEs (LLE>RLE). 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 PMH includes HTN, and interstitial cystitis.     PT Comments    Pt reports nausea and dizziness at rest. BP sitting 152/81, standing 138/82.  Performed sit to stand x 3 trials with +2 mod A. Pt has posterior and L lateral lean in sitting and standing, weakness and incoordination noted LUE/LLE, she report B feet feel numb. Pt took 3 side steps at edge of bed with RW and assist to block L knee 2* buckling. Activity tolerance limited by nausea/dizziness/fatigue. Pt puts forth good effort. She is a good CIR candidate.   Follow Up Recommendations  CIR     Equipment Recommendations  Other (comment)(TBD)    Recommendations for Other Services       Precautions / Restrictions Precautions Precautions: Fall Precaution Comments: unsteady in sitting and standing Restrictions Weight Bearing Restrictions: No    Mobility  Bed Mobility Overal bed mobility: Needs Assistance Bed Mobility: Supine to Sit;Sit to Supine     Supine to sit: Min assist Sit to supine: Min assist   General bed mobility comments: Min A for trunk and LE assist.   Transfers Overall transfer level: Needs assistance Equipment used: Rolling walker (2 wheeled) Transfers: Sit to/from Stand Sit to Stand: Mod assist         General transfer comment: Mod A to static stand from EOB with RW,  left side weakness noted. Sit to stand x 3. Pt stood ~60 seconds each trial. Pt reports dizziness in sitting and standing, see orthostatic vitals.  Ambulation/Gait Ambulation/Gait assistance: +2 physical assistance;+2 safety/equipment;Mod assist Gait Distance (Feet): 3 Feet Assistive device: Rolling walker (2 wheeled) Gait Pattern/deviations: Step-to pattern;Narrow  base of support Gait velocity: decr   General Gait Details: 3 side steps to L at edge of bed with RW and blocking of L knee 2* buckling; also performed marching in standing with RW, incoordination of LLE noted but pt able to clear L foot from floor, pt reports B feet feel numb   Stairs             Wheelchair Mobility    Modified Rankin (Stroke Patients Only)       Balance Overall balance assessment: Needs assistance Sitting-balance support: No upper extremity supported;Feet supported Sitting balance-Leahy Scale: Fair Sitting balance - Comments: tendency to lean to the left, able to self correct  Postural control: Posterior lean;Left lateral lean Standing balance support: Bilateral upper extremity supported;During functional activity Standing balance-Leahy Scale: Poor Standing balance comment: Reliant on UE and external support; posterior lean initially                            Cognition Arousal/Alertness: Awake/alert Behavior During Therapy: WFL for tasks assessed/performed Overall Cognitive Status: Within Functional Limits for tasks assessed                                        Exercises      General Comments        Pertinent Vitals/Pain      Home Living Family/patient expects to be discharged to:: Private residence Living Arrangements: Alone Available Help at Discharge: Family;Available PRN/intermittently  Type of Home: House Home Access: Ramped entrance   Home Layout: Two level;Able to live on main level with bedroom/bathroom Home Equipment: Gilford Rile - 2 wheels;Cane - single point;Shower seat      Prior Function Level of Independence: Independent          PT Goals (current goals can now be found in the care plan section) Acute Rehab PT Goals Patient Stated Goal: to be able to walk better; to DC home PT Goal Formulation: With patient Time For Goal Achievement: 04/28/19 Potential to Achieve Goals: Good Progress towards  PT goals: Progressing toward goals    Frequency    Min 4X/week      PT Plan Discharge plan needs to be updated    Co-evaluation PT/OT/SLP Co-Evaluation/Treatment: Yes Reason for Co-Treatment: Complexity of the patient's impairments (multi-system involvement);For patient/therapist safety PT goals addressed during session: Mobility/safety with mobility;Balance;Proper use of DME OT goals addressed during session: ADL's and self-care      AM-PAC PT "6 Clicks" Mobility   Outcome Measure  Help needed turning from your back to your side while in a flat bed without using bedrails?: A Little Help needed moving from lying on your back to sitting on the side of a flat bed without using bedrails?: A Little Help needed moving to and from a bed to a chair (including a wheelchair)?: A Lot Help needed standing up from a chair using your arms (e.g., wheelchair or bedside chair)?: A Lot Help needed to walk in hospital room?: A Lot Help needed climbing 3-5 steps with a railing? : A Lot 6 Click Score: 14    End of Session Equipment Utilized During Treatment: Gait belt Activity Tolerance: Patient tolerated treatment well Patient left: in bed;with call bell/phone within reach;with bed alarm set Nurse Communication: Mobility status PT Visit Diagnosis: Unsteadiness on feet (R26.81);Muscle weakness (generalized) (M62.81)     Time: [id number] PT Time Calculation (min) (ACUTE ONLY): 30 min  Charges:  $Therapeutic Activity: 8-22 mins                     Blondell Reveal Kistler PT 04/15/2019  Acute Rehabilitation Services Pager [phone] Office [phone]

## 2019-04-15 NOTE — Progress Notes (Signed)
Rehab Admissions Coordinator Note:  Per PT and OT recommendation, this patient was screened by Raechel Ache for appropriateness for an Inpatient Acute Rehab Consult.  At this time, we are recommending an Inpatient Rehab consult. AC will contact MD to request order.   Raechel Ache 04/15/2019, 12:07 PM  I can be reached at [phone].

## 2019-04-15 NOTE — Evaluation (Signed)
Occupational Therapy Evaluation Patient Details Name: Brooke Collier MRN: [id number] DOB: Nov 05, 1935 Today's Date: 04/15/2019    History of Present Illness Pt is an 84 y/o female admitted secondary to weakness in LEs (LLE>RLE). 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 PMH includes HTN, and interstitial cystitis.    Clinical Impression   Pt admitted with the above diagnoses and presents with below problem list. Pt will benefit from continued acute OT to address the below listed deficits and maximize independence with basic ADLs prior to d/c to venue below. PTA pt was independent with ADLs. She lives alone but has a daughter who lives locally. Pt presents with L side weakness, dysmetria, decreased proprioception, and decreased activity tolerance impacting ADLs. Pt is currently min A with UB ADLs, mod-max +2 for safety with LB ADLs, mod-max A with stand-pivot transfers. Pt reporting nausea throughout session, nursing notified. Pt with lightheadedness/woozy-head with positional changes but no orthostatic drop in bp observed. Pt motivated to work with therapy, eager to return home.      Follow Up Recommendations  CIR    Equipment Recommendations  Other (comment)(defer to next venue)    Recommendations for Other Services Rehab consult     Precautions / Restrictions Precautions Precautions: Fall Precaution Comments: unsteady in sitting and standing Restrictions Weight Bearing Restrictions: No      Mobility Bed Mobility Overal bed mobility: Needs Assistance Bed Mobility: Supine to Sit;Sit to Supine     Supine to sit: Min assist Sit to supine: Min assist   General bed mobility comments: Min A for trunk and LE assist.   Transfers Overall transfer level: Needs assistance Equipment used: Rolling walker (2 wheeled) Transfers: Sit to/from Stand Sit to Stand: Mod assist         General transfer comment: Mod A to static stand from EOB with rw. left side wekaness noted.      Balance Overall balance assessment: Needs assistance Sitting-balance support: No upper extremity supported;Feet supported Sitting balance-Leahy Scale: Fair Sitting balance - Comments: tendency to lean to the left, able to self correct    Standing balance support: Bilateral upper extremity supported;During functional activity Standing balance-Leahy Scale: Poor Standing balance comment: Reliant on UE and external support                            ADL either performed or assessed with clinical judgement   ADL Overall ADL's : Needs assistance/impaired Eating/Feeding: Set up;Sitting;Minimal assistance Eating/Feeding Details (indicate cue type and reason): intermittent steadying assist to seat in unsupported sitting position (EOB) Grooming: Minimal assistance;Sitting   Upper Body Bathing: Minimal assistance;Sitting   Lower Body Bathing: Moderate assistance;Sit to/from stand;+2 for safety/equipment   Upper Body Dressing : Minimal assistance;Sitting   Lower Body Dressing: Moderate assistance;+2 for safety/equipment;Sit to/from stand   Toilet Transfer: Moderate assistance;Stand-pivot;Maximal assistance;BSC;RW   Toileting- Clothing Manipulation and Hygiene: Sit to/from stand;Moderate assistance;+2 for safety/equipment         General ADL Comments: Pt completed bed mobility, 2x sit<>stands, side stepped along EOB, and walked in place. 2 seated resting breaks incorporated. + lightheaded/swimmyheaded with positional chane but not objectively found to be orthostatic     Vision         Perception     Praxis      Pertinent Vitals/Pain       Hand Dominance     Extremity/Trunk Assessment Upper Extremity Assessment Upper Extremity Assessment: LUE deficits/detail LUE Deficits / Details: proprioceptive deficits  noted today, some dysmetria,  LUE Sensation: decreased proprioception   Lower Extremity Assessment Lower Extremity Assessment: Defer to PT evaluation        Communication Communication Communication: No difficulties   Cognition Arousal/Alertness: Awake/alert Behavior During Therapy: WFL for tasks assessed/performed Overall Cognitive Status: Within Functional Limits for tasks assessed                                     General Comments       Exercises     Shoulder Instructions      Home Living Family/patient expects to be discharged to:: Private residence Living Arrangements: Alone Available Help at Discharge: Family;Available PRN/intermittently Type of Home: House Home Access: Ramped entrance     Home Layout: Two level;Able to live on main level with bedroom/bathroom     Bathroom Shower/Tub: Walk-in shower   Bathroom Toilet: Handicapped height     Home Equipment: Environmental consultant - 2 wheels;Cane - single point;Shower seat          Prior Functioning/Environment Level of Independence: Independent                 OT Problem List: Decreased strength;Decreased activity tolerance;Impaired balance (sitting and/or standing);Decreased coordination;Decreased knowledge of use of DME or AE;Decreased knowledge of precautions;Impaired sensation;Impaired UE functional use;Pain      OT Treatment/Interventions: Self-care/ADL training;Therapeutic exercise;Neuromuscular education;DME and/or AE instruction;Therapeutic activities;Patient/family education;Balance training    OT Goals(Current goals can be found in the care plan section) Acute Rehab OT Goals Patient Stated Goal: home; walk better OT Goal Formulation: With patient Time For Goal Achievement: 04/29/19 Potential to Achieve Goals: Good ADL Goals Pt Will Perform Grooming: with modified independence;sitting Pt Will Perform Upper Body Bathing: with modified independence;sitting Pt Will Perform Lower Body Bathing: with min assist;sit to/from stand Pt Will Transfer to Toilet: with min assist;ambulating Pt Will Perform Toileting - Clothing Manipulation and hygiene:  with min assist;sit to/from stand Pt/caregiver will Perform Home Exercise Program: Independently;With written HEP provided;Left upper extremity  OT Frequency: Min 2X/week   Barriers to D/C:            Co-evaluation PT/OT/SLP Co-Evaluation/Treatment: Yes Reason for Co-Treatment: Complexity of the patient's impairments (multi-system involvement);For patient/therapist safety PT goals addressed during session: Mobility/safety with mobility;Balance;Proper use of DME OT goals addressed during session: ADL's and self-care      AM-PAC OT "6 Clicks" Daily Activity     Outcome Measure Help from another person eating meals?: None Help from another person taking care of personal grooming?: A Little Help from another person toileting, which includes using toliet, bedpan, or urinal?: A Little Help from another person bathing (including washing, rinsing, drying)?: A Lot Help from another person to put on and taking off regular upper body clothing?: A Little Help from another person to put on and taking off regular lower body clothing?: A Lot 6 Click Score: 17   End of Session Equipment Utilized During Treatment: Rolling walker Nurse Communication: Other (comment)(nausea)  Activity Tolerance: Patient limited by fatigue;Other (comment)(nausea throughout session) Patient left: in bed;with call bell/phone within reach;with bed alarm set  OT Visit Diagnosis: Unsteadiness on feet (R26.81);Other abnormalities of gait and mobility (R26.89);Ataxia, unspecified (R27.0);Other symptoms and signs involving the nervous system (R29.898);Hemiplegia and hemiparesis Hemiplegia - Right/Left: Left                Time: 1020-1100 OT Time Calculation (min): 40 min Charges:  OT General Charges $OT Visit: 1 Visit OT Evaluation $OT Eval Moderate Complexity: [place], OT Acute Rehabilitation Services Pager: [phone] Office: [phone]   Hortencia Pilar 04/15/2019, 11:18 AM

## 2019-04-15 NOTE — Progress Notes (Signed)
Was called to patient's room by patient's nurse.  Patient reported feeling "warm feeling" around her elbows.  She reported when it happened she felt anxiety.  The strange feeling has now almost resolved.  She reports having issues with anxiety in the past and feels mildly short of breath when she gets anxious.  Is comfortable at this time.  Will prescribe hydroxyzine as needed for anxiety.

## 2019-04-15 NOTE — Discharge Summary (Addendum)
[facility] Discharge Summary  Patient name: Brooke Collier record number: [id number] Date of birth: 26-May-1935 Age: 84 y.o. Gender: female Date of Admission: 04/14/2019  Date of Discharge: 04/19/18 Admitting Physician: Dickie La, MD  Primary Care Provider: Laurey Morale, MD Consultants: Neurology   Indication for Hospitalization: acute CVA   Discharge Diagnoses/Problem List:  Active Problems:   Weakness of extremity   Cerebral thrombosis with cerebral infarction   Anxiety state   Chronic cystitis HTN  Disposition: to CIR  Discharge Condition: stable and improved  Discharge Exam:  General: very thin older woman resting comfortably in bed  Cardiovascular: rrr, no m/r/g, 2+ radial pulses Respiratory: CTAB, no increased WOB, no wheezing, rhonchi Abdomen: soft, ND, tender to palpation in suprapubic region, normal bowel sounds present Extremities: thin appearing limbs, no edema or lesions Neuro:  Cranial Nerves: II: PERRL.  III,IV, VI: EOMI without ptosis or diplopia.  V: Facial sensation is symmetric to touch VII: Facial movement is symmetric.  VIII: hearing is intact to voice X: Palate elevates symmetrically XI: Shoulder shrug is symmetric. XII: tongue is midline without atrophy or fasciculations.  Motor: Tone is normal. Bulk is normal. 4/5 strength was present in all four extremities.  Sensory: Sensation is symmetric to light touch  Brief Hospital Course:  Brooke Collier is a 84 y.o. female presenting with LE weakness/numbess beginning at 9AM this morning . PMH is significant for HTN, interstitial cystitis and anxiety.   Acute CVA with LE Weakness Brooke Collier presented with bilateral lower extremity weakness and was found to have acute CVA involving her left ventral thalamic artery and chroic small vessel disease confirmed with brain MRI. CTA was completed and showed general arterial ectasia with 50% stenosis of supraclinoid  left ICA siphon and right thalamic lacunar infarct. Patient was evaluated by neurology who prescribed Plavix 300 mg followed by Plavix 75 mg daily to continue on discharge. She was also prescribed three weeks of ASA 81 mg to be discontinued on 05/06/19. Brooke Collier was evaluated by physical therapy and occupational therapy who recommended CIR upon discharge.  Patient with permissive hypertension protocol, home medications amlodipine and benicar restarted. However, because patient is below goal 140s/70s, benicar decreased to daily from twice daily.  Interstitial cystitis Patient with chronic history of interstitial cystitis that is managed by her outpatient urologist.  Patient was unsure what medications she takes but upon chart review patient appears to be taking Cyst-Q. recommended patient continue this as outpatient.  Patient also complained of dysuria and lower abdominal pain.  Urine cx with <30,000 CFUs. Patient notes that she has bladder pain when stressed, states that her pain was the same as her interstitial cystitis. Pt treated with   Anxiety Patient has elevated anxiety, especially regarding COVID-19 and has been isolating herself at home and not going to doctor's offices. She is on a small dose of xanax 0.125 as needed qhs for anxiety, discussed safety concern for BEERs list drug in her demographic. Patient wishes to continue. Buspar started. Patient does not want to try an SSRI at this time. Recommend therapy and follow up outpatient.  Issues for Follow Up:  1. Anxiety, Would recommend considering SSRI/SNRI for baseline control. Also recommend discontinuing or tapering down xanax. 2. Consider cognitive behavioral therapy as an outpatient 3.  HTN: restarted on benicar daily and amlodipine. Please follow up HTN and medication management.  Significant Procedures: none  Significant Labs and Imaging:  Recent Labs  Lab 04/14/19  1052 04/14/19 1052 04/14/19 1058 04/15/19 0337 04/16/19 0602   WBC 4.2  --   --  4.5 4.3  HGB 14.4   < > 13.9 12.2 12.6  HCT 43.9   < > 41.0 36.4 37.2  PLT 193  --   --  200 198   < > = values in this interval not displayed.   Recent Labs  Lab 04/14/19 1052 04/14/19 1052 04/14/19 1058 04/14/19 1058 04/15/19 0337 04/16/19 0602  NA 142  --  141  --  142 141  K 3.5   < > 3.5   < > 3.9 3.8  CL 103  --  102  --  105 108  CO2 27  --   --   --  26 26  GLUCOSE 86  --  83  --  97 91  BUN 16  --  19  --  20 16  CREATININE 0.90  --  0.70  --  0.75 0.91  CALCIUM 9.2  --   --   --  8.8* 8.7*  ALKPHOS 44  --   --   --   --   --   AST 36  --   --   --   --   --   ALT 27  --   --   --   --   --   ALBUMIN 3.9  --   --   --   --   --    < > = values in this interval not displayed.   MRI brain w/o contrast IMPRESSION: 1. Acute on chronic small vessel ischemia in the thalami: An 8 mm right lateral thalamic/internal capsule lacune is noted along with evidence of a punctate contralateral left ventral thalamic infarct.   2. No associated acute hemorrhage or mass effect. Occasional chronic micro hemorrhages in conjunction with underlying chronic small vessel disease.  Results/Tests Pending at Time of Discharge: none  Discharge Medications:  Allergies as of 04/19/2019       Reactions   Ciprofloxacin    Muscle weakness   Zoloft [sertraline] Nausea And Vomiting        Medication List     TAKE these medications    acetaminophen 325 MG tablet Commonly known as: TYLENOL Take 2 tablets (650 mg total) by mouth every 6 (six) hours as needed (Pain, Hadache or Fever >/= 101).   ALPRAZolam 0.25 MG tablet Commonly known as: XANAX Take 0.5 tablets (0.125 mg total) by mouth at bedtime as needed for anxiety. What changed:  when to take this reasons to take this   amLODipine 2.5 MG tablet Commonly known as: NORVASC Take 2.5 mg by mouth daily.   aspirin 81 MG EC tablet Take 1 tablet (81 mg total) by mouth daily with breakfast. Start taking on:  April 20, 2019 What changed:  medication strength how much to take Another medication with the same name was removed. Continue taking this medication, and follow the directions you see here.   atorvastatin 40 MG tablet Commonly known as: LIPITOR Take 1 tablet (40 mg total) by mouth daily at 6 PM.   busPIRone 5 MG tablet Commonly known as: BUSPAR Take 1 tablet (5 mg total) by mouth 2 (two) times daily.   CALCIUM 500 PO Take by mouth daily.   calcium-vitamin D 500-200 MG-UNIT tablet Take 1 tablet by mouth daily.   carboxymethylcellulose 0.5 % Soln Commonly known as: REFRESH PLUS Place 1 drop into both eyes daily as  needed (dry eyes).   clopidogrel 75 MG tablet Commonly known as: PLAVIX Take 1 tablet (75 mg total) by mouth daily. Start taking on: April 20, 2019   Co-Enzyme Q-10 30 MG Caps Take 30 mg by mouth daily.   CYSTEX URINARY HEALTH PO Take 1 tablet by mouth in the morning and at bedtime.   Magnesium 100 MG Tabs Take 100 mg by mouth daily.   olmesartan 40 MG tablet Commonly known as: BENICAR Take 0.5 tablets (20 mg total) by mouth daily. What changed: when to take this   phenazopyridine 100 MG tablet Commonly known as: PYRIDIUM Take 1 tablet (100 mg total) by mouth daily as needed (bladder pain).        Discharge Instructions: Please refer to Patient Instructions section of EMR for full details.  Patient was counseled important signs and symptoms that should prompt return to medical care, changes in medications, dietary instructions, activity restrictions, and follow up appointments.   Follow-Up Appointments: Follow-up Information     [facility] Follow up in 4 week(s).   Specialty: Neurology Why: stroke clinic. office will call with appt date and time.  Contact information: [address] [place] [phone]           Gladys Damme, MD 04/19/2019, 12:41 PM PGY-1, [place]    I have seen and examined this patient.     I have discussed the findings and exam with the intern and agree with the above note, which I have edited appropriately in Page. I helped develop the management plan that is described in the resident's note, and I agree with the content.   Doristine Mango, DO PGY-2 Family Medicine Resident

## 2019-04-15 NOTE — Evaluation (Addendum)
Speech Language Pathology Evaluation Patient Details Name: Brooke Collier MRN: [id number] DOB: September 28, 1935 Today's Date: 04/15/2019 Time: [id number] SLP Time Calculation (min) (ACUTE ONLY): 26 min  Problem List:  Patient Active Problem List   Diagnosis Date Noted  . Cerebral thrombosis with cerebral infarction 04/15/2019  . Weakness of extremity 04/14/2019  . Stroke ([place])   . Hypertensive urgency   . Protein-calorie malnutrition, severe 05/06/2016  . Postoperative anemia due to acute blood loss   . Hip fracture ([place]) 05/04/2016  . Closed comminuted intertrochanteric fracture of proximal end of left femur ([place])   . HYPOTHYROIDISM 11/17/2008  . UNSPECIFIED VITAMIN D DEFICIENCY 11/17/2008  . HLD (hyperlipidemia) 11/17/2008  . ANEMIA 11/17/2008  . 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 Essential hypertension 07/21/2006  . ALLERGIC RHINITIS 07/21/2006  . ARTHRITIS 07/21/2006   Past Medical History:  Past Medical History:  Diagnosis Date  . Allergy   . Chronic interstitial cystitis   . Hypertension    Past Surgical History:  Past Surgical History:  Procedure Laterality Date  . ABDOMINAL HYSTERECTOMY    . INTRAMEDULLARY (IM) NAIL INTERTROCHANTERIC  05/04/2016  . INTRAMEDULLARY (IM) NAIL INTERTROCHANTERIC Left 05/04/2016   Procedure: INTRAMEDULLARY (IM) NAIL INTERTROCHANTRIC;  Surgeon: Mcarthur Rossetti, MD;  Location: [facility] ORS;  Service: Orthopedics;  Laterality: Left;  . tonsillectomyy     HPI:  Pt is an 84 y/o female admitted secondary to weakness in LEs (LLE>RLE). 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 PMH includes HTN, anxiety and interstitial cystitis. Pt reports she had a rehab stay in a SNF in 2018 "after I broke my femur."  Assessment / Plan / Recommendation Clinical Impression   Patient encountered at bedside for speech/language evaluation. Patient alert, oriented x4. She endorses some mild word-finding difficulty and this is observed in conversation.  Minimal expressive deficits characterized by some circumlocution, nonspecific language. Confrontation naming skills intact, though patient requires increased time. Patient reports anomia that "comes and goes," but she reports it is not new. Pt unable to provide an approximate date for when these word finding problems began. Verbal initiation and automatic speech intact, patient able to functionally communicate wants/needs. Receptive language skills WFL. Pt able to answer basic and complex yes/no questions, follow multi-step verbal commands and engage appropriately in simple conversation. Portions of the COGNISTAT given to aid in this assessment, all scores obtained are [facility]. See below for further details. However, patient endorses some mild difficulty with recall and this is observed in informal conversation when patient attempting to provide her medical history. Patient reports feeling anxiety which may impact performance during this assessment. Pt shares that she has avoided leaving her home for about a year d/t fear of COVID-19 and is thus feeling anxiety about being in the hospital now. She states her daughter (lives nearby) has assisted with bringing her groceries and helping with other household tasks.  Based on today's assessment, suspect patient is at or close to baseline function in terms of expressive language and recall. Recommend follow-up at the next venue of care. No further ST recommended at the acute level.   COGNISTAT, all scores WFL unless otherwise specified Orientation: not given Repetition: 12/12 Attention: 7/8 Memory: 12/12 Naming: 8/8 Calculations: 4/4 Reasoning similarities: 6/6 Reasoning judgement: 4/6    SLP Assessment  SLP Recommendation/Assessment: All further Speech Lanaguage Pathology  needs can be addressed in the next venue of care SLP Visit Diagnosis: Aphasia (R47.01);Cognitive communication deficit (R41.841)    Follow Up Recommendations  Inpatient Rehab;Home health  SLP  Frequency and Duration           SLP Evaluation Cognition  Overall Cognitive Status: Within Functional Limits for tasks assessed Orientation Level: Oriented X4       Comprehension  Auditory Comprehension Overall Auditory Comprehension: Appears within functional limits for tasks assessed Visual Recognition/Discrimination Discrimination: Not tested Reading Comprehension Reading Status: Not tested    Expression Expression Primary Mode of Expression: Verbal Verbal Expression Overall Verbal Expression: Other (comment)(pt endores word-finding problems, unsure of baseline) Initiation: No impairment Automatic Speech: Counting;Social Response;Name Level of Generative/Spontaneous Verbalization: Conversation Repetition: No impairment Written Expression Dominant Hand: Right Written Expression: Not tested   Oral / Motor  Oral Motor/Sensory Function Overall Oral Motor/Sensory Function: Within functional limits Motor Speech Overall Motor Speech: Appears within functional limits for tasks assessed   GO                    Jhostin Epps 04/15/2019, 4:17 PM  Marina Goodell, M.Ed., [place] [facility] Acute Rehabilitation [phone]: Acute Rehab office [phone] - pager

## 2019-04-15 NOTE — Progress Notes (Signed)
Family Medicine Teaching Service Daily Progress Note Intern Pager: [phone]  Patient name: Brooke Collier Medical record number: [id number] Date of birth: 15-May-1935 Age: 84 y.o. Gender: female  Primary Care Provider: Laurey Morale, MD Consultants: Neurology Code Status: Full  Pt Overview and Major Events to Date:  04/14/19 admitted, CT head, MR brain  Assessment and Plan: Brooke Collier is a 84 y.o. female presenting with LE weakness/numbess beginning at [facility] 04/14/19. 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 MR brain acute on chronic small vessel ischemia in the thalami, with 8 mm right lateral thalamic internal capsule lacune, with evidence of punctate contralateral left ventral thalamic infarct. Awaiting neurology recommendations today. Patient has 4/5 weakness on the left, both upper and lower extremities. BP yesterday at admission very elevated to 190s/130s. Most recently BP less than goal of <150/90 for a person her age. Neurology recommends starting ASA 325 mg, and continue atorvastatin, start Plavix. Hgb A1c 5.0, total cholesterol 192, HDL 71, LDL 109, triglycerides 62, overall lipids well controlled.  PT/OT recommending CIR, evaluation pending. - neuro following; appreciate recs -Hold home amlodipine, benicar -PT/OT - atorvastatin - ASA 325mg  Qd -Start Plavix  HTN Patient reports recent labile blood pressures at home. Is very compliant on her medications. Home meds include benicar 40mg  qhs, norvasc 2.5mg  po qam. States she takes both in AM. Per PCP telemedicine note from 02/17/19, last 3 encounter BP readings were 160/76, 154/68, 130/58.  At admission BPs very elevated to 190s over 130s.  Overnight blood pressure trended down, most recently 140s over 70s, less than goal of 150/90 at baseline. -Hold home meds  for permissive hypertension  Prolonged QTC EKG with QTc 524. Normal QTC in 2018.  - AM EKG - avoid QTC prolonging agents   Interstitial cystitis  Patient reports she follows with outpatient urology and takes an unknown pill. Was unable to determine the name. PCP notes list cysta q.  -Can restart meds at home - f/u as outpatient   Anxiety  Patient reports she has had increased anxiety recently secondary to coronavirus. Is avoiding going to her PCP in person secondary to fear of obtaining the virus. Home meds include Xanax tablet twice daily and was advised to start Lexapro.  Patient did not even know she had been prescribed Xanax, will hold it while in the hospital.  CIWA was 0 overnight. -Restart medications at home, hold in hospital  FEN/GI: regular diet Prophylaxis: lovenox   Disposition: to home or acute rehab, pending PT reevaluation  Subjective:  Patient feels better overall than yesterday, still some weakness on the left.   Objective: Temp:  [97.7 F (36.5 C)-99.1 F (37.3 C)] 98 F (36.7 C) (03/04 0437) Pulse Rate:  [57-93] 57 (03/04 0437) Resp:  [12-20] 18 (03/04 0437) BP: (118-197)/(56-138) 146/71 (03/04 0437) SpO2:  [97 %-100 %] 98 % (03/04 0437) Weight:  [42.6 kg-44.4 kg] 42.6 kg (03/03 1701) Physical Exam: General: Awake and alert x4, laying in bed, no acute distress, pleasant older woman Cardiovascular: RRR, no M/R/G Respiratory: Clear to auscultation bilaterally, no wheezes, rales or rhonchi Abdomen: Soft, NT, ND, normal bowel sounds present Extremities: Warm, dry, no edema Neuro: Cranial Nerves: II: PERRL.  III,IV, VI: EOMI without ptosis or diplopia.  V: Facial sensation is symmetric to touch VII: Facial movement is symmetric.  VIII: hearing is intact  to voice X: Palate elevates symmetrically XI: Shoulder shrug is symmetric. XII: tongue is midline without atrophy or fasciculations.  Motor: Tone is normal. Bulk is normal. 4/5 strength was present  in left UE and LE, 5/5 on RUE and RLE Sensory: Sensation is symmetric to light touch   Laboratory: Recent Labs  Lab 04/14/19 1052 04/14/19 1058 04/15/19 0337  WBC 4.2  --  4.5  HGB 14.4 13.9 12.2  HCT 43.9 41.0 36.4  PLT 193  --  200   Recent Labs  Lab 04/14/19 1052 04/14/19 1058 04/15/19 0337  NA 142 141 142  K 3.5 3.5 3.9  CL 103 102 105  CO2 27  --  26  BUN 16 19 20   CREATININE 0.90 0.70 0.75  CALCIUM 9.2  --  8.8*  PROT 6.5  --   --   BILITOT 1.0  --   --   ALKPHOS 44  --   --   ALT 27  --   --   AST 36  --   --   GLUCOSE 86 83 97    Imaging/Diagnostic Tests: MR BRAIN WO CONTRAST  Result Date: 04/14/2019 CLINICAL DATA:  84 year old female code stroke, left-sided weakness. EXAM: MRI HEAD WITHOUT CONTRAST TECHNIQUE: Multiplanar, multiecho pulse sequences of the brain and surrounding structures were obtained without intravenous contrast. COMPARISON:  Plain head CT earlier today. FINDINGS: Brain: There is a small 8 mm focus of restricted diffusion in the dorsal right deep gray nuclei, lateral thalamus near the posterior limb of the right internal capsule (series 5, image 69). No significant T2 or FLAIR hyperintensity at this time. Questionable punctate contralateral anterior left thalamic focus of restricted diffusion (series 7, image 54). No evidence of associated acute hemorrhage.  No mass effect. Underlying bilateral thalamic and basal ganglia T2 and FLAIR heterogeneity. Scattered chronic microhemorrhages, including in both occipital lobes, left perirolandic cortex on series 14, image 37. Confluent bilateral cerebral white matter T2 and FLAIR hyperintensity. No cortical encephalomalacia identified. No other restricted diffusion. No midline shift, mass effect, evidence of mass lesion, ventriculomegaly, extra-axial collection or acute intracranial hemorrhage. Cervicomedullary junction and pituitary are within normal limits. Mild T2 heterogeneity in the pons. Vascular: Major  intracranial vascular flow voids are preserved. The right vertebral artery is dominant. Skull and upper cervical spine: Negative for age visible cervical spine. Normal bone marrow signal. Sinuses/Orbits: Postoperative changes to the left globe. Paranasal sinuses and mastoids are stable and well pneumatized. Other: Grossly normal internal auditory structures. Scalp and face soft tissues appear negative. IMPRESSION: 1. Acute on chronic small vessel ischemia in the thalami: An 8 mm right lateral thalamic/internal capsule lacune is noted along with evidence of a punctate contralateral left ventral thalamic infarct. 2. No associated acute hemorrhage or mass effect. Occasional chronic micro hemorrhages in conjunction with underlying chronic small vessel disease. Electronically Signed   By: Genevie Ann M.D.   On: 04/14/2019 12:58   DG CHEST PORT 1 VIEW  Result Date: 04/14/2019 CLINICAL DATA:  Headache and left-sided numbness. EXAM: PORTABLE CHEST 1 VIEW COMPARISON:  Chest x-ray 05/04/2016 FINDINGS: The heart is within normal limits in size given the AP projection and portable technique. There is mild tortuosity and calcification of the thoracic aorta. Stable hyperinflation and emphysematous changes but no acute overlying pulmonary process or worrisome pulmonary lesions. No pleural effusion. The bony thorax is intact. IMPRESSION: Stable emphysematous changes and hyperinflation but no acute overlying pulmonary process. Electronically Signed   By: Ricky Stabs.D.  On: 04/14/2019 14:11   CT HEAD CODE STROKE WO CONTRAST  Result Date: 04/14/2019 CLINICAL DATA:  Code stroke. 84 year old female with left side weakness, last seen normal 0900 hours. EXAM: CT HEAD WITHOUT CONTRAST TECHNIQUE: Contiguous axial images were obtained from the base of the skull through the vertex without intravenous contrast. COMPARISON:  Head CT 05/04/2016. FINDINGS: Brain: No midline shift, mass effect, or evidence of intracranial mass lesion. No  ventriculomegaly. No acute intracranial hemorrhage identified. Confluent bilateral cerebral white matter hypodensity has not significantly changed since 2018. Posterior deep white matter capsule involvement as before. Lesser deep gray nuclei heterogeneity also appears stable. No superimposed acute cortically based infarct identified. Vascular: Calcified atherosclerosis at the skull base. No suspicious intracranial vascular hyperdensity. Skull: No acute osseous abnormality identified. Sinuses/Orbits: Visualized paranasal sinuses and mastoids are stable and well pneumatized. Other: Postoperative changes to the left globe since 2018. No acute orbit or scalp soft tissue findings. ASPECTS [facility] Stroke Program Early CT Score) Total score (0-10 with 10 being normal): 10 IMPRESSION: 1. No acute cortically based infarct or acute intracranial hemorrhage identified. ASPECTS 10. 2. Chronic small vessel disease appears stable by CT since 2018. 3. These results were communicated to Dr. Royal Hawthorn at 11:11 am on 04/14/2019 by text page via the [facility] messaging system. Electronically Signed   By: Genevie Ann M.D.   On: 04/14/2019 11:11   Gladys Damme, MD 04/15/2019, 8:22 AM PGY-1, [place] Intern pager: [phone], text pages welcome

## 2019-04-15 NOTE — Progress Notes (Signed)
  Echocardiogram 2D Echocardiogram has been performed.  Brooke Collier 04/15/2019, 10:09 AM

## 2019-04-15 NOTE — Progress Notes (Signed)
STROKE TEAM PROGRESS NOTE   INTERVAL HISTORY Patient is sitting up in bed.  She states she has noticed slight left-sided weakness since admission.  MRI scan of the brain was obtained which I personally reviewed the images shows small bilateral right greater than left thalamic infarcts.  CT angiogram of the brain and neck and echocardiogram pending.  Lipid profile and hemoglobin A1c have also been ordered.  Vitals:   04/14/19 1809 04/14/19 2007 04/14/19 2300 04/15/19 0437  BP: (!) 145/82 125/65 (!) 118/56 (!) 146/71  Pulse: 61 60  (!) 57  Resp: 16 18 18 18   Temp: 99 F (37.2 C) 97.7 F (36.5 C) 98.5 F (36.9 C) 98 F (36.7 C)  TempSrc: Oral Oral Oral Oral  SpO2: 99% 99% 97% 98%  Weight:      Height:        CBC:  Recent Labs  Lab 04/14/19 1052 04/14/19 1052 04/14/19 1058 04/15/19 0337  WBC 4.2  --   --  4.5  NEUTROABS 3.0  --   --   --   HGB 14.4   < > 13.9 12.2  HCT 43.9   < > 41.0 36.4  MCV 94.8  --   --  92.2  PLT 193  --   --  200   < > = values in this interval not displayed.    Basic Metabolic Panel:  Recent Labs  Lab 04/14/19 1052 04/14/19 1052 04/14/19 1058 04/15/19 0337  NA 142   < > 141 142  K 3.5   < > 3.5 3.9  CL 103   < > 102 105  CO2 27  --   --  26  GLUCOSE 86   < > 83 97  BUN 16   < > 19 20  CREATININE 0.90   < > 0.70 0.75  CALCIUM 9.2  --   --  8.8*   < > = values in this interval not displayed.   Lipid Panel:     Component Value Date/Time   CHOL 192 04/15/2019 0337   TRIG 62 04/15/2019 0337   HDL 71 04/15/2019 0337   CHOLHDL 2.7 04/15/2019 0337   VLDL 12 04/15/2019 0337   LDLCALC 109 (H) 04/15/2019 0337   HgbA1c:  Lab Results  Component Value Date   HGBA1C 5.0 04/15/2019   Urine Drug Screen:     Component Value Date/Time   LABOPIA NONE DETECTED 04/14/2019 1052   COCAINSCRNUR NONE DETECTED 04/14/2019 1052   LABBENZ NONE DETECTED 04/14/2019 1052   AMPHETMU NONE DETECTED 04/14/2019 1052   THCU NONE DETECTED 04/14/2019 1052    LABBARB NONE DETECTED 04/14/2019 1052    Alcohol Level     Component Value Date/Time   ETH <10 04/14/2019 2130    IMAGING past 24 hours MR BRAIN WO CONTRAST  Result Date: 04/14/2019 CLINICAL DATA:  84 year old female code stroke, left-sided weakness. EXAM: MRI HEAD WITHOUT CONTRAST TECHNIQUE: Multiplanar, multiecho pulse sequences of the brain and surrounding structures were obtained without intravenous contrast. COMPARISON:  Plain head CT earlier today. FINDINGS: Brain: There is a small 8 mm focus of restricted diffusion in the dorsal right deep gray nuclei, lateral thalamus near the posterior limb of the right internal capsule (series 5, image 69). No significant T2 or FLAIR hyperintensity at this time. Questionable punctate contralateral anterior left thalamic focus of restricted diffusion (series 7, image 54). No evidence of associated acute hemorrhage.  No mass effect. Underlying bilateral thalamic and basal ganglia T2 and  FLAIR heterogeneity. Scattered chronic microhemorrhages, including in both occipital lobes, left perirolandic cortex on series 14, image 37. Confluent bilateral cerebral white matter T2 and FLAIR hyperintensity. No cortical encephalomalacia identified. No other restricted diffusion. No midline shift, mass effect, evidence of mass lesion, ventriculomegaly, extra-axial collection or acute intracranial hemorrhage. Cervicomedullary junction and pituitary are within normal limits. Mild T2 heterogeneity in the pons. Vascular: Major intracranial vascular flow voids are preserved. The right vertebral artery is dominant. Skull and upper cervical spine: Negative for age visible cervical spine. Normal bone marrow signal. Sinuses/Orbits: Postoperative changes to the left globe. Paranasal sinuses and mastoids are stable and well pneumatized. Other: Grossly normal internal auditory structures. Scalp and face soft tissues appear negative. IMPRESSION: 1. Acute on chronic small vessel ischemia in  the thalami: An 8 mm right lateral thalamic/internal capsule lacune is noted along with evidence of a punctate contralateral left ventral thalamic infarct. 2. No associated acute hemorrhage or mass effect. Occasional chronic micro hemorrhages in conjunction with underlying chronic small vessel disease. Electronically Signed   By: Genevie Ann M.D.   On: 04/14/2019 12:58   DG CHEST PORT 1 VIEW  Result Date: 04/14/2019 CLINICAL DATA:  Headache and left-sided numbness. EXAM: PORTABLE CHEST 1 VIEW COMPARISON:  Chest x-ray 05/04/2016 FINDINGS: The heart is within normal limits in size given the AP projection and portable technique. There is mild tortuosity and calcification of the thoracic aorta. Stable hyperinflation and emphysematous changes but no acute overlying pulmonary process or worrisome pulmonary lesions. No pleural effusion. The bony thorax is intact. IMPRESSION: Stable emphysematous changes and hyperinflation but no acute overlying pulmonary process. Electronically Signed   By: Marijo Sanes M.D.   On: 04/14/2019 14:11   CT HEAD CODE STROKE WO CONTRAST  Result Date: 04/14/2019 CLINICAL DATA:  Code stroke. 84 year old female with left side weakness, last seen normal 0900 hours. EXAM: CT HEAD WITHOUT CONTRAST TECHNIQUE: Contiguous axial images were obtained from the base of the skull through the vertex without intravenous contrast. COMPARISON:  Head CT 05/04/2016. FINDINGS: Brain: No midline shift, mass effect, or evidence of intracranial mass lesion. No ventriculomegaly. No acute intracranial hemorrhage identified. Confluent bilateral cerebral white matter hypodensity has not significantly changed since 2018. Posterior deep white matter capsule involvement as before. Lesser deep gray nuclei heterogeneity also appears stable. No superimposed acute cortically based infarct identified. Vascular: Calcified atherosclerosis at the skull base. No suspicious intracranial vascular hyperdensity. Skull: No acute osseous  abnormality identified. Sinuses/Orbits: Visualized paranasal sinuses and mastoids are stable and well pneumatized. Other: Postoperative changes to the left globe since 2018. No acute orbit or scalp soft tissue findings. ASPECTS [facility] Stroke Program Early CT Score) Total score (0-10 with 10 being normal): 10 IMPRESSION: 1. No acute cortically based infarct or acute intracranial hemorrhage identified. ASPECTS 10. 2. Chronic small vessel disease appears stable by CT since 2018. 3. These results were communicated to Dr. Royal Hawthorn at 11:11 am on 04/14/2019 by text page via the [facility] messaging system. Electronically Signed   By: Genevie Ann M.D.   On: 04/14/2019 11:11    PHYSICAL EXAM Pleasant frail elderly Caucasian lady not in distress. . Afebrile. Head is nontraumatic. Neck is supple without bruit.    Cardiac exam no murmur or gallop. Lungs are clear to auscultation. Distal pulses are well felt. Neurological Exam ;  Awake  Alert oriented x 3. Normal speech and language.eye movements full without nystagmus.fundi were not visualized. Vision acuity and fields appear normal. Hearing is normal. Palatal  movements are normal. Face symmetric. Tongue midline. Normal strength, tone, reflexes and coordination except diminished fine finger movements on the left and orbits right over left upper extremity.  No upper or lower extremity drift but subtle weakness of left hip flexors on double simultaneous testing.  Mild left finger-to-nose dysmetria.. Normal sensation. Gait deferred.  ASSESSMENT/PLAN Brooke Collier is a 84 y.o. female with history of hypertension, anxiety and interstitial cystitis presenting with LE weakness, possibly worse on the L in setting of SBP > 200.    Stroke:  bilateral thalamic lacunar infarcts secondary to small vessel disease    Code Stroke CT head No acute abnormality. Small vessel disease. ASPECTS 10.     MRI  Acute on chronic thalamic small vessel disease. R thalamic lacune and  punctate L thalamic infarct.  Chronic micro hemorrhages and small vessel disease.   CTA head & neck no LVO. Generalized mild ectasia and atherosclerosis head and neck . L ICA siphon 50% calcified plaque.   2D Echo pending   LDL 109  HgbA1c 5.0  Lovenox 30 mg sq daily for VTE prophylaxis  aspirin 325 mg daily prior to admission, now on aspirin 325 mg daily. Given  mild stroke, recommend aspirin 81 mg and plavix 75 mg daily x 3 weeks, then PLAVIX alone. Orders adjusted.   Therapy recommendations:  CIR  Disposition:  pending   Hypertensive Urgency  SBP > 200 on arrival  BP now 140-160s  Permissive hypertension (OK if < 220/120) but gradually normalize in 5-7 days . Long-term BP goal normotensive  Hyperlipidemia  Home meds:  No statin  Now on lipitor 40  LDL 109, goal < 70  Continue statin at discharge  Other Stroke Risk Factors  Advanced age  Other Active Problems  Prolonged QTC  Interstitial cystitis   Anxiety   Leg cramps on tylenol, add prn quinine if does not resolve   Hospital day # 0  I have personally obtained history,examined this patient, reviewed notes, independently viewed imaging studies, participated in medical decision making and plan of care.ROS completed by me personally and pertinent positives fully documented  I have made any additions or clarifications directly to the above note.  She presented with nonspecific symptoms of bilateral leg heaviness and weakness and upon arrival did not have focal deficits but MRI does show small bilateral thalamic lacunar infarcts likely from small vessel disease.  Continue ongoing stroke work-up check echocardiogram and CT angiograms.  Recommend aspirin 81 and Plavix 75 mg daily for 3 weeks followed by aspirin alone.  Aggressive risk factor modification.  Greater than 50% time during this 25-minute visit was spent in counseling and coordination of care and discussion with care team  Antony Contras, MD Medical  Director [place] Pager: [phone] 04/15/2019 2:05 PM   To contact Stroke Continuity provider, please refer to [email or web address] After hours, contact General Neurology

## 2019-04-15 NOTE — Consult Note (Signed)
Physical Medicine and Rehabilitation Consult Reason for Consult: Bilateral lower extremity weakness Referring Physician: [facility]   HPI: Brooke Collier is a 84 y.o. right-handed female with history of hypertension, anxiety, chronic interstitial cystitis.  History taken from chart review and patient.  Patient lives alone independent prior to admission.  Two-level home with ramped entrance.  She presented on 05/11/2019 with lower extremity weakness.  Blood pressure 170/79.  Cranial CT unremarkable for acute intracranial process.  Patient did not receive TPA.  MRI showed acute on chronic small vessel ischemia in the thalami.  An 8 mm right lateral thalamic internal capsule lacune is noted along with evidence of punctate contralateral left ventral thalamic infarct.  CT angiogram of head and neck negative for large vessel occlusion.  Echocardiogram showed ejection fraction of 65% without emboli. Aspirin and Plavix for CVA prophylaxis.  Subcutaneous Lovenox for DVT prophylaxis.  Maintain on a regular consistency diet.  Therapy evaluations completed with recommendations of physical medicine rehab consult.  Review of Systems  Constitutional: Negative for chills and fever.  HENT: Negative for hearing loss.   Eyes: Negative for blurred vision and double vision.  Respiratory: Negative for cough and shortness of breath.   Cardiovascular: Negative for chest pain, palpitations and leg swelling.  Gastrointestinal: Positive for constipation. Negative for heartburn, nausea and vomiting.  Genitourinary: Negative for dysuria, flank pain and hematuria.  Musculoskeletal: Positive for myalgias.  Skin: Negative for rash.  Neurological: Positive for sensory change. Negative for weakness.  Psychiatric/Behavioral: The patient has insomnia.        Anxiety   Past Medical History:  Diagnosis Date  . Allergy   . Chronic interstitial cystitis   . Hypertension    Past Surgical History:  Procedure Laterality  Date  . ABDOMINAL HYSTERECTOMY    . INTRAMEDULLARY (IM) NAIL INTERTROCHANTERIC  05/04/2016  . INTRAMEDULLARY (IM) NAIL INTERTROCHANTERIC Left 05/04/2016   Procedure: INTRAMEDULLARY (IM) NAIL INTERTROCHANTRIC;  Surgeon: Mcarthur Rossetti, MD;  Location: [facility];  Service: Orthopedics;  Laterality: Left;  . tonsillectomyy     Family History  Problem Relation Age of Onset  . Heart disease Mother   . Heart disease Father   . Anxiety disorder Sister   . Heart disease Brother   . Anxiety disorder Brother    Social History:  reports that she has never smoked. She has never used smokeless tobacco. She reports that she does not drink alcohol or use drugs. Allergies:  Allergies  Allergen Reactions  . Ciprofloxacin     Muscle weakness  . Zoloft [Sertraline] Nausea And Vomiting   Medications Prior to Admission  Medication Sig Dispense Refill  . acetaminophen (TYLENOL) 325 MG tablet Take 2 tablets (650 mg total) by mouth every 6 (six) hours as needed (Pain, Hadache or Fever >/= 101).    [name] ALPRAZolam (XANAX) 0.25 MG tablet Take 0.125 mg by mouth 2 (two) times daily.    [name] amLODipine (NORVASC) 2.5 MG tablet Take 2.5 mg by mouth daily.    [name] aspirin EC 325 MG EC tablet Take 1 tablet (325 mg total) by mouth daily with breakfast. (Patient taking differently: Take 81 mg by mouth daily with breakfast. ) 30 tablet 0  . Calcium Carb-Cholecalciferol (CALCIUM-VITAMIN D) 500-200 MG-UNIT tablet Take 1 tablet by mouth daily.    . carboxymethylcellulose (REFRESH PLUS) 0.5 % SOLN Place 1 drop into both eyes daily as needed (dry eyes).    [name] Co-Enzyme Q-10 30 MG CAPS Take 30 mg  by mouth daily.    . Magnesium 100 MG TABS Take 100 mg by mouth daily.    . Misc Natural Products (CYSTEX URINARY HEALTH PO) Take 1 tablet by mouth in the morning and at bedtime.    [name] olmesartan (BENICAR) 40 MG tablet Take 20 mg by mouth 2 (two) times daily.     . Calcium Carbonate (CALCIUM 500 PO) Take by mouth daily.      . iron  polysaccharides (NIFEREX) 150 MG capsule Take 1 capsule (150 mg total) by mouth daily. (Patient not taking: Reported on 04/14/2019)      Home: Home Living Family/patient expects to be discharged to:: Private residence Living Arrangements: Alone Available Help at Discharge: Family, Available PRN/intermittently Type of Home: House Home Access: [place]: Two level, Able to live on main level with bedroom/bathroom Bathroom Shower/Tub: Multimedia programmer: Handicapped height [place]: Environmental consultant - 2 wheels, [place] - single point, Civil engineer, contracting  Lives With: Alone  Functional History: Prior Function Level of Independence: Independent Functional Status:  Mobility: Bed Mobility Overal bed mobility: Needs Assistance Bed Mobility: Supine to Sit, Sit to Supine Supine to sit: Min assist Sit to supine: Min assist General bed mobility comments: Min A for trunk and LE assist.  Transfers Overall transfer level: Needs assistance Equipment used: Rolling walker (2 wheeled) Transfers: Sit to/from Stand Sit to Stand: Mod assist General transfer comment: Mod A to static stand from EOB with RW,  left side weakness noted. Sit to stand x 3. Pt stood ~60 seconds each trial. Pt reports dizziness in sitting and standing, see orthostatic vitals. Ambulation/Gait Ambulation/Gait assistance: +2 physical assistance, +2 safety/equipment, Mod assist Gait Distance (Feet): 3 Feet Assistive device: Rolling walker (2 wheeled) Gait Pattern/deviations: Step-to pattern, Narrow base of support General Gait Details: 3 side steps to L at edge of bed with RW and blocking of L knee 2* buckling; also performed marching in standing with RW, incoordination of LLE noted but pt able to clear L foot from floor, pt reports B feet feel numb Gait velocity: decr    ADL: ADL Overall ADL's : Needs assistance/impaired Eating/Feeding: Set up, Sitting, Minimal assistance Eating/Feeding Details (indicate cue  type and reason): intermittent steadying assist to seat in unsupported sitting position (EOB) Grooming: Minimal assistance, Sitting Upper Body Bathing: Minimal assistance, Sitting Lower Body Bathing: Moderate assistance, Sit to/from stand, +2 for safety/equipment Upper Body Dressing : Minimal assistance, Sitting Lower Body Dressing: Moderate assistance, +2 for safety/equipment, Sit to/from stand Toilet Transfer: Moderate assistance, Stand-pivot, Maximal assistance, BSC, RW Toileting- Clothing Manipulation and Hygiene: Sit to/from stand, Moderate assistance, +2 for safety/equipment General ADL Comments: Pt completed bed mobility, 2x sit<>stands, side stepped along EOB, and walked in place. 2 seated resting breaks incorporated. + lightheaded/swimmyheaded with positional chane but not objectively found to be orthostatic  Cognition: Cognition Overall Cognitive Status: Within Functional Limits for tasks assessed Orientation Level: Oriented X4 Cognition Arousal/Alertness: Awake/alert Behavior During Therapy: WFL for tasks assessed/performed Overall Cognitive Status: Within Functional Limits for tasks assessed  Blood pressure 134/72, pulse (!) 55, temperature 97.8 F (36.6 C), temperature source Oral, resp. rate 18, height 5\' 4"  (1.626 m), weight 42.6 kg, SpO2 98 %. Physical Exam  Vitals reviewed. Constitutional: She appears well-developed.  Frail  HENT:  Head: Normocephalic and atraumatic.  Eyes: EOM are normal. Right eye exhibits no discharge. Left eye exhibits no discharge.  Neck: No tracheal deviation present. No thyromegaly present.  Respiratory: Effort normal. No respiratory distress.  GI: Soft. She exhibits no distension.  Musculoskeletal:     Comments: No edema or tenderness in extremities  Neurological: She is alert.  Patient is alert and oriented x3.   Follows commands. Motor: 4+/5 throughout Left upper extremity ataxia. Sensation diminished to light touch left plantar foot    Skin: Skin is warm and dry.  Psychiatric: She has a normal mood and affect. Her behavior is normal.    Results for orders placed or performed during the hospital encounter of 04/14/19 (from the past 24 hour(s))  CBC     Status: None   Collection Time: 04/16/19  6:02 AM  Result Value Ref Range   WBC 4.3 4.0 - 10.5 K/uL   RBC 4.06 3.87 - 5.11 MIL/uL   Hemoglobin 12.6 12.0 - 15.0 g/dL   HCT 37.2 36.0 - 46.0 %   MCV 91.6 80.0 - 100.0 fL   MCH 31.0 26.0 - 34.0 pg   MCHC 33.9 30.0 - 36.0 g/dL   RDW 13.6 11.5 - 15.5 %   Platelets 198 150 - 400 K/uL   nRBC 0.0 0.0 - 0.2 %  Basic metabolic panel     Status: Abnormal   Collection Time: 04/16/19  6:02 AM  Result Value Ref Range   Sodium 141 135 - 145 mmol/L   Potassium 3.8 3.5 - 5.1 mmol/L   Chloride 108 98 - 111 mmol/L   CO2 26 22 - 32 mmol/L   Glucose, Bld 91 70 - 99 mg/dL   BUN 16 8 - 23 mg/dL   Creatinine, Ser 0.91 0.44 - 1.00 mg/dL   Calcium 8.7 (L) 8.9 - 10.3 mg/dL   GFR calc non Af Amer 59 (L) >60 mL/min   GFR calc Af Amer >60 >60 mL/min   Anion gap 7 5 - 15   CT ANGIO HEAD W OR WO CONTRAST  Result Date: 04/15/2019 CLINICAL DATA:  84 year old female code stroke presentation yesterday found to have small right greater than left acute on chronic thalamic lacunar infarcts on MRI. EXAM: CT ANGIOGRAPHY HEAD AND NECK TECHNIQUE: Multidetector CT imaging of the head and neck was performed using the standard protocol during bolus administration of intravenous contrast. Multiplanar CT image reconstructions and MIPs were obtained to evaluate the vascular anatomy. Carotid stenosis measurements (when applicable) are obtained utilizing NASCET criteria, using the distal internal carotid diameter as the denominator. CONTRAST:  11mL OMNIPAQUE IOHEXOL 350 MG/ML SOLN COMPARISON:  Plain head CT and brain MRI yesterday. FINDINGS: CT HEAD Brain: No acute intracranial hemorrhage identified. No midline shift, mass effect, or evidence of intracranial mass  lesion. No ventriculomegaly. Confluent bilateral cerebral white matter hypodensity as well as heterogeneity in both thalami. Increased hypodensity at the right lateral thalamus since yesterday. No superimposed acute cortically based infarct identified. Calvarium and skull base: Osteopenia. No acute osseous abnormality identified. Paranasal sinuses: Visualized paranasal sinuses and mastoids are stable and well pneumatized. Orbits: No acute orbit or scalp soft tissue finding. CTA NECK Skeleton: Cervical spine degeneration. Osteopenia. No acute osseous abnormality identified. Upper chest: Negative. Other neck: Negative. Aortic arch: The entire arch is not included. No aortic atherosclerosis is evident. Right carotid system: Distal brachiocephalic artery is normal. Normal right CCA origin. Minimal calcified plaque at the right carotid bifurcation mostly affects the ECA. Tortuous right ICA without stenosis. Left carotid system: Visible proximal left CCA is normal. Calcified plaque at the left ICA origin does not result in stenosis. Negative left ICA otherwise to the skull base. Vertebral arteries: Calcified  plaque in the proximal right subclavian artery without significant stenosis. Normal right vertebral artery origin. Dominant and mildly ectatic right vertebral artery is widely patent to the skull base. Visible proximal left subclavian artery and left vertebral artery origin are normal. Non dominant left vertebral artery is patent to the skull base without plaque or stenosis. CTA HEAD Posterior circulation: There is mild calcified plaque in the dominant right V4 segment without stenosis. The left vertebral functionally terminates in PICA. The right AICA appears dominant. Patent basilar artery without stenosis. Normal SCA and right PCA origins. Fetal type left PCA origin. The right posterior communicating artery is diminutive or absent. There is mild irregularity of the bilateral P1 and P2 segments. Otherwise PCA  branches are within normal limits. Anterior circulation: Both ICA siphons are patent. On the left there is supraclinoid calcified plaque resulting in about 50% stenosis (series 12, image 94). Normal left posterior communicating artery origin. On the right there is cavernous and supraclinoid calcified plaque with only mild stenosis. Patent carotid termini. Normal MCA and ACA origins. Tortuous A1 segments. Anterior communicating artery and bilateral ACA branches are within normal limits. Left MCA M1 segment and bifurcation are patent without stenosis. Left MCA branches are within normal limits. Right MCA M1 segment and bifurcation are patent without stenosis. Right MCA branches are normal aside from mild posterior M2 branch irregularity on series 16, image 13. Venous sinuses: Early contrast timing, not well evaluated. Anatomic variants: Dominant right vertebral artery, the left functionally terminates in PICA. Fetal type left PCA origin. Review of the MIP images confirms the above findings IMPRESSION: 1. Negative for large vessel occlusion. 2. Generalized arterial ectasia with mild for age atherosclerosis in the head and neck. Calcified plaque results in 50% stenosis of the supraclinoid Left ICA siphon. 3. Expected CT appearance of the right thalamic lacunar infarct. No mass effect, hemorrhage, or new intracranial abnormality. Electronically Signed   By: Genevie Ann M.D.   On: 04/15/2019 12:52   CT ANGIO NECK W OR WO CONTRAST  Result Date: 04/15/2019 CLINICAL DATA:  84 year old female code stroke presentation yesterday found to have small right greater than left acute on chronic thalamic lacunar infarcts on MRI. EXAM: CT ANGIOGRAPHY HEAD AND NECK TECHNIQUE: Multidetector CT imaging of the head and neck was performed using the standard protocol during bolus administration of intravenous contrast. Multiplanar CT image reconstructions and MIPs were obtained to evaluate the vascular anatomy. Carotid stenosis measurements  (when applicable) are obtained utilizing NASCET criteria, using the distal internal carotid diameter as the denominator. CONTRAST:  167mL OMNIPAQUE IOHEXOL 350 MG/ML SOLN COMPARISON:  Plain head CT and brain MRI yesterday. FINDINGS: CT HEAD Brain: No acute intracranial hemorrhage identified. No midline shift, mass effect, or evidence of intracranial mass lesion. No ventriculomegaly. Confluent bilateral cerebral white matter hypodensity as well as heterogeneity in both thalami. Increased hypodensity at the right lateral thalamus since yesterday. No superimposed acute cortically based infarct identified. Calvarium and skull base: Osteopenia. No acute osseous abnormality identified. Paranasal sinuses: Visualized paranasal sinuses and mastoids are stable and well pneumatized. Orbits: No acute orbit or scalp soft tissue finding. CTA NECK Skeleton: Cervical spine degeneration. Osteopenia. No acute osseous abnormality identified. Upper chest: Negative. Other neck: Negative. Aortic arch: The entire arch is not included. No aortic atherosclerosis is evident. Right carotid system: Distal brachiocephalic artery is normal. Normal right CCA origin. Minimal calcified plaque at the right carotid bifurcation mostly affects the ECA. Tortuous right ICA without stenosis. Left carotid system: Visible proximal  left CCA is normal. Calcified plaque at the left ICA origin does not result in stenosis. Negative left ICA otherwise to the skull base. Vertebral arteries: Calcified plaque in the proximal right subclavian artery without significant stenosis. Normal right vertebral artery origin. Dominant and mildly ectatic right vertebral artery is widely patent to the skull base. Visible proximal left subclavian artery and left vertebral artery origin are normal. Non dominant left vertebral artery is patent to the skull base without plaque or stenosis. CTA HEAD Posterior circulation: There is mild calcified plaque in the dominant right V4  segment without stenosis. The left vertebral functionally terminates in PICA. The right AICA appears dominant. Patent basilar artery without stenosis. Normal SCA and right PCA origins. Fetal type left PCA origin. The right posterior communicating artery is diminutive or absent. There is mild irregularity of the bilateral P1 and P2 segments. Otherwise PCA branches are within normal limits. Anterior circulation: Both ICA siphons are patent. On the left there is supraclinoid calcified plaque resulting in about 50% stenosis (series 12, image 94). Normal left posterior communicating artery origin. On the right there is cavernous and supraclinoid calcified plaque with only mild stenosis. Patent carotid termini. Normal MCA and ACA origins. Tortuous A1 segments. Anterior communicating artery and bilateral ACA branches are within normal limits. Left MCA M1 segment and bifurcation are patent without stenosis. Left MCA branches are within normal limits. Right MCA M1 segment and bifurcation are patent without stenosis. Right MCA branches are normal aside from mild posterior M2 branch irregularity on series 16, image 13. Venous sinuses: Early contrast timing, not well evaluated. Anatomic variants: Dominant right vertebral artery, the left functionally terminates in PICA. Fetal type left PCA origin. Review of the MIP images confirms the above findings IMPRESSION: 1. Negative for large vessel occlusion. 2. Generalized arterial ectasia with mild for age atherosclerosis in the head and neck. Calcified plaque results in 50% stenosis of the supraclinoid Left ICA siphon. 3. Expected CT appearance of the right thalamic lacunar infarct. No mass effect, hemorrhage, or new intracranial abnormality. Electronically Signed   By: Genevie Ann M.D.   On: 04/15/2019 12:52   MR BRAIN WO CONTRAST  Result Date: 04/14/2019 CLINICAL DATA:  84 year old female code stroke, left-sided weakness. EXAM: MRI HEAD WITHOUT CONTRAST TECHNIQUE: Multiplanar,  multiecho pulse sequences of the brain and surrounding structures were obtained without intravenous contrast. COMPARISON:  Plain head CT earlier today. FINDINGS: Brain: There is a small 8 mm focus of restricted diffusion in the dorsal right deep gray nuclei, lateral thalamus near the posterior limb of the right internal capsule (series 5, image 69). No significant T2 or FLAIR hyperintensity at this time. Questionable punctate contralateral anterior left thalamic focus of restricted diffusion (series 7, image 54). No evidence of associated acute hemorrhage.  No mass effect. Underlying bilateral thalamic and basal ganglia T2 and FLAIR heterogeneity. Scattered chronic microhemorrhages, including in both occipital lobes, left perirolandic cortex on series 14, image 37. Confluent bilateral cerebral white matter T2 and FLAIR hyperintensity. No cortical encephalomalacia identified. No other restricted diffusion. No midline shift, mass effect, evidence of mass lesion, ventriculomegaly, extra-axial collection or acute intracranial hemorrhage. Cervicomedullary junction and pituitary are within normal limits. Mild T2 heterogeneity in the pons. Vascular: Major intracranial vascular flow voids are preserved. The right vertebral artery is dominant. Skull and upper cervical spine: Negative for age visible cervical spine. Normal bone marrow signal. Sinuses/Orbits: Postoperative changes to the left globe. Paranasal sinuses and mastoids are stable and well pneumatized. Other: Grossly  normal internal auditory structures. Scalp and face soft tissues appear negative. IMPRESSION: 1. Acute on chronic small vessel ischemia in the thalami: An 8 mm right lateral thalamic/internal capsule lacune is noted along with evidence of a punctate contralateral left ventral thalamic infarct. 2. No associated acute hemorrhage or mass effect. Occasional chronic micro hemorrhages in conjunction with underlying chronic small vessel disease. Electronically  Signed   By: Genevie Ann M.D.   On: 04/14/2019 12:58   DG CHEST PORT 1 VIEW  Result Date: 04/14/2019 CLINICAL DATA:  Headache and left-sided numbness. EXAM: PORTABLE CHEST 1 VIEW COMPARISON:  Chest x-ray 05/04/2016 FINDINGS: The heart is within normal limits in size given the AP projection and portable technique. There is mild tortuosity and calcification of the thoracic aorta. Stable hyperinflation and emphysematous changes but no acute overlying pulmonary process or worrisome pulmonary lesions. No pleural effusion. The bony thorax is intact. IMPRESSION: Stable emphysematous changes and hyperinflation but no acute overlying pulmonary process. Electronically Signed   By: Marijo Sanes M.D.   On: 04/14/2019 14:11   ECHOCARDIOGRAM COMPLETE  Result Date: 04/15/2019    ECHOCARDIOGRAM REPORT   Patient Name:   Brooke Collier Date of Exam: 04/15/2019 Medical Rec #:  [id number]           Height:       64.0 in Accession #:    [id number]          Weight:       93.9 lb Date of Birth:  06-22-1935            BSA:          1.419 m Patient Age:    16 years            BP:           146/71 mmHg Patient Gender: F                   HR:           74 bpm. Exam Location:  Inpatient Procedure: 2D Echo, Cardiac Doppler and Color Doppler Indications:    Stroke  History:        Patient has no prior history of Echocardiogram examinations.                 Abnormal ECG, Stroke; Risk Factors:Dyslipidemia and                 Hypertension.  Sonographer:    Roseanna Rainbow RDCS Referring Phys: [place]  1. Left ventricular ejection fraction, by estimation, is 60 to 65%. The left ventricle has normal function. The left ventricle has no regional wall motion abnormalities. Left ventricular diastolic parameters are consistent with Grade I diastolic dysfunction (impaired relaxation).  2. Right ventricular systolic function is normal. The right ventricular size is normal. There is normal pulmonary artery systolic pressure. The  estimated right ventricular systolic pressure is [id number] mmHg.  3. The mitral valve is normal in structure and function. No evidence of mitral valve regurgitation. No evidence of mitral stenosis.  4. The aortic valve is normal in structure and function. Aortic valve regurgitation is mild. Mild aortic valve sclerosis is present, with no evidence of aortic valve stenosis.  5. Severe atherosclerotic plaque is seen in the abdominal aorta. The aortic arch could not be well visualized.  6. The inferior vena cava is normal in size with greater than 50% respiratory variability, suggesting right atrial pressure of 3 mmHg.  FINDINGS  Left Ventricle: Left ventricular ejection fraction, by estimation, is 60 to 65%. The left ventricle has normal function. The left ventricle has no regional wall motion abnormalities. The left ventricular internal cavity size was normal in size. There is  no left ventricular hypertrophy. Left ventricular diastolic parameters are consistent with Grade I diastolic dysfunction (impaired relaxation). Normal left ventricular filling pressure. Right Ventricle: The right ventricular size is normal. No increase in right ventricular wall thickness. Right ventricular systolic function is normal. There is normal pulmonary artery systolic pressure. The tricuspid regurgitant velocity is 2.40 m/s, and  with an assumed right atrial pressure of 3 mmHg, the estimated right ventricular systolic pressure is [id number] mmHg. Left Atrium: Left atrial size was normal in size. Right Atrium: Right atrial size was normal in size. Pericardium: There is no evidence of pericardial effusion. Mitral Valve: The mitral valve is normal in structure and function. Normal mobility of the mitral valve leaflets. No evidence of mitral valve regurgitation. No evidence of mitral valve stenosis. Tricuspid Valve: The tricuspid valve is normal in structure. Tricuspid valve regurgitation is mild . No evidence of tricuspid stenosis. Aortic Valve: The  aortic valve is normal in structure and function. Aortic valve regurgitation is mild. Aortic regurgitation PHT measures 530 msec. Mild aortic valve sclerosis is present, with no evidence of aortic valve stenosis. Pulmonic Valve: The pulmonic valve was normal in structure. Pulmonic valve regurgitation is not visualized. No evidence of pulmonic stenosis. Aorta: Severe atherosclerotic plaque is seen in the abdominal aorta. The aortic arch could not be well visualized. The aortic root is normal in size and structure. Venous: The inferior vena cava is normal in size with greater than 50% respiratory variability, suggesting right atrial pressure of 3 mmHg. IAS/Shunts: No atrial level shunt detected by color flow Doppler.  LEFT VENTRICLE PLAX 2D LVIDd:         3.70 cm     Diastology LVIDs:         2.50 cm     LV e' lateral:   9.79 cm/s LV PW:         1.10 cm     LV E/e' lateral: 7.1 LV IVS:        1.40 cm     LV e' medial:    5.87 cm/s LVOT diam:     1.90 cm     LV E/e' medial:  11.9 LV SV:         56 LV SV Index:   40 LVOT Area:     2.84 cm  LV Volumes (MOD) LV vol d, MOD A2C: 38.9 ml LV vol d, MOD A4C: 57.0 ml LV vol s, MOD A2C: 17.2 ml LV vol s, MOD A4C: 26.7 ml LV SV MOD A2C:     21.7 ml LV SV MOD A4C:     57.0 ml LV SV MOD BP:      27.4 ml RIGHT VENTRICLE             IVC RV S prime:     15.90 cm/s  IVC diam: 1.40 cm TAPSE (M-mode): 1.4 cm LEFT ATRIUM             Index       RIGHT ATRIUM           Index LA diam:        2.40 cm 1.69 cm/m  RA Area:     11.20 cm LA Vol (A2C):   40.0 ml 28.19 ml/m RA  Volume:   26.90 ml  18.96 ml/m LA Vol (A4C):   36.8 ml 25.94 ml/m LA Biplane Vol: 42.2 ml 29.74 ml/m  AORTIC VALVE LVOT Vmax:   106.00 cm/s LVOT Vmean:  71.800 cm/s LVOT VTI:    0.199 m AI PHT:      530 msec  AORTA Ao Root diam: 2.90 cm Ao Asc diam:  3.10 cm MITRAL VALVE               TRICUSPID VALVE MV Area (PHT): 2.39 cm    TR Peak grad:   23.0 mmHg MV Decel Time: 317 msec    TR Vmax:        240.00 cm/s MV E  velocity: 69.80 cm/s MV A velocity: 89.20 cm/s  SHUNTS MV E/A ratio:  0.78        Systemic VTI:  0.20 m                            Systemic Diam: 1.90 cm Dani Gobble Croitoru MD Electronically signed by Sanda Klein MD Signature Date/Time: 04/15/2019/1:24:50 PM    Final     Assessment/Plan: Diagnosis: Bilateral thalamic infarcts Stroke: Continue secondary stroke prophylaxis and Risk Factor Modification listed below:   Antiplatelet therapy:   Blood Pressure Management:  Continue current medication with prn's with permisive HTN per primary team Statin Agent:   Labs independently reviewed.  Records reviewed and summated above.  1. Does the need for close, 24 hr/day medical supervision in concert with the patient's rehab needs make it unreasonable for this patient to be served in a less intensive setting? Yes 2. Co-Morbidities requiring supervision/potential complications: HTN (monitor and provide prns in accordance with increased physical exertion and pain), anxiety (ensure anxiety and resulting apprehension do not limit functional progress; consider prn medications if warranted), chronic interstitial cystitis 3. Due to safety, disease management and patient education, does the patient require 24 hr/day rehab nursing? Yes 4. Does the patient require coordinated care of a physician, rehab nurse, therapy disciplines of PT/OT to address physical and functional deficits in the context of the above medical diagnosis(es)? Yes Addressing deficits in the following areas: balance, endurance, locomotion, transferring, bathing, dressing, toileting and psychosocial support 5. Can the patient actively participate in an intensive therapy program of at least 3 hrs of therapy per day at least 5 days per week? Yes 6. The potential for patient to make measurable gains while on inpatient rehab is excellent 7. Anticipated functional outcomes upon discharge from inpatient rehab are supervision and min assist  with PT,  supervision and min assist with OT, n/a with SLP. 8. Estimated rehab length of stay to reach the above functional goals is: 12-16 days. 9. Anticipated discharge destination: Home 10. Overall Rehab/Functional Prognosis: good  RECOMMENDATIONS: This patient's condition is appropriate for continued rehabilitative care in the following setting: CIR if caregiver support available upon discharge. Patient has agreed to participate in recommended program. Yes Note that insurance prior authorization may be required for reimbursement for recommended care.  Comment: Rehab Admissions Coordinator to follow up.  I have personally performed a face to face diagnostic evaluation, including, but not limited to relevant history and physical exam findings, of this patient and developed relevant assessment and plan.  Additionally, I have reviewed and concur with the physician assistant's documentation above.   Delice Lesch, MD, ABPMR Lavon Paganini Angiulli, PA-C 04/16/2019

## 2019-04-16 DIAGNOSIS — F411 Generalized anxiety disorder: Secondary | ICD-10-CM

## 2019-04-16 DIAGNOSIS — I10 Essential (primary) hypertension: Secondary | ICD-10-CM

## 2019-04-16 DIAGNOSIS — N3020 Other chronic cystitis without hematuria: Secondary | ICD-10-CM

## 2019-04-16 LAB — CBC
HCT: 37.2 % (ref 36.0–46.0)
Hemoglobin: 12.6 g/dL (ref 12.0–15.0)
MCH: 31 pg (ref 26.0–34.0)
MCHC: 33.9 g/dL (ref 30.0–36.0)
MCV: 91.6 fL (ref 80.0–100.0)
Platelets: 198 10*3/uL (ref 150–400)
RBC: 4.06 MIL/uL (ref 3.87–5.11)
RDW: 13.6 % (ref 11.5–15.5)
WBC: 4.3 10*3/uL (ref 4.0–10.5)
nRBC: 0 % (ref 0.0–0.2)

## 2019-04-16 LAB — BASIC METABOLIC PANEL
Anion gap: 7 (ref 5–15)
BUN: 16 mg/dL (ref 8–23)
CO2: 26 mmol/L (ref 22–32)
Calcium: 8.7 mg/dL — ABNORMAL LOW (ref 8.9–10.3)
Chloride: 108 mmol/L (ref 98–111)
Creatinine, Ser: 0.91 mg/dL (ref 0.44–1.00)
GFR calc Af Amer: >60 mL/min (ref >60)
GFR calc non Af Amer: 59 mL/min — ABNORMAL LOW (ref >60)
Glucose, Bld: 91 mg/dL (ref 70–99)
Potassium: 3.8 mmol/L (ref 3.5–5.1)
Sodium: 141 mmol/L (ref 135–145)

## 2019-04-16 MED ORDER — POLYVINYL ALCOHOL 1.4 % OP SOLN
1.0000 [drp] | OPHTHALMIC | Status: DC | PRN
  Filled 2019-04-16: qty 15

## 2019-04-16 MED ORDER — SIMETHICONE 80 MG PO CHEW
80.0000 mg | CHEWABLE_TABLET | Freq: Once | ORAL | Status: DC
  Filled 2019-04-16: qty 1

## 2019-04-16 MED ORDER — BUSPIRONE HCL 5 MG PO TABS
5.0000 mg | ORAL_TABLET | Freq: Two times a day (BID) | ORAL | Status: DC
  Administered 2019-04-16 – 2019-04-19 (×7): 5 mg via ORAL
  Filled 2019-04-16 (×8): qty 1

## 2019-04-16 MED ORDER — ALUM & MAG HYDROXIDE-SIMETH 200-200-20 MG/5ML PO SUSP: 30.0000 mL | ORAL | Status: DC | PRN

## 2019-04-16 MED ORDER — ALPRAZOLAM 0.25 MG PO TABS
0.1250 mg | ORAL_TABLET | Freq: Every evening | ORAL | Status: DC | PRN
  Administered 2019-04-17 – 2019-04-18 (×2): 0.125 mg via ORAL
  Filled 2019-04-16 (×2): qty 1

## 2019-04-16 NOTE — Progress Notes (Signed)
STROKE TEAM PROGRESS NOTE   INTERVAL HISTORY Patient is sitting up at side of her bed.  She states she slightly improved but not back to baseline.  Therapist is working with her and recommend inpatient rehab.  CT angiogram of brain and neck showed generalized ectasia with mild atherosclerotic changes with 50% left supraclinoid ICA stenosis only.  Transthoracic echo is unremarkable. Vitals:   04/16/19 0020 04/16/19 0535 04/16/19 0538 04/16/19 1152  BP: (!) 150/72 134/72 134/72 (!) 150/78  Pulse: (!) 51 (!) 55 (!) 55 66  Resp: 16 18 18 18   Temp: 98 F (36.7 C) 97.8 F (36.6 C) 97.8 F (36.6 C) 98.1 F (36.7 C)  TempSrc: Oral Oral Oral Oral  SpO2: 98%  97% 98%  Weight:      Height:        CBC:  Recent Labs  Lab 04/14/19 1052 04/14/19 1058 04/15/19 0337 04/16/19 0602  WBC 4.2   < > 4.5 4.3  NEUTROABS 3.0  --   --   --   HGB 14.4   < > 12.2 12.6  HCT 43.9   < > 36.4 37.2  MCV 94.8   < > 92.2 91.6  PLT 193   < > 200 198   < > = values in this interval not displayed.    Basic Metabolic Panel:  Recent Labs  Lab 04/15/19 0337 04/16/19 0602  NA 142 141  K 3.9 3.8  CL 105 108  CO2 26 26  GLUCOSE 97 91  BUN 20 16  CREATININE 0.75 0.91  CALCIUM 8.8* 8.7*   Lipid Panel:     Component Value Date/Time   CHOL 192 04/15/2019 0337   TRIG 62 04/15/2019 0337   HDL 71 04/15/2019 0337   CHOLHDL 2.7 04/15/2019 0337   VLDL 12 04/15/2019 0337   LDLCALC 109 (H) 04/15/2019 0337   HgbA1c:  Lab Results  Component Value Date   HGBA1C 5.0 04/15/2019   Urine Drug Screen:     Component Value Date/Time   LABOPIA NONE DETECTED 04/14/2019 1052   COCAINSCRNUR NONE DETECTED 04/14/2019 1052   LABBENZ NONE DETECTED 04/14/2019 1052   AMPHETMU NONE DETECTED 04/14/2019 1052   THCU NONE DETECTED 04/14/2019 1052   LABBARB NONE DETECTED 04/14/2019 1052    Alcohol Level     Component Value Date/Time   ETH <10 04/14/2019 2130    IMAGING past 24 hours No results found.  PHYSICAL  EXAM Pleasant frail elderly Caucasian lady not in distress. . Afebrile. Head is nontraumatic. Neck is supple without bruit.    Cardiac exam no murmur or gallop. Lungs are clear to auscultation. Distal pulses are well felt. Neurological Exam ;  Awake  Alert oriented x 3. Normal speech and language.eye movements full without nystagmus.fundi were not visualized. Vision acuity and fields appear normal. Hearing is normal. Palatal movements are normal. Face symmetric. Tongue midline. Normal strength, tone, reflexes and coordination except diminished fine finger movements on the left and orbits right over left upper extremity.  No upper or lower extremity drift but subtle weakness of left hip flexors on double simultaneous testing.  Mild left finger-to-nose dysmetria.. Normal sensation. Gait deferred.  ASSESSMENT/PLAN Ms. JESSICAMARIE TWEET is a 84 y.o. female with history of hypertension, anxiety and interstitial cystitis presenting with LE weakness, possibly worse on the L in setting of SBP > 200.    Stroke:  bilateral thalamic lacunar infarcts secondary to small vessel disease    Code Stroke CT head No  acute abnormality. Small vessel disease. ASPECTS 10.     MRI  Acute on chronic thalamic small vessel disease. R thalamic lacune and punctate L thalamic infarct.  Chronic micro hemorrhages and small vessel disease.   CTA head & neck no LVO. Generalized mild ectasia and atherosclerosis head and neck . L ICA siphon 50% calcified plaque.   2D Echo pending   LDL 109  HgbA1c 5.0  Lovenox 30 mg sq daily for VTE prophylaxis  aspirin 325 mg daily prior to admission, now on aspirin 325 mg daily. Given  mild stroke, recommend aspirin 81 mg and plavix 75 mg daily x 3 weeks, then PLAVIX alone. Orders adjusted.   Therapy recommendations:  CIR  Disposition:  pending   Hypertensive Urgency  SBP > 200 on arrival  BP now 140-160s  Permissive hypertension (OK if < 220/120) but gradually normalize in 5-7  days . Long-term BP goal normotensive  Hyperlipidemia  Home meds:  No statin  Now on lipitor 40  LDL 109, goal < 70  Continue statin at discharge  Other Stroke Risk Factors  Advanced age  Other Active Problems  Prolonged QTC  Interstitial cystitis   Anxiety   Leg cramps on tylenol, add prn quinine if does not resolve   Hospital day # 1  I have personally obtained history,examined this patient, reviewed notes, independently viewed imaging studies, participated in medical decision making and plan of care.ROS completed by me personally and pertinent positives fully documented  I have made any additions or clarifications directly to the above note.  She presented with nonspecific symptoms of bilateral leg heaviness and weakness and upon arrival did not have focal deficits but MRI does show small bilateral thalamic lacunar infarcts likely from small vessel disease.  Continue ongoing stroke work-up check echocardiogram and CT angiograms.  Recommend aspirin 81 and Plavix 75 mg daily for 3 weeks followed by aspirin alone.  Aggressive risk factor modification.  Transfer to inpatient rehab when bed available.  .Stroke team will sign off.  Follow-up as an outpatient in the stroke clinic with Janett Billow my nurse practitioner in 6 weeks.   Antony Contras, MD Medical Director [facility] Pager: [phone] 04/16/2019 2:24 PM   To contact Stroke Continuity provider, please refer to [email or web address] After hours, contact General Neurology

## 2019-04-16 NOTE — PMR Pre-admission (Signed)
PMR Admission Coordinator Pre-Admission Assessment  Patient: Brooke Collier is an 84 y.o., female MRN: [id number] DOB: [birth year] Height: 5\' 4"  (162.6 cm) Weight: 42.6 kg              Insurance Information HMO: yes    PPO:      PCP:      IPA:      80/20:      OTHER:  PRIMARY: [employer]      Policy#: [id number]      Subscriber: pt CM Name: Dot    Phone#: [phone] ext [phone]     Fax#: [phone] Pre-Cert#: [id number] approved for 7 days with f/u with Lestine Box  At phone [phone] ext [phone] fax [phone]      Employer:  Benefits:  Phone #: [phone]     Name: 3/8 Eff. Date: 02/12/2019     Deduct: none      Out of Pocket Max: $3900      Life Max: none CIR: $295 co pay per day days 1 until 6      SNF: no copay days 1 until 20; $184 co pay per day days 21 until; 100 Outpatient: $10 to $40 per visit     Co-Pay: visits per medical neccesity Home Health: 100%      Co-Pay: visits per medical neccesity DME: 80%     Co-Pay: 20% Providers: in network   SECONDARY: none       Medicaid Application Date:       Case Manager:  Disability Application Date:       Case Worker:   The "Data Collection Information Summary" for patients in Inpatient Rehabilitation Facilities with attached "Privacy Act [place] Records" was provided and verbally reviewed with: Patient and Family  Emergency Contact Information Contact Information    Name Relation Home Work [place], April Daughter   [phone]   JALEESHA, BUCKEL  [phone]       Current Medical History  Patient Admitting Diagnosis: CVA  History of Present Illness:  Brooke Collier is a 84 y.o. right-handed female with history of hypertension, anxiety, chronic interstitial cystitis.   Patient lives alone independent prior to admission.    She presented on 05/11/2019 with lower extremity weakness.  Blood pressure 170/79.  Cranial CT unremarkable for acute intracranial process.  Patient did not receive  TPA.  MRI showed acute on chronic small vessel ischemia in the thalami.  An 8 mm right lateral thalamic internal capsule lacune is noted along with evidence of punctate contralateral left ventral thalamic infarct.  CT angiogram of head and neck negative for large vessel occlusion.  Echocardiogram showed ejection fraction of 65% without emboli. Aspirin and Plavix for CVA prophylaxis.  Subcutaneous Lovenox for DVT prophylaxis.  Maintain on a regular consistency diet.    Complete NIHSS TOTAL: 2 Glasgow Coma Scale Score: 15  Past Medical History  Past Medical History:  Diagnosis Date  . Allergy   . Chronic interstitial cystitis   . Hypertension     Family History  family history includes Anxiety disorder in her brother and sister; Heart disease in her brother, father, and mother.  Prior Rehab/Hospitalizations:  Has the patient had prior rehab or hospitalizations prior to admission? Yes  Has the patient had major surgery during 100 days prior to admission? No  Current Medications   Current Facility-Administered Medications:  .   stroke: mapping our early stages of recovery book, , Does not apply, Once, Caroline More, DO .  acetaminophen (TYLENOL) tablet 650 mg, 650 mg, Oral, Q4H PRN, 650 mg at 04/17/19 2223 **OR** acetaminophen (TYLENOL) 160 MG/5ML solution 650 mg, 650 mg, Per Tube, Q4H PRN **OR** acetaminophen (TYLENOL) suppository 650 mg, 650 mg, Rectal, Q4H PRN, Caroline More, DO .  ALPRAZolam Duanne Moron) tablet 0.125 mg, 0.125 mg, Oral, QHS PRN, Gladys Damme, MD, 0.125 mg at 04/18/19 2221 .  alum & mag hydroxide-simeth (MAALOX/MYLANTA) 200-200-20 MG/5ML suspension 30 mL, 30 mL, Oral, PRN, Dickie La, MD, 30 mL at 04/16/19 2031 .  amLODipine (NORVASC) tablet 2.5 mg, 2.5 mg, Oral, Daily, Patel, Poonam, MD .  aspirin EC tablet 81 mg, 81 mg, Oral, Q breakfast, Biby, Sharon L, NP, 81 mg at 04/19/19 0916 .  atorvastatin (LIPITOR) tablet 40 mg, 40 mg, Oral, q1800, Tammi Klippel, Sherin, DO, 40  mg at 04/18/19 1753 .  busPIRone (BUSPAR) tablet 5 mg, 5 mg, Oral, BID, Gladys Damme, MD, 5 mg at 04/19/19 0916 .  clopidogrel (PLAVIX) tablet 75 mg, 75 mg, Oral, Daily, Biby, Sharon L, NP, 75 mg at 04/19/19 0915 .  enoxaparin (LOVENOX) injection 30 mg, 30 mg, Subcutaneous, Q24H, Abraham, Sherin, DO, 30 mg at 04/18/19 1205 .  irbesartan (AVAPRO) tablet 150 mg, 150 mg, Oral, Daily, Meccariello, Bernita Raisin, DO, 150 mg at 04/19/19 0916 .  phenazopyridine (PYRIDIUM) tablet 100 mg, 100 mg, Oral, Daily PRN, Meccariello, Bernita Raisin, DO, 100 mg at 04/18/19 1920 .  polyvinyl alcohol (LIQUIFILM TEARS) 1.4 % ophthalmic solution 1 drop, 1 drop, Both Eyes, PRN, Gladys Damme, MD  Patients Current Diet:  Diet Order            Diet regular Room service appropriate? Yes; Fluid consistency: Thin  Diet effective now              Precautions / Restrictions Precautions Precautions: Fall Precaution Comments: standing instability Restrictions Weight Bearing Restrictions: No   Has the patient had 2 or more falls or a fall with injury in the past year?No  Prior Activity Level  Independent with mobility and ADLS pta  Prior Functional Level Prior Function Level of Independence: Independent  Self Care: Did the patient need help bathing, dressing, using the toilet or eating?  Independent  Indoor Mobility: Did the patient need assistance with walking from room to room (with or without device)? Independent  Stairs: Did the patient need assistance with internal or external stairs (with or without device)? Independent  Functional Cognition: Did the patient need help planning regular tasks such as shopping or remembering to take medications? Independent  Home Assistive Devices / Equipment Home Assistive Devices/Equipment: None Home Equipment: Environmental consultant - 2 wheels, Cane - single point, Civil engineer, contracting  Prior Device Use: Indicate devices/aids used by the patient prior to current illness, exacerbation or  injury? None of the above  Current Functional Level Cognition  Overall Cognitive Status: Within Functional Limits for tasks assessed Orientation Level: Oriented X4    Extremity Assessment (includes Sensation/Coordination)  Upper Extremity Assessment: LUE deficits/detail LUE Deficits / Details: proprioceptive deficits noted today, some dysmetria,  LUE Sensation: decreased proprioception  Lower Extremity Assessment: Defer to PT evaluation LLE Deficits / Details: Increased weaknes noted in LLE, especially functionally. Pt with difficulty lifting LLE to take a step.     ADLs  Overall ADL's : Needs assistance/impaired Eating/Feeding: Set up, Sitting, Minimal assistance Eating/Feeding Details (indicate cue type and reason): intermittent steadying assist to seat in unsupported sitting position (EOB) Grooming: Minimal assistance, Sitting Upper Body Bathing: Minimal assistance, Sitting Lower Body Bathing:  Moderate assistance, Sit to/from stand, +2 for safety/equipment Upper Body Dressing : Minimal assistance, Sitting Lower Body Dressing: Moderate assistance, +2 for safety/equipment, Sit to/from stand Toilet Transfer: Moderate assistance, Stand-pivot, Maximal assistance, BSC, RW Toileting- Clothing Manipulation and Hygiene: Sit to/from stand, Moderate assistance, +2 for safety/equipment General ADL Comments: Pt completed bed mobility, 2x sit<>stands, side stepped along EOB, and walked in place. 2 seated resting breaks incorporated. + lightheaded/swimmyheaded with positional chane but not objectively found to be orthostatic    Mobility  Overal bed mobility: Needs Assistance Bed Mobility: Supine to Sit Supine to sit: Min assist Sit to supine: Min assist General bed mobility comments: min assist to support lift on trunk but then is more stable in sitting    Transfers  Overall transfer level: Needs assistance Equipment used: Rolling walker (2 wheeled) Transfers: Sit to/from Stand Sit to  Stand: Min assist General transfer comment: min from side of bed with walker    Ambulation / Gait / Stairs / Wheelchair Mobility  Ambulation/Gait Ambulation/Gait assistance: Herbalist (Feet): 15 Feet Assistive device: Rolling walker (2 wheeled), 1 person hand held assist Gait Pattern/deviations: Step-to pattern, Step-through pattern, Decreased stride length, Decreased weight shift to left, Wide base of support General Gait Details: wide careful steps with cues during turns to keep shifting LLE Gait velocity: decreased    Posture / Balance Dynamic Sitting Balance Sitting balance - Comments: tendency to lean to the left, able to self correct  Balance Overall balance assessment: Needs assistance Sitting-balance support: Feet supported, Bilateral upper extremity supported Sitting balance-Leahy Scale: Fair Sitting balance - Comments: tendency to lean to the left, able to self correct  Postural control: Posterior lean Standing balance support: Bilateral upper extremity supported, During functional activity Standing balance-Leahy Scale: Poor Standing balance comment: Reliant on UE and external support; posterior lean initially    Special needs/care consideration BiPAP/CPAP CPM Continuous Drip IV Dialysis        Life Vest Oxygen Special Bed Trach Size Wound Vac  Skin                               Bowel mgmt:continent Bladder mgmt:continent Diabetic mgmt Behavioral consideration  Anxiety at baseline; Xanax pta. Very anxious about pandemic and private room recommended due to high anxiety level Chemo/radiation  Designated visitor is April, daughter    Previous Home Environment  Living Arrangements: Alone  Lives With: Alone Available Help at Discharge: Family, Available 24 hours/day(son and dtr can work remote and provide 24/7 assist) Type of Home: [place]: Two level, Able to live on main level with bedroom/bathroom Home Access: Ramped entrance Bathroom  Shower/Tub: Multimedia programmer: Handicapped height Tift Accessibility: Yes How Accessible: Accessible via [place]: No  Discharge [place] for Discharge Living Setting: Patient's home, Alone Type of Home at Discharge: [place]: Two level, Able to live on main level with bedroom/bathroom Discharge Home Access: [place] entrance Discharge Bathroom Shower/Tub: Walk-in shower Discharge Bathroom Toilet: Handicapped height Discharge Bathroom Accessibility: Yes How Accessible: Accessible via walker Does the patient have any problems obtaining your medications?: No  Social/Family/Support Systems Patient Roles: Parent Contact Information: April, daughter Anticipated Caregiver: daughter and son  Anticipated Caregiver's Contact Information: [phone] Ability/Limitations of Caregiver: daughter and son teach and can work remote Careers adviser: 24/7 Discharge Plan Discussed with Primary Caregiver: Yes Is Caregiver In Agreement with Plan?: Yes Does Caregiver/Family have Issues  with Lodging/Transportation while Pt is in Rehab?: No  Goals/Additional Needs Patient/Family Goal for Rehab: supervision ot min assist with PT and OT  Expected length of stay: [facility] 12 to 16 days Special Service Needs: very anxious at baseline and more so with pandemic; was on Xanax pta Pt/Family Agrees to Admission and willing to participate: Yes Program Orientation Provided & Reviewed with Pt/Caregiver Including Roles  & Responsibilities: Yes  Decrease burden of Care through IP rehab admission:   Possible need for SNF placement upon discharge:  Patient Condition: This patient's medical and functional status has changed since the consult dated: 04/16/2019 in which the Rehabilitation Physician determined and documented that the patient's condition is appropriate for intensive rehabilitative care in an inpatient rehabilitation facility. See "History of  Present Illness" (above) for medical update. Functional changes are: overall min to mod assist. Patient's medical and functional status update has been discussed with the Rehabilitation physician and patient remains appropriate for inpatient rehabilitation. Will admit to inpatient rehab today.  Preadmission Screen Completed By:  Cleatrice Burke, RN, 04/19/2019 11:29 AM ______________________________________________________________________   Discussed status with Dr. Letta Pate on 04/19/2019 at  77 and received approval for admission today.  Admission Coordinator:  Cleatrice Burke, time [id number] Date 04/19/2019

## 2019-04-16 NOTE — Progress Notes (Signed)
Physical Therapy Treatment Patient Details Name: DONATELLA BUI MRN: [id number] DOB: 22-Apr-1935 Today's Date: 04/16/2019    History of Present Illness Pt is an 84 y/o female admitted secondary to weakness in LEs (LLE>RLE). 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 PMH includes HTN, and interstitial cystitis.     PT Comments    Pt did very well with tx today, on therapist arrival was completing clean up with nurse tech, noted pt able to lay supine and complete full bridge with no assistance. Able to sit edge of bed with stand by assist and cues, sit<>stand with mod a and RW, standing worked on balance and coordination, control over LUE and LLE. Pt still unaware when L knee buckles needing cues to look at knee to see if its bucked, with cues pt is able to extend knee. With step by step instructions and mod a pt was able to ambulate approx 52ft with RW. Pt did very well with ambulation this session. Pt had been c/o feeling extremely dizzy with supine to sit,s at edge of bed approx 10 mins (had physicians visit during this time), was not able to get orthostatic readings during this time. Overall pt is progressing with her strength and also independence with functional mob.     Follow Up Recommendations  CIR     Equipment Recommendations       Recommendations for Other Services       Precautions / Restrictions Precautions Precautions: Fall Precaution Comments: unsteady in sitting and standing Restrictions Weight Bearing Restrictions: No    Mobility  Bed Mobility Overal bed mobility: Needs Assistance Bed Mobility: Supine to Sit     Supine to sit: Min assist        Transfers Overall transfer level: Needs assistance Equipment used: Rolling walker (2 wheeled) Transfers: Sit to/from Stand Sit to Stand: Mod assist         General transfer comment: stannding edge of bed worked on LUE and LLE control also balance and coordination  Ambulation/Gait Ambulation/Gait assistance:  Mod assist Gait Distance (Feet): 5 Feet Assistive device: Rolling walker (2 wheeled) Gait Pattern/deviations: Step-to pattern Gait velocity: decreased   General Gait Details: able to take steps from bedside to recliner approx 5 ft away, worked on sequencing, arm and foor placements also walker propagation   Stairs             Wheelchair Mobility    Modified Rankin (Stroke Patients Only) Modified Rankin (Stroke Patients Only) Pre-Morbid Rankin Score: No symptoms Modified Rankin: Moderate disability     Balance Overall balance assessment: Needs assistance Sitting-balance support: Feet supported;Bilateral upper extremity supported Sitting balance-Leahy Scale: Fair     Standing balance support: During functional activity;Bilateral upper extremity supported Standing balance-Leahy Scale: Poor                              Cognition Arousal/Alertness: Awake/alert Behavior During Therapy: WFL for tasks assessed/performed Overall Cognitive Status: Within Functional Limits for tasks assessed                                        Exercises      General Comments        Pertinent Vitals/Pain Pain Assessment: No/denies pain    Home Living     Available Help at Discharge: Family;Available 24 hours/day(son and dtr can work remote  and provide 24/7 assist)                Prior Function            PT Goals (current goals can now be found in the care plan section) Acute Rehab PT Goals Patient Stated Goal: get over dizziness and go to rehab PT Goal Formulation: With patient Time For Goal Achievement: 04/28/19 Potential to Achieve Goals: Good Progress towards PT goals: Progressing toward goals    Frequency    Min 4X/week      PT Plan Current plan remains appropriate    Co-evaluation              AM-PAC PT "6 Clicks" Mobility   Outcome Measure  Help needed turning from your back to your side while in a flat bed  without using bedrails?: None Help needed moving from lying on your back to sitting on the side of a flat bed without using bedrails?: A Little Help needed moving to and from a bed to a chair (including a wheelchair)?: A Lot Help needed standing up from a chair using your arms (e.g., wheelchair or bedside chair)?: A Lot Help needed to walk in hospital room?: A Lot Help needed climbing 3-5 steps with a railing? : A Lot 6 Click Score: 15    End of Session Equipment Utilized During Treatment: Gait belt Activity Tolerance: Patient limited by fatigue;Patient limited by lethargy Patient left: in chair;with call bell/phone within reach;with chair alarm set Nurse Communication: Mobility status PT Visit Diagnosis: Unsteadiness on feet (R26.81);Muscle weakness (generalized) (M62.81)     Time: [id number] PT Time Calculation (min) (ACUTE ONLY): 38 min  Charges:  $Gait Training: 8-22 mins $Therapeutic Activity: 8-22 mins $Neuromuscular Re-education: 8-22 mins                     Horald Chestnut, PT    Delford Field 04/16/2019, 2:02 PM

## 2019-04-16 NOTE — Progress Notes (Addendum)
Family Medicine Teaching Service Daily Progress Note Intern Pager: [phone]  Patient name: Brooke Collier Medical record number: [id number] Date of birth: 09-18-1935 Age: 84 y.o. Gender: female  Primary Care Provider: Laurey Morale, MD Consultants: Neurology Code Status: Full  Pt Overview and Major Events to Date:  04/14/19 admitted, CT head, MR brain with R thalamic lacunar infarct 04/15/19 CTA no LVO, ECHO wnl  Assessment and Plan: Brooke Collier is a 84 y.o. female presenting with LE weakness/numbess beginning at [facility] 04/14/19. 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 Echo WNL, G1DD. Improved exam today, 5/5 strength bilaterally.  Neurology recommends decreasing ASA 81 mg x 3 weeks then stopping ASA altogether, plavix 75 mg (continue on d/c), as well as continuing atorvastatin.  Permissive HTN for 5-7 days, goal of normotensive thereafter. PT/OT recommending CIR, awaiting insurance approval on Monday. Patient had some leg cramps that improved with tylenol. If occurs again, Neuro recommended quinine. -neuro signed off; appreciate recs: ASA 81mg  x3wks then d/c, Plavix 75 mg, atorvastatin 40 mg -Hold home amlodipine, benicar, restart March 7-9 (5-7 days after admission) -PT/OT  HTN Patient reports recent labile blood pressures at home. Is very compliant on her medications. Home meds include benicar 40mg  qhs, norvasc 2.5mg  po qam. States she takes both in AM. Per PCP telemedicine note from 02/17/19, last 3 encounter BP readings were 160/76, 154/68, 130/58.  At admission BPs very elevated to 190s over 130s.  Overnight blood pressure largely normotensive: SBP 134-163, DBP 69-85, most  recently 134/72. -Hold home meds for permissive hypertension for 5-7 days: restart on March 7-9  Prolonged QTC EKG with QTc 524. Normal QTC in 2018.  - AM EKG pending - avoid QTC prolonging agents   Interstitial cystitis  Patient reports she follows with outpatient urology and takes an unknown pill. Was unable to determine the name. PCP notes list cysta q.  -Can restart meds at home - f/u as outpatient   Anxiety  Patient has h/o severe anxiety, made worse by global pandemic. Has been avoiding her PCP's office due to Max concerns. Patient previously prescribed 0.125 mg xanax, however, she is a frail 83yo with previous h/o of falls and leg fx. Since this is a Beers drug and addicting without truly treating anxiety, I do not recommend this drug. Brooke Collier agrees and is ok with stopping this medication. She had some hydroxyzine last night, but is very sleepy today and had an experience where she thought the telephone was ringing, but it wasn't. D/c'd hydroxyzine as well. Discussed SSRI and therapy options. Patient does not want to start a SSRI at this time, but is interested in therapy. She would also like buspar and to have the chaplains talk with her. -Buspar 5mg  BID (can increase if well tolerated) -Chaplain consult placed -Consider cognitive behavioral therapy as an outpatient  FEN/GI: regular diet Prophylaxis: lovenox   Disposition: to CIR, pending approval  Subjective:  Patient had a strange experience overnight where she thought a phone was ringing, but also thought she was at home and had to get up to answer her phone in another room (at home her phone is in another room, not the bedroom). She has felt very tired today and does not want hydroxyzine  or to trial a SSRI. She prefers to try Buspar and talking with the chaplains while in the hospital.  Objective: Temp:  [97.4 F (36.3 C)-98.1 F (36.7 C)] 97.8 F (36.6 C) (03/05 0535) Pulse Rate:  [51-65] 55 (03/05 0535) Resp:   [16-18] 18 (03/05 0535) BP: (134-163)/(69-85) 134/72 (03/05 0535) SpO2:  [98 %-100 %] 98 % (03/05 0020) Physical Exam: General: Awake and alert x4, laying in bed, no acute distress, pleasant older woman Cardiovascular: RRR, no M/R/G Respiratory: Clear to auscultation bilaterally, no wheezes, rales or rhonchi Abdomen: Soft, NT, ND, normal bowel sounds present Extremities: Warm, dry, no edema Neuro: Cranial Nerves: II: PERRL.  III,IV, VI: EOMI without ptosis or diplopia.  V: Facial sensation is symmetric to touch VII: Facial movement is symmetric.  VIII: hearing is intact to voice X: Palate elevates symmetrically XI: Shoulder shrug is symmetric. XII: tongue is midline without atrophy or fasciculations.  Motor: Tone is normal. Bulk is normal. 5/5 strength was present in left UE and LE, 5/5 on RUE and RLE Sensory: Sensation is symmetric to light touch   Laboratory: Recent Labs  Lab 04/14/19 1052 04/14/19 1052 04/14/19 1058 04/15/19 0337 04/16/19 0602  WBC 4.2  --   --  4.5 4.3  HGB 14.4   < > 13.9 12.2 12.6  HCT 43.9   < > 41.0 36.4 37.2  PLT 193  --   --  200 198   < > = values in this interval not displayed.   Recent Labs  Lab 04/14/19 1052 04/14/19 1052 04/14/19 1058 04/15/19 0337 04/16/19 0602  NA 142   < > 141 142 141  K 3.5   < > 3.5 3.9 3.8  CL 103   < > 102 105 108  CO2 27  --   --  26 26  BUN 16   < > 19 20 16   CREATININE 0.90   < > 0.70 0.75 0.91  CALCIUM 9.2  --   --  8.8* 8.7*  PROT 6.5  --   --   --   --   BILITOT 1.0  --   --   --   --   ALKPHOS 44  --   --   --   --   ALT 27  --   --   --   --   AST 36  --   --   --   --   GLUCOSE 86   < > 83 97 91   < > = values in this interval not displayed.    Imaging/Diagnostic Tests: CT ANGIO HEAD W OR WO CONTRAST  Result Date: 04/15/2019 CLINICAL DATA:  84 year old female code stroke presentation yesterday found to have small right greater than left acute on chronic thalamic lacunar infarcts on MRI.  EXAM: CT ANGIOGRAPHY HEAD AND NECK TECHNIQUE: Multidetector CT imaging of the head and neck was performed using the standard protocol during bolus administration of intravenous contrast. Multiplanar CT image reconstructions and MIPs were obtained to evaluate the vascular anatomy. Carotid stenosis measurements (when applicable) are obtained utilizing NASCET criteria, using the distal internal carotid diameter as the denominator. CONTRAST:  148mL OMNIPAQUE IOHEXOL 350 MG/ML SOLN COMPARISON:  Plain head CT and brain MRI yesterday. FINDINGS: CT HEAD Brain: No acute intracranial hemorrhage identified. No midline shift, mass effect, or evidence of intracranial mass lesion. No ventriculomegaly. Confluent bilateral cerebral white matter hypodensity as well as heterogeneity in both thalami. Increased hypodensity at the right lateral thalamus  since yesterday. No superimposed acute cortically based infarct identified. Calvarium and skull base: Osteopenia. No acute osseous abnormality identified. Paranasal sinuses: Visualized paranasal sinuses and mastoids are stable and well pneumatized. Orbits: No acute orbit or scalp soft tissue finding. CTA NECK Skeleton: Cervical spine degeneration. Osteopenia. No acute osseous abnormality identified. Upper chest: Negative. Other neck: Negative. Aortic arch: The entire arch is not included. No aortic atherosclerosis is evident. Right carotid system: Distal brachiocephalic artery is normal. Normal right CCA origin. Minimal calcified plaque at the right carotid bifurcation mostly affects the ECA. Tortuous right ICA without stenosis. Left carotid system: Visible proximal left CCA is normal. Calcified plaque at the left ICA origin does not result in stenosis. Negative left ICA otherwise to the skull base. Vertebral arteries: Calcified plaque in the proximal right subclavian artery without significant stenosis. Normal right vertebral artery origin. Dominant and mildly ectatic right vertebral  artery is widely patent to the skull base. Visible proximal left subclavian artery and left vertebral artery origin are normal. Non dominant left vertebral artery is patent to the skull base without plaque or stenosis. CTA HEAD Posterior circulation: There is mild calcified plaque in the dominant right V4 segment without stenosis. The left vertebral functionally terminates in PICA. The right AICA appears dominant. Patent basilar artery without stenosis. Normal SCA and right PCA origins. Fetal type left PCA origin. The right posterior communicating artery is diminutive or absent. There is mild irregularity of the bilateral P1 and P2 segments. Otherwise PCA branches are within normal limits. Anterior circulation: Both ICA siphons are patent. On the left there is supraclinoid calcified plaque resulting in about 50% stenosis (series 12, image 94). Normal left posterior communicating artery origin. On the right there is cavernous and supraclinoid calcified plaque with only mild stenosis. Patent carotid termini. Normal MCA and ACA origins. Tortuous A1 segments. Anterior communicating artery and bilateral ACA branches are within normal limits. Left MCA M1 segment and bifurcation are patent without stenosis. Left MCA branches are within normal limits. Right MCA M1 segment and bifurcation are patent without stenosis. Right MCA branches are normal aside from mild posterior M2 branch irregularity on series 16, image 13. Venous sinuses: Early contrast timing, not well evaluated. Anatomic variants: Dominant right vertebral artery, the left functionally terminates in PICA. Fetal type left PCA origin. Review of the MIP images confirms the above findings IMPRESSION: 1. Negative for large vessel occlusion. 2. Generalized arterial ectasia with mild for age atherosclerosis in the head and neck. Calcified plaque results in 50% stenosis of the supraclinoid Left ICA siphon. 3. Expected CT appearance of the right thalamic lacunar infarct.  No mass effect, hemorrhage, or new intracranial abnormality. Electronically Signed   By: Genevie Ann M.D.   On: 04/15/2019 12:52   CT ANGIO NECK W OR WO CONTRAST  Result Date: 04/15/2019 CLINICAL DATA:  84 year old female code stroke presentation yesterday found to have small right greater than left acute on chronic thalamic lacunar infarcts on MRI. EXAM: CT ANGIOGRAPHY HEAD AND NECK TECHNIQUE: Multidetector CT imaging of the head and neck was performed using the standard protocol during bolus administration of intravenous contrast. Multiplanar CT image reconstructions and MIPs were obtained to evaluate the vascular anatomy. Carotid stenosis measurements (when applicable) are obtained utilizing NASCET criteria, using the distal internal carotid diameter as the denominator. CONTRAST:  136mL OMNIPAQUE IOHEXOL 350 MG/ML SOLN COMPARISON:  Plain head CT and brain MRI yesterday. FINDINGS: CT HEAD Brain: No acute intracranial hemorrhage identified. No midline shift, mass effect, or  evidence of intracranial mass lesion. No ventriculomegaly. Confluent bilateral cerebral white matter hypodensity as well as heterogeneity in both thalami. Increased hypodensity at the right lateral thalamus since yesterday. No superimposed acute cortically based infarct identified. Calvarium and skull base: Osteopenia. No acute osseous abnormality identified. Paranasal sinuses: Visualized paranasal sinuses and mastoids are stable and well pneumatized. Orbits: No acute orbit or scalp soft tissue finding. CTA NECK Skeleton: Cervical spine degeneration. Osteopenia. No acute osseous abnormality identified. Upper chest: Negative. Other neck: Negative. Aortic arch: The entire arch is not included. No aortic atherosclerosis is evident. Right carotid system: Distal brachiocephalic artery is normal. Normal right CCA origin. Minimal calcified plaque at the right carotid bifurcation mostly affects the ECA. Tortuous right ICA without stenosis. Left carotid  system: Visible proximal left CCA is normal. Calcified plaque at the left ICA origin does not result in stenosis. Negative left ICA otherwise to the skull base. Vertebral arteries: Calcified plaque in the proximal right subclavian artery without significant stenosis. Normal right vertebral artery origin. Dominant and mildly ectatic right vertebral artery is widely patent to the skull base. Visible proximal left subclavian artery and left vertebral artery origin are normal. Non dominant left vertebral artery is patent to the skull base without plaque or stenosis. CTA HEAD Posterior circulation: There is mild calcified plaque in the dominant right V4 segment without stenosis. The left vertebral functionally terminates in PICA. The right AICA appears dominant. Patent basilar artery without stenosis. Normal SCA and right PCA origins. Fetal type left PCA origin. The right posterior communicating artery is diminutive or absent. There is mild irregularity of the bilateral P1 and P2 segments. Otherwise PCA branches are within normal limits. Anterior circulation: Both ICA siphons are patent. On the left there is supraclinoid calcified plaque resulting in about 50% stenosis (series 12, image 94). Normal left posterior communicating artery origin. On the right there is cavernous and supraclinoid calcified plaque with only mild stenosis. Patent carotid termini. Normal MCA and ACA origins. Tortuous A1 segments. Anterior communicating artery and bilateral ACA branches are within normal limits. Left MCA M1 segment and bifurcation are patent without stenosis. Left MCA branches are within normal limits. Right MCA M1 segment and bifurcation are patent without stenosis. Right MCA branches are normal aside from mild posterior M2 branch irregularity on series 16, image 13. Venous sinuses: Early contrast timing, not well evaluated. Anatomic variants: Dominant right vertebral artery, the left functionally terminates in PICA. Fetal type  left PCA origin. Review of the MIP images confirms the above findings IMPRESSION: 1. Negative for large vessel occlusion. 2. Generalized arterial ectasia with mild for age atherosclerosis in the head and neck. Calcified plaque results in 50% stenosis of the supraclinoid Left ICA siphon. 3. Expected CT appearance of the right thalamic lacunar infarct. No mass effect, hemorrhage, or new intracranial abnormality. Electronically Signed   By: Genevie Ann M.D.   On: 04/15/2019 12:52   MR BRAIN WO CONTRAST  Result Date: 04/14/2019 CLINICAL DATA:  84 year old female code stroke, left-sided weakness. EXAM: MRI HEAD WITHOUT CONTRAST TECHNIQUE: Multiplanar, multiecho pulse sequences of the brain and surrounding structures were obtained without intravenous contrast. COMPARISON:  Plain head CT earlier today. FINDINGS: Brain: There is a small 8 mm focus of restricted diffusion in the dorsal right deep gray nuclei, lateral thalamus near the posterior limb of the right internal capsule (series 5, image 69). No significant T2 or FLAIR hyperintensity at this time. Questionable punctate contralateral anterior left thalamic focus of restricted diffusion (series 7,  image 54). No evidence of associated acute hemorrhage.  No mass effect. Underlying bilateral thalamic and basal ganglia T2 and FLAIR heterogeneity. Scattered chronic microhemorrhages, including in both occipital lobes, left perirolandic cortex on series 14, image 37. Confluent bilateral cerebral white matter T2 and FLAIR hyperintensity. No cortical encephalomalacia identified. No other restricted diffusion. No midline shift, mass effect, evidence of mass lesion, ventriculomegaly, extra-axial collection or acute intracranial hemorrhage. Cervicomedullary junction and pituitary are within normal limits. Mild T2 heterogeneity in the pons. Vascular: Major intracranial vascular flow voids are preserved. The right vertebral artery is dominant. Skull and upper cervical spine: Negative  for age visible cervical spine. Normal bone marrow signal. Sinuses/Orbits: Postoperative changes to the left globe. Paranasal sinuses and mastoids are stable and well pneumatized. Other: Grossly normal internal auditory structures. Scalp and face soft tissues appear negative. IMPRESSION: 1. Acute on chronic small vessel ischemia in the thalami: An 8 mm right lateral thalamic/internal capsule lacune is noted along with evidence of a punctate contralateral left ventral thalamic infarct. 2. No associated acute hemorrhage or mass effect. Occasional chronic micro hemorrhages in conjunction with underlying chronic small vessel disease. Electronically Signed   By: Genevie Ann M.D.   On: 04/14/2019 12:58   DG CHEST PORT 1 VIEW  Result Date: 04/14/2019 CLINICAL DATA:  Headache and left-sided numbness. EXAM: PORTABLE CHEST 1 VIEW COMPARISON:  Chest x-ray 05/04/2016 FINDINGS: The heart is within normal limits in size given the AP projection and portable technique. There is mild tortuosity and calcification of the thoracic aorta. Stable hyperinflation and emphysematous changes but no acute overlying pulmonary process or worrisome pulmonary lesions. No pleural effusion. The bony thorax is intact. IMPRESSION: Stable emphysematous changes and hyperinflation but no acute overlying pulmonary process. Electronically Signed   By: Marijo Sanes M.D.   On: 04/14/2019 14:11   ECHOCARDIOGRAM COMPLETE  Result Date: 04/15/2019    ECHOCARDIOGRAM REPORT   Patient Name:   KEYUNNA SEDBERRY Date of Exam: 04/15/2019 Medical Rec #:  [id number]           Height:       64.0 in Accession #:    [id number]          Weight:       93.9 lb Date of Birth:  [birth year]            BSA:          1.419 m Patient Age:    71 years            BP:           146/71 mmHg Patient Gender: F                   HR:           74 bpm. Exam Location:  Inpatient Procedure: 2D Echo, Cardiac Doppler and Color Doppler Indications:    Stroke  History:        Patient has no prior  history of Echocardiogram examinations.                 Abnormal ECG, Stroke; Risk Factors:Dyslipidemia and                 Hypertension.  Sonographer:    Roseanna Rainbow RDCS Referring Phys: [place]  1. Left ventricular ejection fraction, by estimation, is 60 to 65%. The left ventricle has normal function. The left ventricle has no regional wall motion abnormalities. Left ventricular diastolic parameters  are consistent with Grade I diastolic dysfunction (impaired relaxation).  2. Right ventricular systolic function is normal. The right ventricular size is normal. There is normal pulmonary artery systolic pressure. The estimated right ventricular systolic pressure is [id number] mmHg.  3. The mitral valve is normal in structure and function. No evidence of mitral valve regurgitation. No evidence of mitral stenosis.  4. The aortic valve is normal in structure and function. Aortic valve regurgitation is mild. Mild aortic valve sclerosis is present, with no evidence of aortic valve stenosis.  5. Severe atherosclerotic plaque is seen in the abdominal aorta. The aortic arch could not be well visualized.  6. The inferior vena cava is normal in size with greater than 50% respiratory variability, suggesting right atrial pressure of 3 mmHg. FINDINGS  Left Ventricle: Left ventricular ejection fraction, by estimation, is 60 to 65%. The left ventricle has normal function. The left ventricle has no regional wall motion abnormalities. The left ventricular internal cavity size was normal in size. There is  no left ventricular hypertrophy. Left ventricular diastolic parameters are consistent with Grade I diastolic dysfunction (impaired relaxation). Normal left ventricular filling pressure. Right Ventricle: The right ventricular size is normal. No increase in right ventricular wall thickness. Right ventricular systolic function is normal. There is normal pulmonary artery systolic pressure. The tricuspid regurgitant  velocity is 2.40 m/s, and  with an assumed right atrial pressure of 3 mmHg, the estimated right ventricular systolic pressure is [id number] mmHg. Left Atrium: Left atrial size was normal in size. Right Atrium: Right atrial size was normal in size. Pericardium: There is no evidence of pericardial effusion. Mitral Valve: The mitral valve is normal in structure and function. Normal mobility of the mitral valve leaflets. No evidence of mitral valve regurgitation. No evidence of mitral valve stenosis. Tricuspid Valve: The tricuspid valve is normal in structure. Tricuspid valve regurgitation is mild . No evidence of tricuspid stenosis. Aortic Valve: The aortic valve is normal in structure and function. Aortic valve regurgitation is mild. Aortic regurgitation PHT measures 530 msec. Mild aortic valve sclerosis is present, with no evidence of aortic valve stenosis. Pulmonic Valve: The pulmonic valve was normal in structure. Pulmonic valve regurgitation is not visualized. No evidence of pulmonic stenosis. Aorta: Severe atherosclerotic plaque is seen in the abdominal aorta. The aortic arch could not be well visualized. The aortic root is normal in size and structure. Venous: The inferior vena cava is normal in size with greater than 50% respiratory variability, suggesting right atrial pressure of 3 mmHg. IAS/Shunts: No atrial level shunt detected by color flow Doppler.  LEFT VENTRICLE PLAX 2D LVIDd:         3.70 cm     Diastology LVIDs:         2.50 cm     LV e' lateral:   9.79 cm/s LV PW:         1.10 cm     LV E/e' lateral: 7.1 LV IVS:        1.40 cm     LV e' medial:    5.87 cm/s LVOT diam:     1.90 cm     LV E/e' medial:  11.9 LV SV:         56 LV SV Index:   40 LVOT Area:     2.84 cm  LV Volumes (MOD) LV vol d, MOD A2C: 38.9 ml LV vol d, MOD A4C: 57.0 ml LV vol s, MOD A2C: 17.2 ml LV vol s, MOD A4C:  26.7 ml LV SV MOD A2C:     21.7 ml LV SV MOD A4C:     57.0 ml LV SV MOD BP:      27.4 ml RIGHT VENTRICLE             IVC RV S  prime:     15.90 cm/s  IVC diam: 1.40 cm TAPSE (M-mode): 1.4 cm LEFT ATRIUM             Index       RIGHT ATRIUM           Index LA diam:        2.40 cm 1.69 cm/m  RA Area:     11.20 cm LA Vol (A2C):   40.0 ml 28.19 ml/m RA Volume:   26.90 ml  18.96 ml/m LA Vol (A4C):   36.8 ml 25.94 ml/m LA Biplane Vol: 42.2 ml 29.74 ml/m  AORTIC VALVE LVOT Vmax:   106.00 cm/s LVOT Vmean:  71.800 cm/s LVOT VTI:    0.199 m AI PHT:      530 msec  AORTA Ao Root diam: 2.90 cm Ao Asc diam:  3.10 cm MITRAL VALVE               TRICUSPID VALVE MV Area (PHT): 2.39 cm    TR Peak grad:   23.0 mmHg MV Decel Time: 317 msec    TR Vmax:        240.00 cm/s MV E velocity: 69.80 cm/s MV A velocity: 89.20 cm/s  SHUNTS MV E/A ratio:  0.78        Systemic VTI:  0.20 m                            Systemic Diam: 1.90 cm Dani Gobble Croitoru MD Electronically signed by Sanda Klein MD Signature Date/Time: 04/15/2019/1:24:50 PM    Final    CT HEAD CODE STROKE WO CONTRAST  Result Date: 04/14/2019 CLINICAL DATA:  Code stroke. 84 year old female with left side weakness, last seen normal 0900 hours. EXAM: CT HEAD WITHOUT CONTRAST TECHNIQUE: Contiguous axial images were obtained from the base of the skull through the vertex without intravenous contrast. COMPARISON:  Head CT 05/04/2016. FINDINGS: Brain: No midline shift, mass effect, or evidence of intracranial mass lesion. No ventriculomegaly. No acute intracranial hemorrhage identified. Confluent bilateral cerebral white matter hypodensity has not significantly changed since 2018. Posterior deep white matter capsule involvement as before. Lesser deep gray nuclei heterogeneity also appears stable. No superimposed acute cortically based infarct identified. Vascular: Calcified atherosclerosis at the skull base. No suspicious intracranial vascular hyperdensity. Skull: No acute osseous abnormality identified. Sinuses/Orbits: Visualized paranasal sinuses and mastoids are stable and well pneumatized. Other:  Postoperative changes to the left globe since 2018. No acute orbit or scalp soft tissue findings. ASPECTS [facility] Stroke Program Early CT Score) Total score (0-10 with 10 being normal): 10 IMPRESSION: 1. No acute cortically based infarct or acute intracranial hemorrhage identified. ASPECTS 10. 2. Chronic small vessel disease appears stable by CT since 2018. 3. These results were communicated to Dr. Royal Hawthorn at 11:11 am on 04/14/2019 by text page via the [employer] messaging system. Electronically Signed   By: Genevie Ann M.D.   On: 04/14/2019 11:11    Gladys Damme, MD 04/16/2019, 8:10 AM PGY-1, [place] Intern pager: [phone], text pages welcome

## 2019-04-16 NOTE — Progress Notes (Addendum)
Inpatient Rehabilitation Admissions Coordinator  Inpatient rehab consult received. I met with patient at bedside to discuss rehab options, goals and expectations. She would like me to contact her daughter, April, to further discuss. I will follow up today.  Danne Baxter, RN, MSN Rehab Admissions Coordinator [phone] 04/16/2019 8:33 AM   I spoke with April by phone and they are in agreement and can provide caregiver support. I will begin insurance approval with [facility] and follow up on Monday. I contacted Dr. Chauncey Reading to make her aware that April concerned that patient is not taking her Xanax as pta.  Danne Baxter, RN, MSN Rehab Admissions Coordinator [phone] 04/16/2019 12:29 PM

## 2019-04-17 DIAGNOSIS — F411 Generalized anxiety disorder: Secondary | ICD-10-CM

## 2019-04-17 LAB — URINALYSIS, ROUTINE W REFLEX MICROSCOPIC
Bilirubin Urine: NEGATIVE
Glucose, UA: NEGATIVE mg/dL
Ketones, ur: NEGATIVE mg/dL
Nitrite: NEGATIVE
Protein, ur: NEGATIVE mg/dL
Specific Gravity, Urine: 1.004 — ABNORMAL LOW (ref 1.005–1.030)
pH: 7 (ref 5.0–8.0)

## 2019-04-17 MED ORDER — CEPHALEXIN 250 MG PO CAPS
250.0000 mg | ORAL_CAPSULE | Freq: Four times a day (QID) | ORAL | Status: DC
  Administered 2019-04-17 – 2019-04-18 (×5): 250 mg via ORAL
  Filled 2019-04-17 (×6): qty 1

## 2019-04-17 MED ORDER — ROPINIROLE HCL 0.25 MG PO TABS
0.2500 mg | ORAL_TABLET | Freq: Every evening | ORAL | Status: DC | PRN
  Administered 2019-04-17 (×2): 0.25 mg via ORAL
  Filled 2019-04-17 (×3): qty 1

## 2019-04-17 NOTE — Progress Notes (Signed)
Responded to Spiritual Consult.  Pt feeling anxious asking to speak with Chaplain.  Found Brooke Collier alert and anxious to speak with Chaplain.  Initiated a relationship of care and concern by offering ministry of presence.  Brooke Collier spoke of having had a stroke, of her desire to continue rehab here at [facility], of how she and her husband (deceased) had been pro-active in their health, of how Covid-19 has prevented her from exercise, eating as well as she is accustomed to and of isolation from friends for the past year.  Our visit was terminated when PT arrived.  PT offered to come back at a later time, but Chaplain encouraged her to stay because Brooke Collier had expressed how eager she is to get her strength back so she can go back to independent living in her own home.  Brooke Collier concurred.  Bid Brooke Collier a blessed day, confident she had the opportunity to express most of what she wanted to share with Chaplain in our 30-minute visit.De Burrs Chaplain Resident

## 2019-04-17 NOTE — Progress Notes (Addendum)
FPTS Interim Progress Note  S: The patient was experiencing dysuria and lower abdominal pain.  Reported to the bedside in order to examine the patient.  Patient reports that 2 nights ago she had dysuria but it improved after receiving hydroxyzine.  Tonight patient states that her burning with urination has reoccurred.  She reports that it feels somewhat better after hydrating with water.  She states that she does have some soreness in the lower part of her abdomen that she thinks is associated with her interstitial cystitis.  Patient denies fever, chills, CVA tenderness or gross blood in her urine.  O: BP (!) 162/80 (BP Location: Left Arm)   Pulse (!) 55   Temp 97.9 F (36.6 C) (Oral)   Resp 17   Ht 5\' 4"  (1.626 m)   Wt 42.6 kg   SpO2 97%   BMI 16.12 kg/m   General: Elderly female, cachectic appearing, in no acute distress, pleasantly conversational Abdomen: Mild tenderness in lower quadrant, bowel sounds heard throughout, abdomen is soft, no masses palpated  A/P: Dysuria  Dysuria in patient with history of interstitial cystitis to be due to UTI or the pain is secondary to her interstitial cystitis.  Patient is not currently on any home medications for this problem. -Urinalysis   -moderate leukocytes, rare bacteria  -Urine culture, pending   Stark Klein, MD 04/17/2019, 3:58 AM PGY-1, [place] Medicine Service pager [phone]

## 2019-04-17 NOTE — Progress Notes (Signed)
Pt reported constipation and difficulty pushing BM out. MD notified and an order received for soap sud enema. Enema completed and effective. Delia Heady RN

## 2019-04-17 NOTE — Progress Notes (Signed)
Family Medicine Teaching Service Daily Progress Note Intern Pager: [phone]  Patient name: Brooke Collier Medical record number: [id number] Date of birth: 11-Feb-1936 Age: 84 y.o. Gender: female  Primary Care Provider: Laurey Morale, MD Consultants: Neurology  Code Status: Full Code   Pt Overview and Major Events to Date:  04/14/19 admitted, CT head, MR brain with R thalamic lacunar infarct 04/15/19 CTA no LVO, ECHO wnl  Assessment and Plan: Brooke Kowalczyk Wilkinsis a 84 y.o.femalepresenting with LE weakness/numbess beginning at [facility] 04/14/19. PMH is significant for HTN, interstitial cystitis, anxiety.   Mild Stroke involving bilateral thalamic infarcts, stable Patient denies any headache or blurry vision this morning.  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 R thalamic lacune and punctate L thalamic infarct.  Chronic micro hemorrhages and small vessel disease. CTA head and neck with mild ectasia and atherosclerosis. Left ICA siphon 50% calcified plaque.  Echocardiogram showed left ventricular ejection fraction [id number], grade 1 diastolic dysfunction, normal right ventricular function, normal pulmonary artery pressure, no mitral valve, normal aortic valve mild regurgitation, severe abdominal aortic atherosclerosis and normal inferior vena cava and right atrial pressure.  Hemoglobin A1c 5.0 (WNL) and LDL 109 with goal <70.  On exam, patient appears to have 5/5 strength in bilateral upper and lower extremities, intact sensation in bilateral upper and lower extremities as well as the face. CN 2-12 intact grossly bilaterally.  On exam, patient appears to have equal with somewhat decreased strength 5/5 strength in bilateral upper and lower extremities, normal sensation throughout, and normal mentation.  -neuro signed off -Continue ASA 81mg  x 3 wks then d/c after 3/25 -Continue Plavix 75 mg (will continue after  cessation of ASA)  -Continue atorvastatin 40 mg  -continue to hold home amlodipine, benicar, restart March 7-9 (5-7 days after admission) -PT/OT recs CIR after d/c -stroke team will follow up as outpatient in 6 weeks   HTN BP overnight ranged from 0000000 systolic. Patient denies HA or chest pain. Allowing for permissive HTN given acute stroke for 5-7 days, to end between Mar 7 or 9.  -restart anti-hypertensive meds after 3/7-3/9  Prolonged QTC Last EKG withQTc 477. Normal QTC in 2018. - avoid QTC prolonging agents   Interstitial cystitis Dysuria  Patient reports she follows with outpatient urology and takes an unknown pill. Was unable to determine the name.PCP notes list Cysta-Q.  Overnight, patient reported dysuria, U/A showed moderate leukocytes, hemoglobin, and rare bacteria. Patient was afebrile overnight but does exhibit lower abdominal tenderness to palpation, no CVA tenderness.  -f/u urine culture -start Keflex 250mg  q6hrs 3/6-3/12, d/c or change pending results of urine culx - Can restart meds at home - f/u as outpatient  -Tylenol PRN for pain management   Anxiety Patient has h/o severe anxiety, made worse by global pandemic.  Patient expresses interest in pursuing therapy as outpatient, will recommend upon discharge. Patient states that she feels calm during today's exam.  -continue Buspar 5mg  BID -0.125 mg Xanax as needed at bedtime -Chaplain to see patient   FEN/GI:regular diet  Prophylaxis:lovenox30  Disposition: CIR recommended   Subjective:  Patient is reporting dysuria and lower abdominal soreness that she attributes to interstitial cystitis. Patient denies fever/chills and states that the pain has somewhat improved overnight with continued oral hydration with water. Patient was given requip for complaints of leg cramps/sensation to move them frequently that she attributes to her restless legs syndrome. Patient denies HA or  blurry vision.    Objective: Temp:  [97.6 F (36.4 C)-98.1 F (36.7 C)] 97.6 F (36.4 C) (03/06 0517) Pulse Rate:  [55-66] 63 (03/06 0517) Resp:  [17-18] 17 (03/06 0517) BP: (148-162)/(78-104) 150/82 (03/06 0517) SpO2:  [97 %-99 %] 98 % (03/06 0517)  Physical Exam: General: Cachectic female appearing stated age in no acute distress, pleasantly conversational Cardiovascular: Bradycardic, regular rhythm, no murmurs or gallops appreciated, bilateral radial pulses palpable Respiratory: Clear to auscultation, right lipoma near scapula, no wheezing, no increased work of breathing no retractions, patient is stable on room air Abdomen: Abdomen with minimal tenderness in bilateral lower quadrants, no masses palpated, abdomen is soft with bowel sounds present throughout Extremities: Thin appearing extremities without cyanosis or ecchymosis Neuro: Alert and oriented x4, patient able to follow commands and answers questions appropriately, 5/5 strength in bilateral upper and lower extremities, sensation intact throughout, cranial nerves II-XII grossly intact bilaterally, pupils are equal round and reactive to light bilaterally, extraocular muscles are intact bilaterally, range of motion appears to be normal in all extremities  Laboratory: Recent Labs  Lab 04/14/19 1052 04/14/19 1052 04/14/19 1058 04/15/19 0337 04/16/19 0602  WBC 4.2  --   --  4.5 4.3  HGB 14.4   < > 13.9 12.2 12.6  HCT 43.9   < > 41.0 36.4 37.2  PLT 193  --   --  200 198   < > = values in this interval not displayed.   Recent Labs  Lab 04/14/19 1052 04/14/19 1052 04/14/19 1058 04/15/19 0337 04/16/19 0602  NA 142   < > 141 142 141  K 3.5   < > 3.5 3.9 3.8  CL 103   < > 102 105 108  CO2 27  --   --  26 26  BUN 16   < > 19 20 16   CREATININE 0.90   < > 0.70 0.75 0.91  CALCIUM 9.2  --   --  8.8* 8.7*  PROT 6.5  --   --   --   --   BILITOT 1.0  --   --   --   --   ALKPHOS 44  --   --   --   --   ALT 27  --   --   --   --   AST 36   --   --   --   --   GLUCOSE 86   < > 83 97 91   < > = values in this interval not displayed.    Imaging/Diagnostic Tests: No results found.   Stark Klein, MD 04/17/2019, 7:30 AM PGY-1, [place] Intern pager: [phone], text pages welcome

## 2019-04-17 NOTE — Progress Notes (Signed)
Physical Therapy Treatment Patient Details Name: Brooke Collier MRN: [id number] DOB: [birth year] Today's Date: 04/17/2019    History of Present Illness Pt is an 84 y/o female admitted secondary to weakness in LEs (LLE>RLE). 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 PMH includes HTN, and interstitial cystitis.     PT Comments    Pt is eager to work when PT arrived today, noting her hope to get to rehab soon.  Pt is very attentive to PT, had cues for moving LLE during gait which was a greater struggle to keep moving with wider turn radius situations.  Pt did there ex with assist to LLE and active to resisted RLE, and then up in chair with LE's supported on table.  Follow her acutely as needed to prepare for CIR, focusing on LLE strength and standing support of LLE.   Follow Up Recommendations  CIR     Equipment Recommendations  None recommended by PT    Recommendations for Other Services       Precautions / Restrictions Precautions Precautions: Fall Precaution Comments: standing instability Restrictions Weight Bearing Restrictions: No    Mobility  Bed Mobility Overal bed mobility: Needs Assistance Bed Mobility: Supine to Sit     Supine to sit: Min assist     General bed mobility comments: min assist to support lift on trunk but then is more stable in sitting  Transfers Overall transfer level: Needs assistance Equipment used: Rolling walker (2 wheeled) Transfers: Sit to/from Stand Sit to Stand: Min assist         General transfer comment: min from side of bed with walker  Ambulation/Gait Ambulation/Gait assistance: Min assist Gait Distance (Feet): 15 Feet Assistive device: Rolling walker (2 wheeled);1 person hand held assist Gait Pattern/deviations: Step-to pattern;Step-through pattern;Decreased stride length;Decreased weight shift to left;Wide base of support Gait velocity: decreased   General Gait Details: wide careful steps with cues during turns to keep  shifting LLE   Stairs             Wheelchair Mobility    Modified Rankin (Stroke Patients Only) Modified Rankin (Stroke Patients Only) Pre-Morbid Rankin Score: No symptoms Modified Rankin: Moderate disability     Balance Overall balance assessment: Needs assistance Sitting-balance support: Feet supported;Bilateral upper extremity supported Sitting balance-Leahy Scale: Fair   Postural control: Posterior lean Standing balance support: Bilateral upper extremity supported;During functional activity Standing balance-Leahy Scale: Poor                              Cognition Arousal/Alertness: Awake/alert Behavior During Therapy: WFL for tasks assessed/performed Overall Cognitive Status: Within Functional Limits for tasks assessed                                        Exercises General Exercises - Lower Extremity Ankle Circles/Pumps: AROM;Both;5 reps Long Arc Quad: AAROM;Strengthening;10 reps Heel Slides: AROM;AAROM;Strengthening;10 reps Hip ABduction/ADduction: AROM;AAROM;10 reps Hip Flexion/Marching: AROM;AAROM;10 reps    General Comments General comments (skin integrity, edema, etc.): pt is up to chair with assist, after walking on RW for 15' with careful cues for direction of LLE.  Tends to leave behind with greater turn excursion      Pertinent Vitals/Pain Pain Assessment: No/denies pain    Home Living  Prior Function            PT Goals (current goals can now be found in the care plan section) Acute Rehab PT Goals Patient Stated Goal: get to rehab soon Progress towards PT goals: Progressing toward goals    Frequency    Min 4X/week      PT Plan Current plan remains appropriate    Co-evaluation              AM-PAC PT "6 Clicks" Mobility   Outcome Measure  Help needed turning from your back to your side while in a flat bed without using bedrails?: None Help needed moving from  lying on your back to sitting on the side of a flat bed without using bedrails?: A Little Help needed moving to and from a bed to a chair (including a wheelchair)?: A Little Help needed standing up from a chair using your arms (e.g., wheelchair or bedside chair)?: A Little Help needed to walk in hospital room?: A Little Help needed climbing 3-5 steps with a railing? : Total 6 Click Score: 17    End of Session Equipment Utilized During Treatment: Gait belt Activity Tolerance: Patient limited by fatigue;Patient limited by lethargy Patient left: in chair;with call bell/phone within reach;with chair alarm set Nurse Communication: Mobility status PT Visit Diagnosis: Unsteadiness on feet (R26.81);Muscle weakness (generalized) (M62.81)     Time: [id number] PT Time Calculation (min) (ACUTE ONLY): 28 min  Charges:  $Gait Training: 8-22 mins $Therapeutic Exercise: 8-22 mins                    Ramond Dial 04/17/2019, 2:35 PM  Mee Hives, PT MS Acute Rehab Dept. Number: [place] and [place]

## 2019-04-18 MED ORDER — PHENAZOPYRIDINE HCL 100 MG PO TABS
100.0000 mg | ORAL_TABLET | Freq: Every day | ORAL | Status: DC | PRN
  Administered 2019-04-18: 100 mg via ORAL
  Filled 2019-04-18 (×3): qty 1

## 2019-04-18 MED ORDER — IRBESARTAN 150 MG PO TABS
150.0000 mg | ORAL_TABLET | Freq: Every day | ORAL | Status: DC
  Administered 2019-04-18 – 2019-04-19 (×2): 150 mg via ORAL
  Filled 2019-04-18 (×2): qty 1

## 2019-04-18 NOTE — Progress Notes (Signed)
Family Medicine Teaching Service Daily Progress Note Intern Pager: [phone]  Patient name: Brooke Collier Medical record number: [id number] Date of birth: [birth year] Age: 84 y.o. Gender: female  Primary Care Provider: Laurey Morale, MD Consultants: Neurology  Code Status: Full Code   Pt Overview and Major Events to Date:  04/14/19 admitted, CT head, MR brain with R thalamic lacunar infarct 04/15/19 CTA no LVO, ECHO wnl  Assessment and Plan: Brooke Spainhower Wilkinsis a 84 y.o.femalepresenting with LE weakness/numbess beginning at [facility] 04/14/19. PMH is significant for HTN, interstitial cystitis, anxiety.   Mild Stroke involving bilateral thalamic infarcts, stable Patient awaiting dispo to CIR, should hear from insurance on 3/8. Patient is at baseline functioning, overall weakened, but motor function now same on L as R (presented with L sided weakness). -neuro signed off -Continue ASA 81mg  x 3 wks then d/c after 3/25 -Continue Plavix 75 mg (will continue after cessation of ASA)  -Continue atorvastatin 40 mg  -continue to hold home amlodipine, restart March 7-9 (5-7 days after admission)  - restart benicar today, consider amlodipine tomorrow -PT/OT recs CIR after d/c -stroke team will follow up as outpatient in 6 weeks   HTN BP overnight ranged from 0000000 systolic. Patient denies HA or chest pain. Allowing for permissive HTN given acute stroke for 5-7 days, to end between Mar 7 or 9.  -restart anti-hypertensive meds after 3/7-3/9: plan to start benicar today, possibly amlodipine tomorrow  Prolonged QTC Last EKG withQTc 477. Normal QTC in 2018. - avoid QTC prolonging agents   Interstitial cystitis Dysuria  Patient reports she follows with outpatient urology and takes an unknown pill. Was unable to determine the name.PCP notes list Cysta-Q.  On 3/6, patient reported dysuria, urine cx w/ 30K CFUs E. Coli. VSS, some suprapubic tenderness, no CVA tenderness.  - discontinue Keflex -  pyridium for bladder pain x 2 days - Can restart meds at home - f/u as outpatient  -Tylenol PRN for pain management   Anxiety Patient has h/o severe anxiety, made worse by global pandemic.  Patient expresses interest in pursuing therapy as outpatient, will recommend upon discharge.  -continue Buspar 5mg  BID -0.125 mg Xanax as needed at bedtime  FEN/GI:regular diet  Prophylaxis:lovenox30  Disposition: CIR recommended   Subjective:  Patient reports feeling very sleepy today, no urinary pain today, is tender to suprapubic palpation. Overall feeling ok, a little worried about insurance approval tomorrow.  Objective: Temp:  [97.7 F (36.5 C)-98 F (36.7 C)] 97.9 F (36.6 C) (03/07 0524) Pulse Rate:  [56-71] 61 (03/07 0524) Resp:  [16-18] 18 (03/07 0524) BP: (113-143)/(68-78) 141/78 (03/07 0524) SpO2:  [98 %-100 %] 98 % (03/07 0524)  Physical Exam: General: very thin older woman resting comfortably in bed  Cardiovascular: rrr, no m/r/g, 2+ radial pulses Respiratory: CTAB, no increased WOB, no wheezing, rhonchi Abdomen: soft, ND, tender to palpation in suprapubic region, normal bowel sounds present Extremities: thin appearing limbs, no edema or lesions Neuro:  Cranial Nerves: II: PERRL.  III,IV, VI: EOMI without ptosis or diplopia.  V: Facial sensation is symmetric to touch VII: Facial movement is symmetric.  VIII: hearing is intact to voice X: Palate elevates symmetrically XI: Shoulder shrug is symmetric. XII: tongue is midline without atrophy or fasciculations.  Motor: Tone is normal. Bulk is normal. 5/5 strength was present in all four extremities.  Sensory: Sensation is symmetric to light touch  Laboratory: Recent Labs  Lab 04/14/19 1052 04/14/19 1052 04/14/19 1058 04/15/19 0337 04/16/19 0602  WBC 4.2  --   --  4.5 4.3  HGB 14.4   < > 13.9 12.2 12.6  HCT 43.9   < > 41.0 36.4 37.2  PLT 193  --   --  200 198   < > = values in this interval not displayed.    Recent Labs  Lab 04/14/19 1052 04/14/19 1052 04/14/19 1058 04/15/19 0337 04/16/19 0602  NA 142   < > 141 142 141  K 3.5   < > 3.5 3.9 3.8  CL 103   < > 102 105 108  CO2 27  --   --  26 26  BUN 16   < > 19 20 16   CREATININE 0.90   < > 0.70 0.75 0.91  CALCIUM 9.2  --   --  8.8* 8.7*  PROT 6.5  --   --   --   --   BILITOT 1.0  --   --   --   --   ALKPHOS 44  --   --   --   --   ALT 27  --   --   --   --   AST 36  --   --   --   --   GLUCOSE 86   < > 83 97 91   < > = values in this interval not displayed.    Imaging/Diagnostic Tests: No results found.   Gladys Damme, MD 04/18/2019, 8:03 AM PGY-1, [place] Intern pager: [phone], text pages welcome

## 2019-04-18 NOTE — Progress Notes (Signed)
CALL PAGER [phone] for any questions or notifications regarding this patient  FMTS Attending Note: Dorcas Mcmurray MD Please see resident's note which is in progress for complete details She is doing quite well.  Slow progress with physical therapy.  I do think she would benefit from inpatient rehabilitation.  Urine culture was negative so we will discontinue antibiotics.  I think her symptoms are related to her interstitial cystitis which is chronic.  She agrees with this.  Were treating her anxiety with some BuSpar and then she has as needed low-dose Ativan.  She was a little sleepy today, we will see how she does.  We may need to back off on the dosage.

## 2019-04-19 ENCOUNTER — Other Ambulatory Visit: Payer: Self-pay

## 2019-04-19 ENCOUNTER — Inpatient Hospital Stay (HOSPITAL_COMMUNITY)
Admission: RE | Admit: 2019-04-19 | Discharge: 2019-04-29 | DRG: 057 | Disposition: A | Discharge status: Home Health | Payer: Medicare HMO | Source: Intra-hospital | Attending: Physical Medicine & Rehabilitation | Admitting: Physical Medicine & Rehabilitation

## 2019-04-19 ENCOUNTER — Encounter (HOSPITAL_COMMUNITY): Payer: Self-pay

## 2019-04-19 DIAGNOSIS — N3289 Other specified disorders of bladder: Secondary | ICD-10-CM | POA: Diagnosis present

## 2019-04-19 DIAGNOSIS — I6381 Other cerebral infarction due to occlusion or stenosis of small artery: Secondary | ICD-10-CM | POA: Diagnosis not present

## 2019-04-19 DIAGNOSIS — Z90710 Acquired absence of both cervix and uterus: Secondary | ICD-10-CM

## 2019-04-19 DIAGNOSIS — I10 Essential (primary) hypertension: Secondary | ICD-10-CM | POA: Diagnosis present

## 2019-04-19 DIAGNOSIS — Z79899 Other long term (current) drug therapy: Secondary | ICD-10-CM

## 2019-04-19 DIAGNOSIS — I69398 Other sequelae of cerebral infarction: Secondary | ICD-10-CM | POA: Diagnosis present

## 2019-04-19 DIAGNOSIS — Z7982 Long term (current) use of aspirin: Secondary | ICD-10-CM | POA: Diagnosis not present

## 2019-04-19 DIAGNOSIS — H04123 Dry eye syndrome of bilateral lacrimal glands: Secondary | ICD-10-CM | POA: Diagnosis present

## 2019-04-19 DIAGNOSIS — G2581 Restless legs syndrome: Secondary | ICD-10-CM | POA: Diagnosis not present

## 2019-04-19 DIAGNOSIS — I639 Cerebral infarction, unspecified: Secondary | ICD-10-CM | POA: Diagnosis not present

## 2019-04-19 DIAGNOSIS — E785 Hyperlipidemia, unspecified: Secondary | ICD-10-CM | POA: Diagnosis present

## 2019-04-19 DIAGNOSIS — N3010 Interstitial cystitis (chronic) without hematuria: Secondary | ICD-10-CM | POA: Diagnosis present

## 2019-04-19 LAB — CBC
HCT: 38.7 % (ref 36.0–46.0)
Hemoglobin: 13.1 g/dL (ref 12.0–15.0)
MCH: 31.2 pg (ref 26.0–34.0)
MCHC: 33.9 g/dL (ref 30.0–36.0)
MCV: 92.1 fL (ref 80.0–100.0)
Platelets: 223 10*3/uL (ref 150–400)
RBC: 4.2 MIL/uL (ref 3.87–5.11)
RDW: 13.4 % (ref 11.5–15.5)
WBC: 4.7 10*3/uL (ref 4.0–10.5)
nRBC: 0 % (ref 0.0–0.2)

## 2019-04-19 LAB — URINE CULTURE: Culture: 30000 — AB

## 2019-04-19 LAB — CREATININE, SERUM
Creatinine, Ser: 0.95 mg/dL (ref 0.44–1.00)
GFR calc Af Amer: >60 mL/min (ref >60)
GFR calc non Af Amer: 56 mL/min — ABNORMAL LOW (ref >60)

## 2019-04-19 MED ORDER — ATORVASTATIN CALCIUM 40 MG PO TABS
40.0000 mg | ORAL_TABLET | Freq: Every day | ORAL | Status: DC
  Administered 2019-04-19 – 2019-04-28 (×10): 40 mg via ORAL
  Filled 2019-04-19 (×10): qty 1

## 2019-04-19 MED ORDER — PHENAZOPYRIDINE HCL 100 MG PO TABS: 100.0000 mg | ORAL_TABLET | Freq: Every day | ORAL | 0 refills | Status: DC | PRN

## 2019-04-19 MED ORDER — ASPIRIN 81 MG PO TBEC: 81.0000 mg | DELAYED_RELEASE_TABLET | Freq: Every day | ORAL | 0 refills | Status: DC

## 2019-04-19 MED ORDER — POLYVINYL ALCOHOL 1.4 % OP SOLN
1.0000 [drp] | OPHTHALMIC | Status: DC | PRN
  Filled 2019-04-19: qty 15

## 2019-04-19 MED ORDER — ACETAMINOPHEN 160 MG/5ML PO SOLN: 650.0000 mg | ORAL | Status: DC | PRN

## 2019-04-19 MED ORDER — OLMESARTAN MEDOXOMIL 40 MG PO TABS: 20.0000 mg | ORAL_TABLET | Freq: Every day | ORAL | Status: DC

## 2019-04-19 MED ORDER — ENOXAPARIN SODIUM 30 MG/0.3ML ~~LOC~~ SOLN: 30.0000 mg | SUBCUTANEOUS | Status: DC

## 2019-04-19 MED ORDER — AMLODIPINE BESYLATE 2.5 MG PO TABS
2.5000 mg | ORAL_TABLET | Freq: Every day | ORAL | Status: DC
  Administered 2019-04-19: 2.5 mg via ORAL
  Filled 2019-04-19: qty 1

## 2019-04-19 MED ORDER — PHENAZOPYRIDINE HCL 100 MG PO TABS
100.0000 mg | ORAL_TABLET | Freq: Every day | ORAL | Status: DC | PRN
  Administered 2019-04-20 – 2019-04-24 (×3): 100 mg via ORAL
  Filled 2019-04-19 (×5): qty 1

## 2019-04-19 MED ORDER — SORBITOL 70 % SOLN
30.0000 mL | Freq: Every day | Status: DC | PRN
  Administered 2019-04-25: 30 mL via ORAL
  Filled 2019-04-19: qty 30

## 2019-04-19 MED ORDER — ALPRAZOLAM 0.25 MG PO TABS: 0.1250 mg | ORAL_TABLET | Freq: Every evening | ORAL | 0 refills | Status: DC | PRN

## 2019-04-19 MED ORDER — ENOXAPARIN SODIUM 30 MG/0.3ML ~~LOC~~ SOLN
30.0000 mg | SUBCUTANEOUS | Status: DC
  Administered 2019-04-20 – 2019-04-26 (×7): 30 mg via SUBCUTANEOUS
  Filled 2019-04-19 (×7): qty 0.3

## 2019-04-19 MED ORDER — ACETAMINOPHEN 650 MG RE SUPP: 650.0000 mg | RECTAL | Status: DC | PRN

## 2019-04-19 MED ORDER — AMLODIPINE BESYLATE 2.5 MG PO TABS
2.5000 mg | ORAL_TABLET | Freq: Every day | ORAL | Status: DC
  Administered 2019-04-20 – 2019-04-29 (×10): 2.5 mg via ORAL
  Filled 2019-04-19 (×10): qty 1

## 2019-04-19 MED ORDER — ASPIRIN EC 81 MG PO TBEC
81.0000 mg | DELAYED_RELEASE_TABLET | Freq: Every day | ORAL | Status: DC
  Administered 2019-04-20 – 2019-04-29 (×10): 81 mg via ORAL
  Filled 2019-04-19 (×10): qty 1

## 2019-04-19 MED ORDER — BUSPIRONE HCL 5 MG PO TABS
5.0000 mg | ORAL_TABLET | Freq: Two times a day (BID) | ORAL | Status: DC
  Administered 2019-04-19 – 2019-04-29 (×20): 5 mg via ORAL
  Filled 2019-04-19 (×20): qty 1

## 2019-04-19 MED ORDER — CLOPIDOGREL BISULFATE 75 MG PO TABS
75.0000 mg | ORAL_TABLET | Freq: Every day | ORAL | Status: DC
  Administered 2019-04-20 – 2019-04-29 (×10): 75 mg via ORAL
  Filled 2019-04-19 (×10): qty 1

## 2019-04-19 MED ORDER — ALPRAZOLAM 0.25 MG PO TABS
0.1250 mg | ORAL_TABLET | Freq: Every evening | ORAL | Status: DC | PRN
  Administered 2019-04-19 – 2019-04-29 (×10): 0.125 mg via ORAL
  Filled 2019-04-19 (×11): qty 1

## 2019-04-19 MED ORDER — ATORVASTATIN CALCIUM 40 MG PO TABS: 40.0000 mg | ORAL_TABLET | Freq: Every day | ORAL | 0 refills | Status: DC

## 2019-04-19 MED ORDER — BUSPIRONE HCL 5 MG PO TABS: 5.0000 mg | ORAL_TABLET | Freq: Two times a day (BID) | ORAL | Status: DC

## 2019-04-19 MED ORDER — ACETAMINOPHEN 325 MG PO TABS
650.0000 mg | ORAL_TABLET | ORAL | Status: DC | PRN
  Administered 2019-04-21 – 2019-04-29 (×8): 650 mg via ORAL
  Filled 2019-04-19 (×9): qty 2

## 2019-04-19 MED ORDER — IRBESARTAN 75 MG PO TABS
150.0000 mg | ORAL_TABLET | Freq: Every day | ORAL | Status: DC
  Administered 2019-04-20 – 2019-04-29 (×10): 150 mg via ORAL
  Filled 2019-04-19 (×11): qty 2

## 2019-04-19 MED ORDER — CLOPIDOGREL BISULFATE 75 MG PO TABS: 75.0000 mg | ORAL_TABLET | Freq: Every day | ORAL | Status: DC

## 2019-04-19 NOTE — Progress Notes (Signed)
Occupational Therapy Treatment Patient Details Name: Brooke Collier MRN: [id number] DOB: Aug 11, 1935 Today's Date: 04/19/2019    History of present illness Pt is an 84 y/o female admitted secondary to weakness in LEs (LLE>RLE). 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 PMH includes HTN, and interstitial cystitis.    OT comments  Pt presents supine in bed very pleasant and eager to work with therapy. Pt continues to require min-modA for functional transfers using RW, transferring OOB to [facility] and then taking few steps to recliner. Pt with improvements noted in LUE strength but she endorses she continues to have difficulties with LUE coordination. BP monitored and stable throughout session. Feel she remains appropriate for CIR level therapies at time of discharge. Will continue to follow acutely.   Follow Up Recommendations  CIR    Equipment Recommendations  Other (comment)(TBD)          Precautions / Restrictions Precautions Precautions: Fall Restrictions Weight Bearing Restrictions: No       Mobility Bed Mobility Overal bed mobility: Needs Assistance Bed Mobility: Supine to Sit     Supine to sit: Min guard;HOB elevated     General bed mobility comments: for safety  Transfers Overall transfer level: Needs assistance Equipment used: Rolling walker (2 wheeled) Transfers: Sit to/from [facility] Sit to Stand: Min assist Stand pivot transfers: Min assist;Mod assist       General transfer comment: boosting and steadying assist at [place], pt continues to have difficulty advancing LLE with taking steps, transferred to [facility] and then taking few steps to recliner    Balance Overall balance assessment: Needs assistance Sitting-balance support: Feet supported;Bilateral upper extremity supported Sitting balance-Leahy Scale: Fair   Postural control: Posterior lean Standing balance support: Bilateral upper extremity supported;During functional activity Standing  balance-Leahy Scale: Poor                             ADL either performed or assessed with clinical judgement   ADL Overall ADL's : Needs assistance/impaired     Grooming: Wash/dry hands;Set up;Sitting                   Toilet Transfer: Minimal assistance;Moderate assistance;Stand-pivot;BSC;RW   Toileting- Clothing Manipulation and Hygiene: Minimal assistance;Moderate assistance;Sitting/lateral lean;Sit to/from stand Toileting - Clothing Manipulation Details (indicate cue type and reason): pt performing pericare via lateral leans      Functional mobility during ADLs: Minimal assistance;Moderate assistance;Rolling walker                         Cognition Arousal/Alertness: Awake/alert Behavior During Therapy: WFL for tasks assessed/performed;Anxious Overall Cognitive Status: No family/caregiver present to determine baseline cognitive functioning                                 General Comments: word finding difficulties         Exercises Exercises: Other exercises;General Upper Extremity General Exercises - Upper Extremity Shoulder Flexion: AROM;Left;10 reps;Seated Digit Composite Flexion: AROM;Left;10 reps;Seated Composite Extension: AROM;Left;10 reps;Seated Other Exercises Other Exercises: LUE digit opposition, x5 reps   Shoulder Instructions       General Comments pt continues to report some dizziness with mobility, BP 129/75 start of session while sitting up in bed, 148/73 post transfer to [facility]    Pertinent Vitals/ Pain       Pain Assessment: Faces Faces Pain  Scale: Hurts a little bit Pain Location: generalized, bil UEs Pain Descriptors / Indicators: Discomfort;Sore Pain Intervention(s): Limited activity within patient's tolerance;Monitored during session;Repositioned  Home Living                                          Prior Functioning/Environment              Frequency  Min 2X/week         Progress Toward Goals  OT Goals(current goals can now be found in the care plan section)  Progress towards OT goals: Progressing toward goals  Acute Rehab OT Goals Patient Stated Goal: get to rehab soon OT Goal Formulation: With patient Time For Goal Achievement: 04/29/19 Potential to Achieve Goals: Good  Plan Discharge plan remains appropriate    Co-evaluation                 AM-PAC OT "6 Clicks" Daily Activity     Outcome Measure   Help from another person eating meals?: None Help from another person taking care of personal grooming?: A Little Help from another person toileting, which includes using toliet, bedpan, or urinal?: A Little Help from another person bathing (including washing, rinsing, drying)?: A Lot Help from another person to put on and taking off regular upper body clothing?: A Little Help from another person to put on and taking off regular lower body clothing?: A Lot 6 Click Score: 17    End of Session Equipment Utilized During Treatment: Rolling walker;Gait belt  OT Visit Diagnosis: Unsteadiness on feet (R26.81);Other abnormalities of gait and mobility (R26.89);Ataxia, unspecified (R27.0);Other symptoms and signs involving the nervous system (R29.898);Hemiplegia and hemiparesis Hemiplegia - Right/Left: Left   Activity Tolerance Patient tolerated treatment well   Patient Left in chair;with call bell/phone within reach;with chair alarm set   Nurse Communication Mobility status        Time: 1004-1040 OT Time Calculation (min): 36 min  Charges: OT General Charges $OT Visit: 1 Visit OT Treatments $Self Care/Home Management : 8-22 mins $Therapeutic Activity: 8-22 mins  Lou Cal, OT Acute Rehabilitation Services Pager [phone] Office [phone]   Raymondo Band 04/19/2019, 1:28 PM

## 2019-04-19 NOTE — Progress Notes (Signed)
Report given to 4W RN patient taken to 4W by NT viva Wheelchair

## 2019-04-19 NOTE — H&P (Signed)
Physical Medicine and Rehabilitation Admission H&P        Chief Complaint  Patient presents with  . Code Stroke  : HPI: Brooke Collier is a 84 year old right-handed female with history of hypertension, anxiety, chronic interstitial cystitis.  Patient lives alone independent prior to admission.  Two-level home with ramped entrance.  Patient presented 04/14/2019 with lower extremity weakness.  Blood pressure 170/79.  Admission chemistries unremarkable, urine drug screen negative, urinalysis negative nitrite, SARS coronavirus negative.  Cranial CT scan unremarkable for acute intracranial process.  Patient did not receive TPA.  MRI showed acute on chronic small vessel ischemia in the thalami.  An 8 mm right lateral thalamic internal capsule lacune is noted along with evidence of punctate contralateral left ventral thalamic infarction.  CT angiogram of head and neck showed generalized ectasia with mild atherosclerotic changes with 50% left supraclinoid ICA stenosis only.  Echocardiogram with ejection fraction of 65% without emboli.  Aspirin Plavix added for CVA prophylaxis x3 weeks then Plavix alone.  Subcutaneous Lovenox for DVT prophylaxis.  Maintain on a regular diet.  Therapy evaluations completed and patient was admitted for a comprehensive rehab program.  C/o burning pain at night in LUE   Review of Systems  Constitutional: Negative for chills and fever.  HENT: Negative for hearing loss.   Eyes: Negative for blurred vision and double vision.  Respiratory: Negative for cough and shortness of breath.   Cardiovascular: Negative for chest pain, palpitations and leg swelling.  Gastrointestinal: Positive for constipation. Negative for heartburn, nausea and vomiting.  Genitourinary: Positive for dysuria and urgency. Negative for flank pain and hematuria.       Interstitial cystitis  Musculoskeletal: Positive for joint pain and myalgias.  Neurological: Positive for weakness.    Psychiatric/Behavioral:       Anxiety  All other systems reviewed and are negative.       Past Medical History:  Diagnosis Date  . Allergy    . Chronic interstitial cystitis    . Hypertension           Past Surgical History:  Procedure Laterality Date  . ABDOMINAL HYSTERECTOMY      . INTRAMEDULLARY (IM) NAIL INTERTROCHANTERIC   05/04/2016  . INTRAMEDULLARY (IM) NAIL INTERTROCHANTERIC Left 05/04/2016    Procedure: INTRAMEDULLARY (IM) NAIL INTERTROCHANTRIC;  Surgeon: Mcarthur Rossetti, MD;  Location: [facility] ORS;  Service: Orthopedics;  Laterality: Left;  . tonsillectomyy             Family History  Problem Relation Age of Onset  . Heart disease Mother    . Heart disease Father    . Anxiety disorder Sister    . Heart disease Brother    . Anxiety disorder Brother      Social History:  reports that she has never smoked. She has never used smokeless tobacco. She reports that she does not drink alcohol or use drugs. Allergies:  Allergies  Allergen Reactions  . Ciprofloxacin        Muscle weakness  . Zoloft [Sertraline] Nausea And Vomiting          Medications Prior to Admission  Medication Sig Dispense Refill  . acetaminophen (TYLENOL) 325 MG tablet Take 2 tablets (650 mg total) by mouth every 6 (six) hours as needed (Pain, Hadache or Fever >/= 101).      [name] ALPRAZolam (XANAX) 0.25 MG tablet Take 0.125 mg by mouth 2 (two) times daily.      [name] amLODipine (NORVASC) 2.5 MG tablet Take  2.5 mg by mouth daily.      [name] aspirin EC 325 MG EC tablet Take 1 tablet (325 mg total) by mouth daily with breakfast. (Patient taking differently: Take 81 mg by mouth daily with breakfast. ) 30 tablet 0  . Calcium Carb-Cholecalciferol (CALCIUM-VITAMIN D) 500-200 MG-UNIT tablet Take 1 tablet by mouth daily.      . carboxymethylcellulose (REFRESH PLUS) 0.5 % SOLN Place 1 drop into both eyes daily as needed (dry eyes).      [name] Co-Enzyme Q-10 30 MG CAPS Take 30 mg by mouth daily.      . Magnesium 100 MG  TABS Take 100 mg by mouth daily.      . Misc Natural Products (CYSTEX URINARY HEALTH PO) Take 1 tablet by mouth in the morning and at bedtime.      [name] olmesartan (BENICAR) 40 MG tablet Take 20 mg by mouth 2 (two) times daily.       . Calcium Carbonate (CALCIUM 500 PO) Take by mouth daily.        . iron polysaccharides (NIFEREX) 150 MG capsule Take 1 capsule (150 mg total) by mouth daily. (Patient not taking: Reported on 04/14/2019)          Drug Regimen Review Drug regimen was reviewed and remains appropriate with no significant issues identified   Home: Home Living Family/patient expects to be discharged to:: Private residence Living Arrangements: Alone Available Help at Discharge: Family, Available 24 hours/day(son and dtr can work remote and provide 24/7 assist) Type of Home: [place] Access: [place]: Two level, [place] to live on main level with bedroom/bathroom Bathroom Shower/Tub: Multimedia programmer: Handicapped height Bathroom Accessibility: Yes Home Equipment: Environmental consultant - 2 wheels, [place] - single point, Civil engineer, contracting  Lives With: Alone   Functional History: Prior Function Level of Independence: Independent   Functional Status:  Mobility: Bed Mobility Overal bed mobility: Needs Assistance Bed Mobility: Supine to Sit Supine to sit: Min assist Sit to supine: Min assist General bed mobility comments: min assist to support lift on trunk but then is more stable in sitting Transfers Overall transfer level: Needs assistance Equipment used: Rolling walker (2 wheeled) Transfers: Sit to/from Stand Sit to Stand: Min assist General transfer comment: min from side of bed with walker Ambulation/Gait Ambulation/Gait assistance: Min assist Gait Distance (Feet): 15 Feet Assistive device: Rolling walker (2 wheeled), 1 person hand held assist Gait Pattern/deviations: Step-to pattern, Step-through pattern, Decreased stride length, Decreased weight shift to left,  Wide base of support General Gait Details: wide careful steps with cues during turns to keep shifting LLE Gait velocity: decreased   ADL: ADL Overall ADL's : Needs assistance/impaired Eating/Feeding: Set up, Sitting, Minimal assistance Eating/Feeding Details (indicate cue type and reason): intermittent steadying assist to seat in unsupported sitting position (EOB) Grooming: Minimal assistance, Sitting Upper Body Bathing: Minimal assistance, Sitting Lower Body Bathing: Moderate assistance, Sit to/from stand, +2 for safety/equipment Upper Body Dressing : Minimal assistance, Sitting Lower Body Dressing: Moderate assistance, +2 for safety/equipment, Sit to/from stand Toilet Transfer: Moderate assistance, Stand-pivot, Maximal assistance, BSC, RW Toileting- Clothing Manipulation and Hygiene: Sit to/from stand, Moderate assistance, +2 for safety/equipment General ADL Comments: Pt completed bed mobility, 2x sit<>stands, side stepped along EOB, and walked in place. 2 seated resting breaks incorporated. + lightheaded/swimmyheaded with positional chane but not objectively found to be orthostatic   Cognition: Cognition Overall Cognitive Status: Within Functional Limits for tasks assessed Orientation Level: Oriented X4 Cognition Arousal/Alertness: Awake/alert Behavior  During Therapy: WFL for tasks assessed/performed Overall Cognitive Status: Within Functional Limits for tasks assessed   Physical Exam: Blood pressure (!) 145/79, pulse (!) 57, temperature 97.7 F (36.5 C), temperature source Oral, resp. rate 16, height 5\' 4"  (1.626 m), weight 42.6 kg, SpO2 97 %. Physical Exam  Neurological:  Patient is alert and appears a bit anxious.  Makes eye contact with examiner.  Follows commands.  Oriented x3.  Fair awareness of deficits.    General: No acute distress Mood and affect are appropriate Heart: Regular rate and rhythm no rubs murmurs or extra sounds Lungs: Clear to auscultation, breathing  unlabored, no rales or wheezes Abdomen: Positive bowel sounds, soft nontender to palpation, nondistended Extremities: No clubbing, cyanosis, or edema Skin: No evidence of breakdown, no evidence of rash Neurologic: Cranial nerves II through XII intact, motor strength is 4/5 in bilateral deltoid, bicep, tricep, grip, hip flexor, knee extensors, ankle dorsiflexor and plantar flexor Sensory exam normal sensation to light touch  in bilateral upper and lower extremities Cerebellar exam normal finger to nose to finger as well as heel to shin in Right  upper and lower extremities, mild dysmetria LUE Musculoskeletal: Full range of motion in all 4 extremities. No joint swelling   Lab Results Last 48 Hours  No results found for this or any previous visit (from the past 48 hour(s)).   Imaging Results (Last 48 hours)  No results found.           Medical Problem List and Plan: 1.  Lower extremity weakness secondary to bilateral thalamic lacunar infarct secondary to small vessel disease             -patient may shower             -ELOS/Goals: 12-16d , supervision to [place] A 2.  Antithrombotics: -DVT/anticoagulation: Lovenox             -antiplatelet therapy: Aspirin 81 mg daily, Plavix 75 mg daily x3 weeks then Plavix alone 3. Pain Management: Tylenol as needed, may need gabapentin if burning pain LUE at noc does not improve 4. Mood: BuSpar 5 mg twice daily, Xanax as needed took 0.5mg  1/2 tab qnoon and q 5pm at home             -antipsychotic agents: N/A 5. Neuropsych: This patient is capable of making decisions on her own behalf. 6. Skin/Wound Care: Routine skin checks 7. Fluids/Electrolytes/Nutrition: Routine in and outs with follow-up chemistries 8.  Hypertension. Norvasc 2.5 mg daily, Avapro 150 mg daily.  Monitor with increased mobility  9.  Hyperlipidemia.  Lipitor 10.  Chronic interstitial cystitis.  Latest urinalysis negative nitrite.    Lavon Paganini Angiulli, PA-C 04/19/2019  "I have  personally performed a face to face diagnostic evaluation of this patient.  Additionally, I have reviewed and concur with the physician assistant's documentation above." Charlett Blake M.D. [facility] FAAPM&R (Neuromuscular Med) Diplomate Am Board of Electrodiagnostic Med Fellow Am Board of Interventional Pain

## 2019-04-19 NOTE — Progress Notes (Signed)
FPTS Interim Progress Note  Spoke with Dr. Erlinda Hong, on-call physician for stroke team.  Discussed with him that Dr. Leonie Man had recommened DAPT with ASA 81 and plavix 75mg  for 3 weeks, then ASA alone, but wanted to clarify if this is correct as patient was on ASA 81mg  at home.  He reviewed Dr. Clydene Fake note from 3/5 and agreed that given she was on ASA 81mg  prior to the stroke, will do DAPT x 3 weeks, then continue plavix 75mg  alone.  Will ensure d/c summary is updated to note such.  Hamsini Verrilli, Bernita Raisin, DO 04/19/2019, 12:21 PM PGY-2, [place] Service pager [phone]

## 2019-04-19 NOTE — Progress Notes (Signed)
Brooke Gong, RN  Rehab Admission Coordinator  Physical Medicine and Rehabilitation  PMR Pre-admission  Signed  Date of Service:  04/16/2019  1:41 PM      Related encounter: ED to Hosp-Admission (Discharged) from 04/14/2019 in [place]        Show:Clear all [x] Manual[x] Template[x] Copied  Added by: [x] Brooke Gong, RN  [] Hover for details PMR Admission Coordinator Pre-Admission Assessment   Patient: Brooke Collier is an 84 y.o., female MRN: [id number] DOB: Sep 28, 1935 Height: 5\' 4"  (162.6 cm) Weight: 42.6 kg                                                                                                                                                  Insurance Information HMO: yes    PPO:      PCP:      IPA:      80/20:      OTHER:  PRIMARY: [employer]      Policy#: [id number]      Subscriber: pt CM Name: Dot    Phone#: [phone] ext [phone]     Fax#: [phone] Pre-Cert#: [id number] approved for 7 days with f/u with Lestine Box  At phone [phone] ext [phone] fax [phone]      Employer:  Benefits:  Phone #: [phone]     Name: 3/8 Eff. Date: 02/12/2019     Deduct: none      Out of Pocket Max: $3900      Life Max: none CIR: $295 co pay per day days 1 until 6      SNF: no copay days 1 until 20; $184 co pay per day days 21 until; 100 Outpatient: $10 to $40 per visit     Co-Pay: visits per medical neccesity Home Health: 100%      Co-Pay: visits per medical neccesity DME: 80%     Co-Pay: 20% Providers: in network    SECONDARY: none        Medicaid Application Date:       Case Manager:  Disability Application Date:       Case Worker:    The "Data Collection Information Summary" for patients in Inpatient Rehabilitation Facilities with attached "Privacy Act [place] Records" was provided and verbally reviewed with: Patient and Family   Emergency Contact Information         Contact Information    Name Relation Home Work [place], April Daughter     [phone]    BILLIE, VITACCO   [id number]           Current Medical History  Patient Admitting Diagnosis: CVA  History of Present Illness:  Brooke Collier is a 84 y.o. right-handed female with history of hypertension, anxiety, chronic interstitial cystitis.   Patient lives alone independent prior  to admission.    She presented on 05/11/2019 with lower extremity weakness.  Blood pressure 170/79.  Cranial CT unremarkable for acute intracranial process.  Patient did not receive TPA.  MRI showed acute on chronic small vessel ischemia in the thalami.  An 8 mm right lateral thalamic internal capsule lacune is noted along with evidence of punctate contralateral left ventral thalamic infarct.  CT angiogram of head and neck negative for large vessel occlusion.  Echocardiogram showed ejection fraction of 65% without emboli. Aspirin and Plavix for CVA prophylaxis.  Subcutaneous Lovenox for DVT prophylaxis.  Maintain on a regular consistency diet.     Complete NIHSS TOTAL: 2 Glasgow Coma Scale Score: 15   Past Medical History      Past Medical History:  Diagnosis Date  . Allergy    . Chronic interstitial cystitis    . Hypertension        Family History  family history includes Anxiety disorder in her brother and sister; Heart disease in her brother, father, and mother.   Prior Rehab/Hospitalizations:  Has the patient had prior rehab or hospitalizations prior to admission? Yes   Has the patient had major surgery during 100 days prior to admission? No   Current Medications    Current Facility-Administered Medications:  .   stroke: mapping our early stages of recovery book, , Does not apply, Once, Tammi Klippel, Sherin, DO .  acetaminophen (TYLENOL) tablet 650 mg, 650 mg, Oral, Q4H PRN, 650 mg at 04/17/19 2223 **OR** acetaminophen (TYLENOL) 160 MG/5ML solution 650 mg, 650 mg, Per Tube, Q4H PRN **OR** acetaminophen (TYLENOL)  suppository 650 mg, 650 mg, Rectal, Q4H PRN, Caroline More, DO .  ALPRAZolam Duanne Moron) tablet 0.125 mg, 0.125 mg, Oral, QHS PRN, Gladys Damme, MD, 0.125 mg at 04/18/19 2221 .  alum & mag hydroxide-simeth (MAALOX/MYLANTA) 200-200-20 MG/5ML suspension 30 mL, 30 mL, Oral, PRN, Dickie La, MD, 30 mL at 04/16/19 2031 .  amLODipine (NORVASC) tablet 2.5 mg, 2.5 mg, Oral, Daily, Patel, Poonam, MD .  aspirin EC tablet 81 mg, 81 mg, Oral, Q breakfast, Biby, Sharon L, NP, 81 mg at 04/19/19 0916 .  atorvastatin (LIPITOR) tablet 40 mg, 40 mg, Oral, q1800, Tammi Klippel, Sherin, DO, 40 mg at 04/18/19 1753 .  busPIRone (BUSPAR) tablet 5 mg, 5 mg, Oral, BID, Gladys Damme, MD, 5 mg at 04/19/19 0916 .  clopidogrel (PLAVIX) tablet 75 mg, 75 mg, Oral, Daily, Biby, Sharon L, NP, 75 mg at 04/19/19 0915 .  enoxaparin (LOVENOX) injection 30 mg, 30 mg, Subcutaneous, Q24H, Abraham, Sherin, DO, 30 mg at 04/18/19 1205 .  irbesartan (AVAPRO) tablet 150 mg, 150 mg, Oral, Daily, Meccariello, Bernita Raisin, DO, 150 mg at 04/19/19 0916 .  phenazopyridine (PYRIDIUM) tablet 100 mg, 100 mg, Oral, Daily PRN, Meccariello, Bernita Raisin, DO, 100 mg at 04/18/19 1920 .  polyvinyl alcohol (LIQUIFILM TEARS) 1.4 % ophthalmic solution 1 drop, 1 drop, Both Eyes, PRN, Gladys Damme, MD   Patients Current Diet:     Diet Order                      Diet regular Room service appropriate? Yes; Fluid consistency: Thin  Diet effective now                   Precautions / Restrictions Precautions Precautions: Fall Precaution Comments: standing instability Restrictions Weight Bearing Restrictions: No    Has the patient had 2 or more falls or a fall with injury in the past  year?No   Prior Activity Level  Independent with mobility and ADLS pta   Prior Functional Level Prior Function Level of Independence: Independent   Self Care: Did the patient need help bathing, dressing, using the toilet or eating?  Independent   Indoor Mobility:  Did the patient need assistance with walking from room to room (with or without device)? Independent   Stairs: Did the patient need assistance with internal or external stairs (with or without device)? Independent   Functional Cognition: Did the patient need help planning regular tasks such as shopping or remembering to take medications? Independent   Home Assistive Devices / Equipment Home Assistive Devices/Equipment: None Home Equipment: Environmental consultant - 2 wheels, Cane - single point, Civil engineer, contracting   Prior Device Use: Indicate devices/aids used by the patient prior to current illness, exacerbation or injury? None of the above   Current Functional Level Cognition   Overall Cognitive Status: Within Functional Limits for tasks assessed Orientation Level: Oriented X4    Extremity Assessment (includes Sensation/Coordination)   Upper Extremity Assessment: LUE deficits/detail LUE Deficits / Details: proprioceptive deficits noted today, some dysmetria,  LUE Sensation: decreased proprioception  Lower Extremity Assessment: Defer to PT evaluation LLE Deficits / Details: Increased weaknes noted in LLE, especially functionally. Pt with difficulty lifting LLE to take a step.      ADLs   Overall ADL's : Needs assistance/impaired Eating/Feeding: Set up, Sitting, Minimal assistance Eating/Feeding Details (indicate cue type and reason): intermittent steadying assist to seat in unsupported sitting position (EOB) Grooming: Minimal assistance, Sitting Upper Body Bathing: Minimal assistance, Sitting Lower Body Bathing: Moderate assistance, Sit to/from stand, +2 for safety/equipment Upper Body Dressing : Minimal assistance, Sitting Lower Body Dressing: Moderate assistance, +2 for safety/equipment, Sit to/from stand Toilet Transfer: Moderate assistance, Stand-pivot, Maximal assistance, BSC, RW Toileting- Clothing Manipulation and Hygiene: Sit to/from stand, Moderate assistance, +2 for safety/equipment General  ADL Comments: Pt completed bed mobility, 2x sit<>stands, side stepped along EOB, and walked in place. 2 seated resting breaks incorporated. + lightheaded/swimmyheaded with positional chane but not objectively found to be orthostatic     Mobility   Overal bed mobility: Needs Assistance Bed Mobility: Supine to Sit Supine to sit: Min assist Sit to supine: Min assist General bed mobility comments: min assist to support lift on trunk but then is more stable in sitting     Transfers   Overall transfer level: Needs assistance Equipment used: Rolling walker (2 wheeled) Transfers: Sit to/from Stand Sit to Stand: Min assist General transfer comment: min from side of bed with walker     Ambulation / Gait / Stairs / Wheelchair Mobility   Ambulation/Gait Ambulation/Gait assistance: Herbalist (Feet): 15 Feet Assistive device: Rolling walker (2 wheeled), 1 person hand held assist Gait Pattern/deviations: Step-to pattern, Step-through pattern, Decreased stride length, Decreased weight shift to left, Wide base of support General Gait Details: wide careful steps with cues during turns to keep shifting LLE Gait velocity: decreased     Posture / Balance Dynamic Sitting Balance Sitting balance - Comments: tendency to lean to the left, able to self correct  Balance Overall balance assessment: Needs assistance Sitting-balance support: Feet supported, Bilateral upper extremity supported Sitting balance-Leahy Scale: Fair Sitting balance - Comments: tendency to lean to the left, able to self correct  Postural control: Posterior lean Standing balance support: Bilateral upper extremity supported, During functional activity Standing balance-Leahy Scale: Poor Standing balance comment: Reliant on UE and external support; posterior lean initially  Special needs/care consideration BiPAP/CPAP CPM Continuous Drip IV Dialysis        Life Vest Oxygen Special Bed Trach Size Wound Vac  Skin                                Bowel mgmt:continent Bladder mgmt:continent Diabetic mgmt Behavioral consideration  Anxiety at baseline; Xanax pta. Very anxious about pandemic and private room recommended due to high anxiety level Chemo/radiation  Designated visitor is April, daughter      Previous Home Environment  Living Arrangements: Alone  Lives With: Alone Available Help at Discharge: Family, Available 24 hours/day(son and dtr can work remote and provide 24/7 assist) Type of Home: [place]: Two level, Able to live on main level with bedroom/bathroom Home Access: Ramped entrance Bathroom Shower/Tub: Multimedia programmer: Handicapped height [place] Accessibility: Yes How Accessible: Accessible via [place]: No   Discharge [place] for Discharge Living Setting: Patient's home, Alone Type of Home at Discharge: [place]: Two level, Able to live on main level with bedroom/bathroom Discharge Home Access: [place] entrance Discharge Bathroom Shower/Tub: Walk-in shower Discharge Bathroom Toilet: Handicapped height Discharge Bathroom Accessibility: Yes How Accessible: Accessible via walker Does the patient have any problems obtaining your medications?: No   Social/Family/Support Systems Patient Roles: Parent Contact Information: April, daughter Anticipated Caregiver: daughter and son  Anticipated Caregiver's Contact Information: [phone] Ability/Limitations of Caregiver: daughter and son teach and can work remote Careers adviser: 24/7 Discharge Plan Discussed with Primary Caregiver: Yes Is Caregiver In Agreement with Plan?: Yes Does Caregiver/Family have Issues with Lodging/Transportation while Pt is in Rehab?: No   Goals/Additional Needs Patient/Family Goal for Rehab: supervision ot min assist with PT and OT  Expected length of stay: [facility] 12 to 16 days Special Service Needs: very anxious at baseline  and more so with pandemic; was on Xanax pta Pt/Family Agrees to Admission and willing to participate: Yes Program Orientation Provided & Reviewed with Pt/Caregiver Including Roles  & Responsibilities: Yes   Decrease burden of Care through IP rehab admission:    Possible need for SNF placement upon discharge:   Patient Condition: This patient's medical and functional status has changed since the consult dated: 04/16/2019 in which the Rehabilitation Physician determined and documented that the patient's condition is appropriate for intensive rehabilitative care in an inpatient rehabilitation facility. See "History of Present Illness" (above) for medical update. Functional changes are: overall min to mod assist. Patient's medical and functional status update has been discussed with the Rehabilitation physician and patient remains appropriate for inpatient rehabilitation. Will admit to inpatient rehab today.   Preadmission Screen Completed By:  Cleatrice Burke, RN, 04/19/2019 11:29 AM ______________________________________________________________________   Discussed status with Dr. Letta Pate on 04/19/2019 at  58 and received approval for admission today.   Admission Coordinator:  Cleatrice Burke, time [id number] Date 04/19/2019          Cosigned by: Charlett Blake, MD at 04/19/2019 11:33 AM  Revision History

## 2019-04-19 NOTE — Progress Notes (Signed)
Physical Therapy Treatment Patient Details Name: Brooke Collier MRN: [id number] DOB: June 09, 1935 Today's Date: 04/19/2019    History of Present Illness Pt is an 84 y/o female admitted secondary to weakness in LEs (LLE>RLE). 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 PMH includes HTN, and interstitial cystitis.     PT Comments    Pt admitted with above diagnosis. Pt was able to ambulate with RW with min assist and cues.  Pt still needs cues for sequencing steps and RW but is able to advance the left LE now but has very uncoordinated movement.  Pt supposed to go to Rehab today.  Pt currently with functional limitations due to balance and endurance deficits. Pt will benefit from skilled PT to increase their independence and safety with mobility to allow discharge to the venue listed below.     Follow Up Recommendations  CIR     Equipment Recommendations  None recommended by PT    Recommendations for Other Services       Precautions / Restrictions Precautions Precautions: Fall Precaution Comments: standing instability Restrictions Weight Bearing Restrictions: No    Mobility  Bed Mobility Overal bed mobility: Needs Assistance Bed Mobility: Sit to Supine     Supine to sit: Min guard;HOB elevated Sit to supine: Supervision   General bed mobility comments: Pt was in the chair on arrival. Pt did get back in bed at end of treatment.  Shedid not need asssist for LEs or trunk.   Transfers Overall transfer level: Needs assistance Equipment used: Rolling walker (2 wheeled) Transfers: Sit to/from [facility] Sit to Stand: Min assist Stand pivot transfers: Min assist;Mod assist       General transfer comment: boosting and steadying assist at [place], pt continues to have difficulty advancing LLE with taking steps  Ambulation/Gait Ambulation/Gait assistance: Min assist Gait Distance (Feet): 65 Feet Assistive device: Rolling walker (2 wheeled);1 person hand held  assist Gait Pattern/deviations: Step-to pattern;Step-through pattern;Decreased stride length;Decreased weight shift to left;Wide base of support Gait velocity: decreased Gait velocity interpretation: 1.31 - 2.62 ft/sec, indicative of limited community ambulator General Gait Details: wide careful steps with cues during turns to keep shifting LLE. Pt needed cues for sequencing steps and RW.  Can advance left LE but struggles with coordination of left LE and placement.  Needed assist to steer RW as well.    Stairs             Wheelchair Mobility    Modified Rankin (Stroke Patients Only) Modified Rankin (Stroke Patients Only) Pre-Morbid Rankin Score: No symptoms Modified Rankin: Moderate disability     Balance Overall balance assessment: Needs assistance Sitting-balance support: Feet supported;Bilateral upper extremity supported Sitting balance-Leahy Scale: Fair Sitting balance - Comments: tendency to lean to the left, able to self correct  Postural control: Posterior lean;Left lateral lean Standing balance support: Bilateral upper extremity supported;During functional activity Standing balance-Leahy Scale: Poor Standing balance comment: Reliant on UE and external support; posterior lean initially                            Cognition Arousal/Alertness: Awake/alert Behavior During Therapy: WFL for tasks assessed/performed;Anxious Overall Cognitive Status: No family/caregiver present to determine baseline cognitive functioning                                 General Comments: word finding difficulties  Exercises General Exercises - Upper Extremity Shoulder Flexion: AROM;Left;10 reps;Seated Digit Composite Flexion: AROM;Left;10 reps;Seated Composite Extension: AROM;Left;10 reps;Seated General Exercises - Lower Extremity Ankle Circles/Pumps: AROM;Both;5 reps Long Arc Quad: AAROM;Strengthening;10 reps Hip Flexion/Marching: AROM;AAROM;10  reps Other Exercises Other Exercises: LUE digit opposition, x5 reps    General Comments General comments (skin integrity, edema, etc.): VSS      Pertinent Vitals/Pain Pain Assessment: Faces Faces Pain Scale: Hurts a little bit Pain Location: generalized, bil UEs Pain Descriptors / Indicators: Discomfort;Sore Pain Intervention(s): Limited activity within patient's tolerance;Monitored during session;Repositioned    Home Living                      Prior Function            PT Goals (current goals can now be found in the care plan section) Acute Rehab PT Goals Patient Stated Goal: get to rehab soon Progress towards PT goals: Progressing toward goals    Frequency    Min 4X/week      PT Plan Current plan remains appropriate    Co-evaluation              AM-PAC PT "6 Clicks" Mobility   Outcome Measure  Help needed turning from your back to your side while in a flat bed without using bedrails?: None Help needed moving from lying on your back to sitting on the side of a flat bed without using bedrails?: A Little Help needed moving to and from a bed to a chair (including a wheelchair)?: A Little Help needed standing up from a chair using your arms (e.g., wheelchair or bedside chair)?: A Little Help needed to walk in hospital room?: A Little Help needed climbing 3-5 steps with a railing? : Total 6 Click Score: 17    End of Session Equipment Utilized During Treatment: Gait belt Activity Tolerance: Patient limited by fatigue Patient left: with call bell/phone within reach;in bed;with bed alarm set Nurse Communication: Mobility status PT Visit Diagnosis: Unsteadiness on feet (R26.81);Muscle weakness (generalized) (M62.81)     Time: [id number] PT Time Calculation (min) (ACUTE ONLY): 15 min  Charges:  $Gait Training: 8-22 mins                     Jacklynn Dehaas W,PT [place] Pager:  [phone]  Office:  [place] 04/19/2019, 3:35 PM

## 2019-04-19 NOTE — Progress Notes (Signed)
Jamse Arn, MD  Physician  Physical Medicine and Rehabilitation  Consult Note  Signed  Date of Service:  04/16/2019  5:25 AM      Related encounter: ED to Hosp-Admission (Discharged) from 04/14/2019 in [facility]   Show:Clear all [x] Manual[x] Template[] Copied  Added by: [x] Angiulli, Lavon Paganini, PA-C[x] Jamse Arn, MD  [] Hover for details          Physical Medicine and Rehabilitation Consult Reason for Consult: Bilateral lower extremity weakness Referring Physician: [facility]     HPI: Brooke Collier is a 84 y.o. right-handed female with history of hypertension, anxiety, chronic interstitial cystitis.  History taken from chart review and patient.  Patient lives alone independent prior to admission.  Two-level home with ramped entrance.  She presented on 05/11/2019 with lower extremity weakness.  Blood pressure 170/79.  Cranial CT unremarkable for acute intracranial process.  Patient did not receive TPA.  MRI showed acute on chronic small vessel ischemia in the thalami.  An 8 mm right lateral thalamic internal capsule lacune is noted along with evidence of punctate contralateral left ventral thalamic infarct.  CT angiogram of head and neck negative for large vessel occlusion.  Echocardiogram showed ejection fraction of 65% without emboli. Aspirin and Plavix for CVA prophylaxis.  Subcutaneous Lovenox for DVT prophylaxis.  Maintain on a regular consistency diet.  Therapy evaluations completed with recommendations of physical medicine rehab consult.   Review of Systems  Constitutional: Negative for chills and fever.  HENT: Negative for hearing loss.   Eyes: Negative for blurred vision and double vision.  Respiratory: Negative for cough and shortness of breath.   Cardiovascular: Negative for chest pain, palpitations and leg swelling.  Gastrointestinal: Positive for constipation. Negative for heartburn, nausea and  vomiting.  Genitourinary: Negative for dysuria, flank pain and hematuria.  Musculoskeletal: Positive for myalgias.  Skin: Negative for rash.  Neurological: Positive for sensory change. Negative for weakness.  Psychiatric/Behavioral: The patient has insomnia.        Anxiety        Past Medical History:  Diagnosis Date  . Allergy    . Chronic interstitial cystitis    . Hypertension           Past Surgical History:  Procedure Laterality Date  . ABDOMINAL HYSTERECTOMY      . INTRAMEDULLARY (IM) NAIL INTERTROCHANTERIC   05/04/2016  . INTRAMEDULLARY (IM) NAIL INTERTROCHANTERIC Left 05/04/2016    Procedure: INTRAMEDULLARY (IM) NAIL INTERTROCHANTRIC;  Surgeon: Mcarthur Rossetti, MD;  Location: [facility];  Service: Orthopedics;  Laterality: Left;  . tonsillectomyy             Family History  Problem Relation Age of Onset  . Heart disease Mother    . Heart disease Father    . Anxiety disorder Sister    . Heart disease Brother    . Anxiety disorder Brother      Social History:  reports that she has never smoked. She has never used smokeless tobacco. She reports that she does not drink alcohol or use drugs. Allergies:       Allergies  Allergen Reactions  . Ciprofloxacin        Muscle weakness  . Zoloft [Sertraline] Nausea And Vomiting          Medications Prior to Admission  Medication Sig Dispense Refill  . acetaminophen (TYLENOL) 325 MG tablet Take 2 tablets (650 mg  total) by mouth every 6 (six) hours as needed (Pain, Hadache or Fever >/= 101).      [name] ALPRAZolam (XANAX) 0.25 MG tablet Take 0.125 mg by mouth 2 (two) times daily.      [name] amLODipine (NORVASC) 2.5 MG tablet Take 2.5 mg by mouth daily.      [name] aspirin EC 325 MG EC tablet Take 1 tablet (325 mg total) by mouth daily with breakfast. (Patient taking differently: Take 81 mg by mouth daily with breakfast. ) 30 tablet 0  . Calcium Carb-Cholecalciferol (CALCIUM-VITAMIN D) 500-200 MG-UNIT tablet Take 1 tablet by mouth daily.       . carboxymethylcellulose (REFRESH PLUS) 0.5 % SOLN Place 1 drop into both eyes daily as needed (dry eyes).      [name] Co-Enzyme Q-10 30 MG CAPS Take 30 mg by mouth daily.      . Magnesium 100 MG TABS Take 100 mg by mouth daily.      . Misc Natural Products (CYSTEX URINARY HEALTH PO) Take 1 tablet by mouth in the morning and at bedtime.      [name] olmesartan (BENICAR) 40 MG tablet Take 20 mg by mouth 2 (two) times daily.       . Calcium Carbonate (CALCIUM 500 PO) Take by mouth daily.        . iron polysaccharides (NIFEREX) 150 MG capsule Take 1 capsule (150 mg total) by mouth daily. (Patient not taking: Reported on 04/14/2019)          Home: Home Living Family/patient expects to be discharged to:: Private residence Living Arrangements: Alone Available Help at Discharge: Family, Available PRN/intermittently Type of Home: House Home Access: [place]: Two level, Able to live on main level with bedroom/bathroom Bathroom Shower/Tub: Multimedia programmer: Handicapped height [place]: Environmental consultant - 2 wheels, [place] - single point, Civil engineer, contracting  Lives With: Alone  Functional History: Prior Function Level of Independence: Independent Functional Status:  Mobility: Bed Mobility Overal bed mobility: Needs Assistance Bed Mobility: Supine to Sit, Sit to Supine Supine to sit: Min assist Sit to supine: Min assist General bed mobility comments: Min A for trunk and LE assist.  Transfers Overall transfer level: Needs assistance Equipment used: Rolling walker (2 wheeled) Transfers: Sit to/from Stand Sit to Stand: Mod assist General transfer comment: Mod A to static stand from EOB with RW,  left side weakness noted. Sit to stand x 3. Pt stood ~60 seconds each trial. Pt reports dizziness in sitting and standing, see orthostatic vitals. Ambulation/Gait Ambulation/Gait assistance: +2 physical assistance, +2 safety/equipment, Mod assist Gait Distance (Feet): 3 Feet Assistive  device: Rolling walker (2 wheeled) Gait Pattern/deviations: Step-to pattern, Narrow base of support General Gait Details: 3 side steps to L at edge of bed with RW and blocking of L knee 2* buckling; also performed marching in standing with RW, incoordination of LLE noted but pt able to clear L foot from floor, pt reports B feet feel numb Gait velocity: decr   ADL: ADL Overall ADL's : Needs assistance/impaired Eating/Feeding: Set up, Sitting, Minimal assistance Eating/Feeding Details (indicate cue type and reason): intermittent steadying assist to seat in unsupported sitting position (EOB) Grooming: Minimal assistance, Sitting Upper Body Bathing: Minimal assistance, Sitting Lower Body Bathing: Moderate assistance, Sit to/from stand, +2 for safety/equipment Upper Body Dressing : Minimal assistance, Sitting Lower Body Dressing: Moderate assistance, +2 for safety/equipment, Sit to/from stand Toilet Transfer: Moderate assistance, Stand-pivot, Maximal assistance, BSC, RW Toileting- Clothing Manipulation and Hygiene: Sit  to/from stand, Moderate assistance, +2 for safety/equipment General ADL Comments: Pt completed bed mobility, 2x sit<>stands, side stepped along EOB, and walked in place. 2 seated resting breaks incorporated. + lightheaded/swimmyheaded with positional chane but not objectively found to be orthostatic   Cognition: Cognition Overall Cognitive Status: Within Functional Limits for tasks assessed Orientation Level: Oriented X4 Cognition Arousal/Alertness: Awake/alert Behavior During Therapy: WFL for tasks assessed/performed Overall Cognitive Status: Within Functional Limits for tasks assessed   Blood pressure 134/72, pulse (!) 55, temperature 97.8 F (36.6 C), temperature source Oral, resp. rate 18, height 5\' 4"  (1.626 m), weight 42.6 kg, SpO2 98 %. Physical Exam  Vitals reviewed. Constitutional: She appears well-developed.  Frail  HENT:  Head: Normocephalic and atraumatic.    Eyes: EOM are normal. Right eye exhibits no discharge. Left eye exhibits no discharge.  Neck: No tracheal deviation present. No thyromegaly present.  Respiratory: Effort normal. No respiratory distress.  GI: Soft. She exhibits no distension.  Musculoskeletal:     Comments: No edema or tenderness in extremities  Neurological: She is alert.  Patient is alert and oriented x3.   Follows commands. Motor: 4+/5 throughout Left upper extremity ataxia. Sensation diminished to light touch left plantar foot  Skin: Skin is warm and dry.  Psychiatric: She has a normal mood and affect. Her behavior is normal.      Lab Results Last 24 Hours       Results for orders placed or performed during the hospital encounter of 04/14/19 (from the past 24 hour(s))  CBC     Status: None    Collection Time: 04/16/19  6:02 AM  Result Value Ref Range    WBC 4.3 4.0 - 10.5 K/uL    RBC 4.06 3.87 - 5.11 MIL/uL    Hemoglobin 12.6 12.0 - 15.0 g/dL    HCT 37.2 36.0 - 46.0 %    MCV 91.6 80.0 - 100.0 fL    MCH 31.0 26.0 - 34.0 pg    MCHC 33.9 30.0 - 36.0 g/dL    RDW 13.6 11.5 - 15.5 %    Platelets 198 150 - 400 K/uL    nRBC 0.0 0.0 - 0.2 %  Basic metabolic panel     Status: Abnormal    Collection Time: 04/16/19  6:02 AM  Result Value Ref Range    Sodium 141 135 - 145 mmol/L    Potassium 3.8 3.5 - 5.1 mmol/L    Chloride 108 98 - 111 mmol/L    CO2 26 22 - 32 mmol/L    Glucose, Bld 91 70 - 99 mg/dL    BUN 16 8 - 23 mg/dL    Creatinine, Ser 0.91 0.44 - 1.00 mg/dL    Calcium 8.7 (L) 8.9 - 10.3 mg/dL    GFR calc non Af Amer 59 (L) >60 mL/min    GFR calc Af Amer >60 >60 mL/min    Anion gap 7 5 - 15       Imaging Results (Last 48 hours)  CT ANGIO HEAD W OR WO CONTRAST   Result Date: 04/15/2019 CLINICAL DATA:  84 year old female code stroke presentation yesterday found to have small right greater than left acute on chronic thalamic lacunar infarcts on MRI. EXAM: CT ANGIOGRAPHY HEAD AND NECK TECHNIQUE:  Multidetector CT imaging of the head and neck was performed using the standard protocol during bolus administration of intravenous contrast. Multiplanar CT image reconstructions and MIPs were obtained to evaluate the vascular anatomy. Carotid stenosis measurements (when applicable) are  obtained utilizing NASCET criteria, using the distal internal carotid diameter as the denominator. CONTRAST:  178mL OMNIPAQUE IOHEXOL 350 MG/ML SOLN COMPARISON:  Plain head CT and brain MRI yesterday. FINDINGS: CT HEAD Brain: No acute intracranial hemorrhage identified. No midline shift, mass effect, or evidence of intracranial mass lesion. No ventriculomegaly. Confluent bilateral cerebral white matter hypodensity as well as heterogeneity in both thalami. Increased hypodensity at the right lateral thalamus since yesterday. No superimposed acute cortically based infarct identified. Calvarium and skull base: Osteopenia. No acute osseous abnormality identified. Paranasal sinuses: Visualized paranasal sinuses and mastoids are stable and well pneumatized. Orbits: No acute orbit or scalp soft tissue finding. CTA NECK Skeleton: Cervical spine degeneration. Osteopenia. No acute osseous abnormality identified. Upper chest: Negative. Other neck: Negative. Aortic arch: The entire arch is not included. No aortic atherosclerosis is evident. Right carotid system: Distal brachiocephalic artery is normal. Normal right CCA origin. Minimal calcified plaque at the right carotid bifurcation mostly affects the ECA. Tortuous right ICA without stenosis. Left carotid system: Visible proximal left CCA is normal. Calcified plaque at the left ICA origin does not result in stenosis. Negative left ICA otherwise to the skull base. Vertebral arteries: Calcified plaque in the proximal right subclavian artery without significant stenosis. Normal right vertebral artery origin. Dominant and mildly ectatic right vertebral artery is widely patent to the skull base.  Visible proximal left subclavian artery and left vertebral artery origin are normal. Non dominant left vertebral artery is patent to the skull base without plaque or stenosis. CTA HEAD Posterior circulation: There is mild calcified plaque in the dominant right V4 segment without stenosis. The left vertebral functionally terminates in PICA. The right AICA appears dominant. Patent basilar artery without stenosis. Normal SCA and right PCA origins. Fetal type left PCA origin. The right posterior communicating artery is diminutive or absent. There is mild irregularity of the bilateral P1 and P2 segments. Otherwise PCA branches are within normal limits. Anterior circulation: Both ICA siphons are patent. On the left there is supraclinoid calcified plaque resulting in about 50% stenosis (series 12, image 94). Normal left posterior communicating artery origin. On the right there is cavernous and supraclinoid calcified plaque with only mild stenosis. Patent carotid termini. Normal MCA and ACA origins. Tortuous A1 segments. Anterior communicating artery and bilateral ACA branches are within normal limits. Left MCA M1 segment and bifurcation are patent without stenosis. Left MCA branches are within normal limits. Right MCA M1 segment and bifurcation are patent without stenosis. Right MCA branches are normal aside from mild posterior M2 branch irregularity on series 16, image 13. Venous sinuses: Early contrast timing, not well evaluated. Anatomic variants: Dominant right vertebral artery, the left functionally terminates in PICA. Fetal type left PCA origin. Review of the MIP images confirms the above findings IMPRESSION: 1. Negative for large vessel occlusion. 2. Generalized arterial ectasia with mild for age atherosclerosis in the head and neck. Calcified plaque results in 50% stenosis of the supraclinoid Left ICA siphon. 3. Expected CT appearance of the right thalamic lacunar infarct. No mass effect, hemorrhage, or new  intracranial abnormality. Electronically Signed   By: Genevie Ann M.D.   On: 04/15/2019 12:52    CT ANGIO NECK W OR WO CONTRAST   Result Date: 04/15/2019 CLINICAL DATA:  85 year old female code stroke presentation yesterday found to have small right greater than left acute on chronic thalamic lacunar infarcts on MRI. EXAM: CT ANGIOGRAPHY HEAD AND NECK TECHNIQUE: Multidetector CT imaging of the head and neck was performed  using the standard protocol during bolus administration of intravenous contrast. Multiplanar CT image reconstructions and MIPs were obtained to evaluate the vascular anatomy. Carotid stenosis measurements (when applicable) are obtained utilizing NASCET criteria, using the distal internal carotid diameter as the denominator. CONTRAST:  151mL OMNIPAQUE IOHEXOL 350 MG/ML SOLN COMPARISON:  Plain head CT and brain MRI yesterday. FINDINGS: CT HEAD Brain: No acute intracranial hemorrhage identified. No midline shift, mass effect, or evidence of intracranial mass lesion. No ventriculomegaly. Confluent bilateral cerebral white matter hypodensity as well as heterogeneity in both thalami. Increased hypodensity at the right lateral thalamus since yesterday. No superimposed acute cortically based infarct identified. Calvarium and skull base: Osteopenia. No acute osseous abnormality identified. Paranasal sinuses: Visualized paranasal sinuses and mastoids are stable and well pneumatized. Orbits: No acute orbit or scalp soft tissue finding. CTA NECK Skeleton: Cervical spine degeneration. Osteopenia. No acute osseous abnormality identified. Upper chest: Negative. Other neck: Negative. Aortic arch: The entire arch is not included. No aortic atherosclerosis is evident. Right carotid system: Distal brachiocephalic artery is normal. Normal right CCA origin. Minimal calcified plaque at the right carotid bifurcation mostly affects the ECA. Tortuous right ICA without stenosis. Left carotid system: Visible proximal left CCA  is normal. Calcified plaque at the left ICA origin does not result in stenosis. Negative left ICA otherwise to the skull base. Vertebral arteries: Calcified plaque in the proximal right subclavian artery without significant stenosis. Normal right vertebral artery origin. Dominant and mildly ectatic right vertebral artery is widely patent to the skull base. Visible proximal left subclavian artery and left vertebral artery origin are normal. Non dominant left vertebral artery is patent to the skull base without plaque or stenosis. CTA HEAD Posterior circulation: There is mild calcified plaque in the dominant right V4 segment without stenosis. The left vertebral functionally terminates in PICA. The right AICA appears dominant. Patent basilar artery without stenosis. Normal SCA and right PCA origins. Fetal type left PCA origin. The right posterior communicating artery is diminutive or absent. There is mild irregularity of the bilateral P1 and P2 segments. Otherwise PCA branches are within normal limits. Anterior circulation: Both ICA siphons are patent. On the left there is supraclinoid calcified plaque resulting in about 50% stenosis (series 12, image 94). Normal left posterior communicating artery origin. On the right there is cavernous and supraclinoid calcified plaque with only mild stenosis. Patent carotid termini. Normal MCA and ACA origins. Tortuous A1 segments. Anterior communicating artery and bilateral ACA branches are within normal limits. Left MCA M1 segment and bifurcation are patent without stenosis. Left MCA branches are within normal limits. Right MCA M1 segment and bifurcation are patent without stenosis. Right MCA branches are normal aside from mild posterior M2 branch irregularity on series 16, image 13. Venous sinuses: Early contrast timing, not well evaluated. Anatomic variants: Dominant right vertebral artery, the left functionally terminates in PICA. Fetal type left PCA origin. Review of the MIP  images confirms the above findings IMPRESSION: 1. Negative for large vessel occlusion. 2. Generalized arterial ectasia with mild for age atherosclerosis in the head and neck. Calcified plaque results in 50% stenosis of the supraclinoid Left ICA siphon. 3. Expected CT appearance of the right thalamic lacunar infarct. No mass effect, hemorrhage, or new intracranial abnormality. Electronically Signed   By: Genevie Ann M.D.   On: 04/15/2019 12:52    MR BRAIN WO CONTRAST   Result Date: 04/14/2019 CLINICAL DATA:  84 year old female code stroke, left-sided weakness. EXAM: MRI HEAD WITHOUT CONTRAST TECHNIQUE: Multiplanar,  multiecho pulse sequences of the brain and surrounding structures were obtained without intravenous contrast. COMPARISON:  Plain head CT earlier today. FINDINGS: Brain: There is a small 8 mm focus of restricted diffusion in the dorsal right deep gray nuclei, lateral thalamus near the posterior limb of the right internal capsule (series 5, image 69). No significant T2 or FLAIR hyperintensity at this time. Questionable punctate contralateral anterior left thalamic focus of restricted diffusion (series 7, image 54). No evidence of associated acute hemorrhage.  No mass effect. Underlying bilateral thalamic and basal ganglia T2 and FLAIR heterogeneity. Scattered chronic microhemorrhages, including in both occipital lobes, left perirolandic cortex on series 14, image 37. Confluent bilateral cerebral white matter T2 and FLAIR hyperintensity. No cortical encephalomalacia identified. No other restricted diffusion. No midline shift, mass effect, evidence of mass lesion, ventriculomegaly, extra-axial collection or acute intracranial hemorrhage. Cervicomedullary junction and pituitary are within normal limits. Mild T2 heterogeneity in the pons. Vascular: Major intracranial vascular flow voids are preserved. The right vertebral artery is dominant. Skull and upper cervical spine: Negative for age visible cervical spine.  Normal bone marrow signal. Sinuses/Orbits: Postoperative changes to the left globe. Paranasal sinuses and mastoids are stable and well pneumatized. Other: Grossly normal internal auditory structures. Scalp and face soft tissues appear negative. IMPRESSION: 1. Acute on chronic small vessel ischemia in the thalami: An 8 mm right lateral thalamic/internal capsule lacune is noted along with evidence of a punctate contralateral left ventral thalamic infarct. 2. No associated acute hemorrhage or mass effect. Occasional chronic micro hemorrhages in conjunction with underlying chronic small vessel disease. Electronically Signed   By: Genevie Ann M.D.   On: 04/14/2019 12:58    DG CHEST PORT 1 VIEW   Result Date: 04/14/2019 CLINICAL DATA:  Headache and left-sided numbness. EXAM: PORTABLE CHEST 1 VIEW COMPARISON:  Chest x-ray 05/04/2016 FINDINGS: The heart is within normal limits in size given the AP projection and portable technique. There is mild tortuosity and calcification of the thoracic aorta. Stable hyperinflation and emphysematous changes but no acute overlying pulmonary process or worrisome pulmonary lesions. No pleural effusion. The bony thorax is intact. IMPRESSION: Stable emphysematous changes and hyperinflation but no acute overlying pulmonary process. Electronically Signed   By: Marijo Sanes M.D.   On: 04/14/2019 14:11    ECHOCARDIOGRAM COMPLETE   Result Date: 04/15/2019    ECHOCARDIOGRAM REPORT   Patient Name:   KAZZANDRA BRUNTY Date of Exam: 04/15/2019 Medical Rec #:  [id number]           Height:       64.0 in Accession #:    [id number]          Weight:       93.9 lb Date of Birth:  03-26-1935            BSA:          1.419 m Patient Age:    71 years            BP:           146/71 mmHg Patient Gender: F                   HR:           74 bpm. Exam Location:  Inpatient Procedure: 2D Echo, Cardiac Doppler and Color Doppler Indications:    Stroke  History:        Patient has no prior history of Echocardiogram  examinations.  Abnormal ECG, Stroke; Risk Factors:Dyslipidemia and                 Hypertension.  Sonographer:    Roseanna Rainbow RDCS Referring Phys: [place]  1. Left ventricular ejection fraction, by estimation, is 60 to 65%. The left ventricle has normal function. The left ventricle has no regional wall motion abnormalities. Left ventricular diastolic parameters are consistent with Grade I diastolic dysfunction (impaired relaxation).  2. Right ventricular systolic function is normal. The right ventricular size is normal. There is normal pulmonary artery systolic pressure. The estimated right ventricular systolic pressure is [id number] mmHg.  3. The mitral valve is normal in structure and function. No evidence of mitral valve regurgitation. No evidence of mitral stenosis.  4. The aortic valve is normal in structure and function. Aortic valve regurgitation is mild. Mild aortic valve sclerosis is present, with no evidence of aortic valve stenosis.  5. Severe atherosclerotic plaque is seen in the abdominal aorta. The aortic arch could not be well visualized.  6. The inferior vena cava is normal in size with greater than 50% respiratory variability, suggesting right atrial pressure of 3 mmHg. FINDINGS  Left Ventricle: Left ventricular ejection fraction, by estimation, is 60 to 65%. The left ventricle has normal function. The left ventricle has no regional wall motion abnormalities. The left ventricular internal cavity size was normal in size. There is  no left ventricular hypertrophy. Left ventricular diastolic parameters are consistent with Grade I diastolic dysfunction (impaired relaxation). Normal left ventricular filling pressure. Right Ventricle: The right ventricular size is normal. No increase in right ventricular wall thickness. Right ventricular systolic function is normal. There is normal pulmonary artery systolic pressure. The tricuspid regurgitant velocity is 2.40 m/s, and  with  an assumed right atrial pressure of 3 mmHg, the estimated right ventricular systolic pressure is [id number] mmHg. Left Atrium: Left atrial size was normal in size. Right Atrium: Right atrial size was normal in size. Pericardium: There is no evidence of pericardial effusion. Mitral Valve: The mitral valve is normal in structure and function. Normal mobility of the mitral valve leaflets. No evidence of mitral valve regurgitation. No evidence of mitral valve stenosis. Tricuspid Valve: The tricuspid valve is normal in structure. Tricuspid valve regurgitation is mild . No evidence of tricuspid stenosis. Aortic Valve: The aortic valve is normal in structure and function. Aortic valve regurgitation is mild. Aortic regurgitation PHT measures 530 msec. Mild aortic valve sclerosis is present, with no evidence of aortic valve stenosis. Pulmonic Valve: The pulmonic valve was normal in structure. Pulmonic valve regurgitation is not visualized. No evidence of pulmonic stenosis. Aorta: Severe atherosclerotic plaque is seen in the abdominal aorta. The aortic arch could not be well visualized. The aortic root is normal in size and structure. Venous: The inferior vena cava is normal in size with greater than 50% respiratory variability, suggesting right atrial pressure of 3 mmHg. IAS/Shunts: No atrial level shunt detected by color flow Doppler.  LEFT VENTRICLE PLAX 2D LVIDd:         3.70 cm     Diastology LVIDs:         2.50 cm     LV e' lateral:   9.79 cm/s LV PW:         1.10 cm     LV E/e' lateral: 7.1 LV IVS:        1.40 cm     LV e' medial:    5.87 cm/s LVOT diam:  1.90 cm     LV E/e' medial:  11.9 LV SV:         56 LV SV Index:   40 LVOT Area:     2.84 cm  LV Volumes (MOD) LV vol d, MOD A2C: 38.9 ml LV vol d, MOD A4C: 57.0 ml LV vol s, MOD A2C: 17.2 ml LV vol s, MOD A4C: 26.7 ml LV SV MOD A2C:     21.7 ml LV SV MOD A4C:     57.0 ml LV SV MOD BP:      27.4 ml RIGHT VENTRICLE             IVC RV S prime:     15.90 cm/s  IVC diam:  1.40 cm TAPSE (M-mode): 1.4 cm LEFT ATRIUM             Index       RIGHT ATRIUM           Index LA diam:        2.40 cm 1.69 cm/m  RA Area:     11.20 cm LA Vol (A2C):   40.0 ml 28.19 ml/m RA Volume:   26.90 ml  18.96 ml/m LA Vol (A4C):   36.8 ml 25.94 ml/m LA Biplane Vol: 42.2 ml 29.74 ml/m  AORTIC VALVE LVOT Vmax:   106.00 cm/s LVOT Vmean:  71.800 cm/s LVOT VTI:    0.199 m AI PHT:      530 msec  AORTA Ao Root diam: 2.90 cm Ao Asc diam:  3.10 cm MITRAL VALVE               TRICUSPID VALVE MV Area (PHT): 2.39 cm    TR Peak grad:   23.0 mmHg MV Decel Time: 317 msec    TR Vmax:        240.00 cm/s MV E velocity: 69.80 cm/s MV A velocity: 89.20 cm/s  SHUNTS MV E/A ratio:  0.78        Systemic VTI:  0.20 m                            Systemic Diam: 1.90 cm Mihai Croitoru MD Electronically signed by Sanda Klein MD Signature Date/Time: 04/15/2019/1:24:50 PM    Final        Assessment/Plan: Diagnosis: Bilateral thalamic infarcts Stroke: Continue secondary stroke prophylaxis and Risk Factor Modification listed below:   Antiplatelet therapy:   Blood Pressure Management:  Continue current medication with prn's with permisive HTN per primary team Statin Agent:   Labs independently reviewed.  Records reviewed and summated above.   1. Does the need for close, 24 hr/day medical supervision in concert with the patient's rehab needs make it unreasonable for this patient to be served in a less intensive setting? Yes 2. Co-Morbidities requiring supervision/potential complications: HTN (monitor and provide prns in accordance with increased physical exertion and pain), anxiety (ensure anxiety and resulting apprehension do not limit functional progress; consider prn medications if warranted), chronic interstitial cystitis 3. Due to safety, disease management and patient education, does the patient require 24 hr/day rehab nursing? Yes 4. Does the patient require coordinated care of a physician, rehab nurse, therapy  disciplines of PT/OT to address physical and functional deficits in the context of the above medical diagnosis(es)? Yes Addressing deficits in the following areas: balance, endurance, locomotion, transferring, bathing, dressing, toileting and psychosocial support 5. Can the patient actively participate in an intensive therapy program  of at least 3 hrs of therapy per day at least 5 days per week? Yes 6. The potential for patient to make measurable gains while on inpatient rehab is excellent 7. Anticipated functional outcomes upon discharge from inpatient rehab are supervision and min assist  with PT, supervision and min assist with OT, n/a with SLP. 8. Estimated rehab length of stay to reach the above functional goals is: 12-16 days. 9. Anticipated discharge destination: Home 10. Overall Rehab/Functional Prognosis: good   RECOMMENDATIONS: This patient's condition is appropriate for continued rehabilitative care in the following setting: CIR if caregiver support available upon discharge. Patient has agreed to participate in recommended program. Yes Note that insurance prior authorization may be required for reimbursement for recommended care.   Comment: Rehab Admissions Coordinator to follow up.   I have personally performed a face to face diagnostic evaluation, including, but not limited to relevant history and physical exam findings, of this patient and developed relevant assessment and plan.  Additionally, I have reviewed and concur with the physician assistant's documentation above.    Delice Lesch, MD, ABPMR Lavon Paganini Angiulli, PA-C 04/16/2019        Revision History                     Routing History

## 2019-04-19 NOTE — Progress Notes (Signed)
Inpatient Rehabilitation Admissions Coordinator  Insurance has approved an inpt rehab admit for today and bed is available. I met with patient and she is in agreement. I will contact primary service for d/c. Michiel Cowboy, SW and [place],  RN CM made aware as well as RN, Rod Holler. I will make the arrangements to admit today.   Danne Baxter, RN, MSN Rehab Admissions Coordinator [phone] 04/19/2019 11:13 AM

## 2019-04-20 ENCOUNTER — Inpatient Hospital Stay (HOSPITAL_COMMUNITY): Payer: Medicare HMO | Admitting: Occupational Therapy

## 2019-04-20 ENCOUNTER — Inpatient Hospital Stay (HOSPITAL_COMMUNITY): Payer: Medicare HMO | Admitting: Physical Therapy

## 2019-04-20 DIAGNOSIS — I639 Cerebral infarction, unspecified: Secondary | ICD-10-CM

## 2019-04-20 LAB — CBC WITH DIFFERENTIAL/PLATELET
Abs Immature Granulocytes: 0.01 10*3/uL (ref 0.00–0.07)
Basophils Absolute: 0 10*3/uL (ref 0.0–0.1)
Basophils Relative: 1 %
Eosinophils Absolute: 0.1 10*3/uL (ref 0.0–0.5)
Eosinophils Relative: 2 %
HCT: 36.5 % (ref 36.0–46.0)
Hemoglobin: 12.3 g/dL (ref 12.0–15.0)
Immature Granulocytes: 0 %
Lymphocytes Relative: 23 %
Lymphs Abs: 0.9 10*3/uL (ref 0.7–4.0)
MCH: 30.9 pg (ref 26.0–34.0)
MCHC: 33.7 g/dL (ref 30.0–36.0)
MCV: 91.7 fL (ref 80.0–100.0)
Monocytes Absolute: 0.3 10*3/uL (ref 0.1–1.0)
Monocytes Relative: 9 %
Neutro Abs: 2.4 10*3/uL (ref 1.7–7.7)
Neutrophils Relative %: 65 %
Platelets: 203 10*3/uL (ref 150–400)
RBC: 3.98 MIL/uL (ref 3.87–5.11)
RDW: 13.3 % (ref 11.5–15.5)
WBC: 3.7 10*3/uL — ABNORMAL LOW (ref 4.0–10.5)
nRBC: 0 % (ref 0.0–0.2)

## 2019-04-20 LAB — COMPREHENSIVE METABOLIC PANEL
ALT: 42 U/L (ref 0–44)
AST: 45 U/L — ABNORMAL HIGH (ref 15–41)
Albumin: 3.2 g/dL — ABNORMAL LOW (ref 3.5–5.0)
Alkaline Phosphatase: 33 U/L — ABNORMAL LOW (ref 38–126)
Anion gap: 9 (ref 5–15)
BUN: 23 mg/dL (ref 8–23)
CO2: 26 mmol/L (ref 22–32)
Calcium: 8.6 mg/dL — ABNORMAL LOW (ref 8.9–10.3)
Chloride: 104 mmol/L (ref 98–111)
Creatinine, Ser: 0.76 mg/dL (ref 0.44–1.00)
GFR calc Af Amer: >60 mL/min (ref >60)
GFR calc non Af Amer: >60 mL/min (ref >60)
Glucose, Bld: 95 mg/dL (ref 70–99)
Potassium: 3.5 mmol/L (ref 3.5–5.1)
Sodium: 139 mmol/L (ref 135–145)
Total Bilirubin: 0.8 mg/dL (ref 0.3–1.2)
Total Protein: 5.2 g/dL — ABNORMAL LOW (ref 6.5–8.1)

## 2019-04-20 NOTE — Evaluation (Signed)
Occupational Therapy Assessment and Plan  Patient Details  Name: Brooke Collier MRN: [id number] Date of Birth: 1936/02/04  OT Diagnosis: acute pain, ataxia, cognitive deficits, muscular wasting and disuse atrophy and muscle weakness (generalized) Rehab Potential: Rehab Potential (ACUTE ONLY): Good ELOS: 7-9 days   Today's Date: 04/20/2019 OT Individual Time: 1104-1200 OT Individual Time Calculation (min): 56 min     Problem List:  Patient Active Problem List   Diagnosis Date Noted  . Right-sided lacunar stroke ([place]) 04/19/2019  . Thalamic infarct, acute ([place]) 04/19/2019  . Anxiety state   . Chronic cystitis   . Cerebral thrombosis with cerebral infarction 04/15/2019  . Weakness of extremity 04/14/2019  . Stroke ([place])   . Hypertensive urgency   . Protein-calorie malnutrition, severe 05/06/2016  . Postoperative anemia due to acute blood loss   . Hip fracture ([place]) 05/04/2016  . Closed comminuted intertrochanteric fracture of proximal end of left femur ([place])   . HYPOTHYROIDISM 11/17/2008  . UNSPECIFIED VITAMIN D DEFICIENCY 11/17/2008  . HLD (hyperlipidemia) 11/17/2008  . ANEMIA 11/17/2008  . 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 Essential hypertension 07/21/2006  . ALLERGIC RHINITIS 07/21/2006  . ARTHRITIS 07/21/2006    Past Medical History:  Past Medical History:  Diagnosis Date  . Allergy   . Chronic interstitial cystitis   . Hypertension    Past Surgical History:  Past Surgical History:  Procedure Laterality Date  . ABDOMINAL HYSTERECTOMY    . INTRAMEDULLARY (IM) NAIL INTERTROCHANTERIC  05/04/2016  . INTRAMEDULLARY (IM) NAIL INTERTROCHANTERIC Left 05/04/2016   Procedure: INTRAMEDULLARY (IM) NAIL INTERTROCHANTRIC;  Surgeon: Mcarthur Rossetti, MD;  Location: [facility];  Service: Orthopedics;  Laterality: Left;  . tonsillectomyy      Assessment & Plan Clinical Impression:  Brooke Collier is a 84 year old right-handed female with history of  hypertension, anxiety, chronic interstitial cystitis.  Patient lives alone independent prior to admission. Two-level home with ramped entrance.  Patient presented 04/14/2019 with lower extremity weakness.  Blood pressure 170/79.  Admission chemistries unremarkable, urine drug screen negative, urinalysis negative nitrite, SARS coronavirus negative.  Cranial CT scan unremarkable for acute intracranial process.  Patient did not receive TPA.  MRI showed acute on chronic small vessel ischemia in the thalami.  An 8 mm right lateral thalamic internal capsule lacune is noted along with evidence of punctate contralateral left ventral thalamic infarction.  CT angiogram of head and neck showed generalized ectasia with mild atherosclerotic changes with 50% left supraclinoid ICA stenosis only.  Echocardiogram with ejection fraction of 65% without emboli.  Aspirin Plavix added for CVA prophylaxis x3 weeks then Plavix alone.  Subcutaneous Lovenox for DVT prophylaxis.  Maintain on a regular diet.  Therapy evaluations completed and patient was admitted for a comprehensive rehab program.  Patient transferred to CIR on 04/19/2019 .   Patient currently requires min A grossly with basic self-care skills secondary to muscle weakness, ataxia and decreased coordination, decreased memory and delayed processing. Prior to hospitalization, patient could complete ADLs including toileting, dressing, and bathing with independence without AD and IADLs including cooking, driving, laundry, light housekeeping and medication management with independence.  Patient will benefit from skilled intervention to increase independence with basic self-care skills, increase FMC and coordination, increase static/dynamic standing balance to facilitate increased independence with iADL prior to discharge home with family able to provide 24 hour assist.  Anticipate patient will require intermittent supervision and minimal physical assistance and follow up home  health.  OT - End of Session Endurance Deficit:  Yes Endurance Deficit Description: Decreased actiity tolerance. OT Assessment Rehab Potential (ACUTE ONLY): Good OT Patient demonstrates impairments in the following area(s): Balance;Cognition;Endurance;Motor;Pain;Safety;Sensory OT Basic ADL's Functional Problem(s): Grooming;Bathing;Dressing;Toileting OT Advanced ADL's Functional Problem(s): Simple Meal Preparation;Light Housekeeping OT Transfers Functional Problem(s): Toilet;Tub/Shower OT Additional Impairment(s): Fuctional Use of Upper Extremity OT Plan OT Intensity: Minimum of 1-2 x/day, 45 to 90 minutes OT Frequency: 5 out of 7 days OT Duration/Estimated Length of Stay: 7-9 days OT Treatment/Interventions: Balance/vestibular training;Cognitive remediation/compensation;Community reintegration;Discharge planning;DME/adaptive equipment instruction;Functional mobility training;Neuromuscular re-education;Self Care/advanced ADL retraining;Pain management;Patient/family education;Therapeutic Activities;Therapeutic Exercise;UE/LE Strength taining/ROM;UE/LE Coordination activities OT Basic Self-Care Anticipated Outcome(s): Mod I with AE/DME as needed OT Toileting Anticipated Outcome(s): Mod I with AE/DME as needed OT Bathroom Transfers Anticipated Outcome(s): Mod I with AE/DME as needed OT Recommendation Recommendations for Other Services: Speech consult Patient destination: Home Follow Up Recommendations: Home health OT;24 hour supervision/assistance Equipment Details: TBD   Skilled Therapeutic Intervention Patient met seated in wc in agreement with OT eval/treatment. Patient very pleasant and motivated to participate. Skilled OT services provided for functional transfers, functional mobility with use of RW, and self-care re-education as detailed below. Patient notes increased discomfort in bilateral knees this date secondary to previous therapy session with pain rating of 2/10. Functional  mobility to bathroom with use of RW and Min A. Patient completed toilet transfer to standard commode without DME with Min A and min vc's for safety with RW including hand placement and proximity to commode. Toileitng with Min A for steadying in standing with clothing management only. Patient able to complete hygiene seated on commode without assist. Shower transfer with Min A and RW to TTB. Patient engaged in UB bath/dress with supervision A and LB bath/dress with Min A for steadying in standing to wash perineum. Patient reports some "weakness" noted in RUE with bathing/dressing tasks. Grooming seated at sink level with set-up assist.Patient left seated in wc with belt alarm activated, call bell within reach and all needs met.    OT Evaluation Precautions/Restrictions  Precautions Precautions: Fall Restrictions Weight Bearing Restrictions: No General Chart Reviewed: Yes Family/Caregiver Present: No Therapy Vitals Temp: 98.1 F (36.7 C) Pulse Rate: 67 Resp: 18 BP: (!) 145/86 Patient Position (if appropriate): Sitting Oxygen Therapy SpO2: 100 % O2 Device: Room Air Pain Pain Assessment Pain Scale: 0-10 Pain Score: 2  Pain Type: Acute pain Pain Location: Knee Pain Orientation: Left Home Living/Prior Functioning Home Living Family/patient expects to be discharged to:: Private residence Living Arrangements: Alone Available Help at Discharge: Family, Available 24 hours/day(Son/daugther able to provide 24 hour assist.) Type of Home: House Home Access: Ramped entrance Home Layout: Two level, Able to live on main level with bedroom/bathroom Alternate Level Stairs-Number of Steps: 8 with R rail ascending Alternate Level Stairs-Rails: Right Bathroom Shower/Tub: Multimedia programmer: Handicapped height Bathroom Accessibility: Yes  Lives With: Alone IADL History Homemaking Responsibilities: Yes Current License: Yes Mode of Transportation: Car Education: Patient reports that  she hasn't driven much since March 2020. Occupation: Retired Leisure and Hobbies: Reading, cooking/baking Prior Function Level of Independence: Independent with transfers, Independent with homemaking with ambulation, Independent with basic ADLs, Independent with gait  Able to Take Stairs?: Yes Driving: Yes Vocation: Retired ADL ADL Grooming: Setup Where Assessed-Grooming: Sitting at sink Upper Body Bathing: Setup Where Assessed-Upper Body Bathing: Shower Lower Body Bathing: Minimal assistance Where Assessed-Lower Body Bathing: Shower Upper Body Dressing: Supervision/safety Where Assessed-Upper Body Dressing: Other (Comment)(sitting) Lower Body Dressing: Supervision/safety Where Assessed-Lower Body Dressing: Chair Toileting: Minimal assistance Where  Assessed-Toileting: Glass blower/designer: Psychiatric nurse Method: Arts development officer: Energy manager: Medical sales representative Method: Librarian, academic: Radio broadcast assistant, Grab bars, Walk in Retail buyer: Environmental education officer Method: Stand pivot Vision Baseline Vision/History: Cataracts Patient Visual Report: No change from baseline Vision Assessment?: Yes;Vision impaired- to be further tested in functional context Ocular Range of Motion: Within Functional Limits Alignment/Gaze Preference: Within Defined Limits Tracking/Visual Pursuits: Decreased smoothness of eye movement to LEFT superior field Perception  Perception: Within Functional Limits Praxis Praxis: Intact Cognition Overall Cognitive Status: Within Functional Limits for tasks assessed Arousal/Alertness: Awake/alert Orientation Level: Person;Place;Situation Person: Oriented Place: Oriented Situation: Oriented Year: 2021 Month: March Day of Week: Correct Memory: Appears intact Immediate Memory Recall: Sock;Blue;Bed Memory Recall Sock: Not  able to recall Memory Recall Blue: Without Cue Memory Recall Bed: Without Cue Attention: Selective Selective Attention: Appears intact Awareness: Appears intact Problem Solving: Impaired Problem Solving Impairment: Verbal basic;Functional basic Safety/Judgment: Impaired Comments: Patient required vc's for safety with DME during functional transfers. Sensation Sensation Light Touch: Impaired Detail Peripheral sensation comments: Patient c/o numbness/tingling in LUE. Hot/Cold: Appears Intact Coordination Gross Motor Movements are Fluid and Coordinated: No Fine Motor Movements are Fluid and Coordinated: No Coordination and Movement Description: WFL with ADL tasks but deficit noticed with box and blocks; R: R: 34, L: 26 9 Hole Peg Test: R:33sec L: 45sec Motor  Motor Motor: Ataxia Motor - Skilled Clinical Observations: Mild hemiparesis in LUE and slightly decreased [place] L > R. Mobility  Transfers Sit to Stand: Minimal Assistance - Patient > 75% Stand to Sit: Minimal Assistance - Patient > 75%  Trunk/Postural Assessment  Cervical Assessment Cervical Assessment: Exceptions to WFL(Forward head) Thoracic Assessment Thoracic Assessment: Exceptions to WFL(Kyphotoic) Postural Control Postural Control: Deficits on evaluation  Balance Balance Balance Assessed: Yes Static Sitting Balance Static Sitting - Level of Assistance: 6: Modified independent (Device/Increase time) Dynamic Sitting Balance Dynamic Sitting - Level of Assistance: 5: Stand by assistance Sitting balance - Comments: Patient able to complete LB bath/dress in sitting with slight LOB to L. Able to self-correct. Static Standing Balance Static Standing - Level of Assistance: 4: Min assist Dynamic Standing Balance Dynamic Standing - Level of Assistance: 4: Min assist Extremity/Trunk Assessment RUE Assessment RUE Assessment: Within Functional Limits Passive Range of Motion (PROM) Comments: WFL Active Range of Motion  (AROM) Comments: WFL General Strength Comments: RUE MMT: 4+/5 LUE Assessment LUE Assessment: Exceptions to [facility] Passive Range of Motion (PROM) Comments: WFL Active Range of Motion (AROM) Comments: WFL General Strength Comments: LUE MMT: 4/5   Refer to Care Plan for Long Term Goals  Recommendations for other services: None    Discharge Criteria: Patient will be discharged from OT if patient refuses treatment 3 consecutive times without medical reason, if treatment goals not met, if there is a change in medical status, if patient makes no progress towards goals or if patient is discharged from hospital.  The above assessment, treatment plan, treatment alternatives and goals were discussed and mutually agreed upon: by patient  Elyza Whitt R [place] 04/20/2019, 4:02 PM

## 2019-04-20 NOTE — Progress Notes (Signed)
Occupational Therapy Session Note  Patient Details  Name: Brooke Collier MRN: [id number] Date of Birth: 1935/10/13  Today's Date: 04/20/2019 OT Individual Time: 1300-1415 OT Individual Time Calculation (min): 75 min    Short Term Goals: Week 1:  OT Short Term Goal 1 (Week 1): STG=LTG  Skilled Therapeutic Interventions/Progress Updates:  Patient met seated in wc this afternoon in agreement with OT treatment session. This therapist asked patient how she was doing with patient becoming emotional recounting experience with nursing staff yesterday evening. Patient reports that she has frequent urination secondary to cystitis and felt like she was an inconvenience to night staff last night. Patient also unhappy with being told to limit her liquid intake prior to bedtime to reduce episodes of urination at night. OT provided emotional support and problem solving including use of BSC with nursing staff at night to decrease time needed to complete toileting tasks. Patient in agreement with plan. OT communicated with day nursing staff for carryover with night nursing staff. Patient indicated need to void requiring CGA for sit to stand from EOB with verbal cues for hand placement. Patient ambulated to standard commode in bathroom with Min A and RW with ability to complete toileting/hygiene/clothing management with Min A for steadying in standing only. Patient ambulated to EOB or functional transfer training in preparation for use of BSC at night. Patient able to complete stand-pivot txf. EOB <> wc with CGA/Min A. Coordination/dexterity assessments completed: Box and blocks:  L = 26, R = 34 52. 9 hole peg:  L = 45 sec, R = 33 sec. Patient returned to supine with sup A, call bell within reach, and bed alarm activated.      Therapy Documentation Precautions:  Precautions Precautions: Fall Restrictions Weight Bearing Restrictions: No   Therapy/Group: Individual Therapy  Brooke Collier R  [place]-Davis 04/20/2019, 2:07 PM

## 2019-04-20 NOTE — Plan of Care (Signed)
  Problem: Consults Goal: RH STROKE PATIENT EDUCATION Description: Patient will be able to independently discuss stroke education  Outcome: Progressing Goal: Nutrition Consult-if indicated Outcome: Progressing Goal: Diabetes Guidelines if Diabetic/Glucose > 140 Description: If diabetic or lab glucose is > 140 mg/dl - Initiate Diabetes/Hyperglycemia Guidelines & Document Interventions  Outcome: Progressing   Problem: RH BOWEL ELIMINATION Goal: RH STG MANAGE BOWEL WITH ASSISTANCE Description: STG Manage Bowel with supervision Outcome: Progressing Goal: RH STG MANAGE BOWEL W/MEDICATION W/ASSISTANCE Description: STG Manage Bowel with Medication independently  Outcome: Progressing   Problem: RH BLADDER ELIMINATION Goal: RH STG MANAGE BLADDER WITH ASSISTANCE Description: STG Manage Bladder With supervision  Outcome: Progressing Goal: RH STG MANAGE BLADDER WITH MEDICATION WITH ASSISTANCE Description: STG Manage Bladder With Medication independently  Outcome: Progressing Goal: RH STG MANAGE BLADDER WITH EQUIPMENT WITH ASSISTANCE Description: STG Manage Bladder With Equipment independently  Outcome: Progressing   Problem: RH SKIN INTEGRITY Goal: RH STG SKIN FREE OF INFECTION/BREAKDOWN Outcome: Progressing Goal: RH STG MAINTAIN SKIN INTEGRITY WITH ASSISTANCE Description: STG Maintain Skin Integrity With supervision  Outcome: Progressing Goal: RH STG ABLE TO PERFORM INCISION/WOUND CARE W/ASSISTANCE Description: STG Able To Perform Incision/Wound care with supervision  Outcome: Progressing   Problem: RH SAFETY Goal: RH STG ADHERE TO SAFETY PRECAUTIONS W/ASSISTANCE/DEVICE Description: STG Adhere to Safety Precautions With Assistance/Device using modified independence  Outcome: Progressing Goal: RH STG DECREASED RISK OF FALL WITH ASSISTANCE Description: STG Decreased Risk of Fall With mod I  Outcome: Progressing   Problem: RH COGNITION-NURSING Goal: RH STG USES MEMORY  AIDS/STRATEGIES W/ASSIST TO PROBLEM SOLVE Description: STG Uses Memory Aids/Strategies With mod I to Problem Solve. Outcome: Progressing Goal: RH STG ANTICIPATES NEEDS/CALLS FOR ASSIST W/ASSIST/CUES Description: STG Anticipates Needs/Calls for Assist With mod I  Outcome: Progressing   Problem: RH KNOWLEDGE DEFICIT Goal: RH STG INCREASE KNOWLEDGE OF DIABETES Outcome: Progressing Goal: RH STG INCREASE KNOWLEDGE OF HYPERTENSION Outcome: Progressing Goal: RH STG INCREASE KNOWLEDGE OF DYSPHAGIA/FLUID INTAKE Outcome: Progressing Goal: RH STG INCREASE KNOWLEGDE OF HYPERLIPIDEMIA Outcome: Progressing Goal: RH STG INCREASE KNOWLEDGE OF STROKE PROPHYLAXIS Outcome: Progressing   Problem: RH Vision Goal: RH LTG Vision (Specify) Outcome: Progressing

## 2019-04-20 NOTE — Progress Notes (Signed)
[facility] PHYSICAL MEDICINE & REHABILITATION PROGRESS NOTE   Subjective/Complaints:  Doing ok in therapy , some burning and urinary frequency  ROS- denies cp SOB, N/V/D Objective:   No results found. Recent Labs    04/19/19 1612 04/20/19 0520  WBC 4.7 3.7*  HGB 13.1 12.3  HCT 38.7 36.5  PLT 223 203   Recent Labs    04/19/19 1612 04/20/19 0520  NA  --  139  K  --  3.5  CL  --  104  CO2  --  26  GLUCOSE  --  95  BUN  --  23  CREATININE 0.95 0.76  CALCIUM  --  8.6*   No intake or output data in the 24 hours ending 04/20/19 0852   Physical Exam: Vital Signs Blood pressure (!) 183/81, pulse 62, temperature 97.7 F (36.5 C), resp. rate 18, height 5\' 4"  (1.626 m), weight 44.1 kg, SpO2 100 %.  General: No acute distress Mood and affect are appropriate Heart: Regular rate and rhythm no rubs murmurs or extra sounds Lungs: Clear to auscultation, breathing unlabored, no rales or wheezes Abdomen: Positive bowel sounds, soft nontender to palpation, nondistended Extremities: No clubbing, cyanosis, or edema Skin: No evidence of breakdown, no evidence of rash Neurologic: Cranial nerves II through XII intact, motor strength is 5/5 in RIght and 4/5 left deltoid, bicep, tricep, grip, hip flexor, knee extensors, ankle dorsiflexor and plantar flexor Sensory exam normal sensation to light touch and proprioception in bilateral upper and lower extremities Cerebellar exam normal finger to nose to finger as well as heel to shin in bilateral upper and lower extremities Musculoskeletal: Full range of motion in all 4 extremities. No joint swelling    Assessment/Plan: 1. 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  Physiatrist is providing close team supervision and 24 hour management of active medical problems listed below.  Physiatrist and rehab team continue to  assess barriers to discharge/monitor patient progress toward functional and medical goals  Care Tool:  Bathing              Bathing assist       Upper Body Dressing/Undressing Upper body dressing   What is the patient wearing?: Pull over shirt    Upper body assist Assist Level: Set up assist    Lower Body Dressing/Undressing Lower body dressing      What is the patient wearing?: Pants     Lower body assist Assist for lower body dressing: Set up assist     Toileting Toileting    Toileting assist Assist for toileting: Contact Guard/Touching assist     Transfers Chair/bed transfer  Transfers assist           Locomotion Ambulation   Ambulation assist              Walk 10 feet activity   Assist           Walk 50 feet activity   Assist           Walk 150 feet activity   Assist           Walk 10 feet on uneven surface  activity   Assist           Wheelchair     Assist               Wheelchair 50 feet with 2 turns activity  Assist            Wheelchair 150 feet activity     Assist          Blood pressure (!) 183/81, pulse 62, temperature 97.7 F (36.5 C), resp. rate 18, height 5\' 4"  (1.626 m), weight 44.1 kg, SpO2 100 %.  Medical Problem List and Plan: 1.Lower extremity weaknesssecondary to bilateral thalamic lacunar infarct secondary to small vessel disease -patient may shower, team conf in am  -ELOS/Goals: 12-16d , supervision to [place] A 2. Antithrombotics: -DVT/anticoagulation:Lovenox- PLT normal  -antiplatelet therapy: Aspirin 81 mg daily, Plavix 75 mg daily x3 weeks then Plavix alone 3. Pain Management:Tylenol as needed, start qhs gabapentin for burning pain LUE , pyridium for urine 4. Mood:BuSpar 5 mg twice daily, Xanax as needed took 0.5mg  1/2 tab qnoon and q 5pm at home -antipsychotic agents: N/A 5. Neuropsych: This  patientiscapable of making decisions on herown behalf. 6. Skin/Wound Care:Routine skin checks 7. Fluids/Electrolytes/Nutrition:Routine in and outs with follow-up , BMET normal 8. Hypertension. Norvasc 2.5 mg daily,Avapro 150 mg daily. Monitor with increased mobility  Vitals:   04/19/19 1947 04/20/19 0454  BP: (!) 108/97 (!) 183/81  Pulse: 62 62  Resp: 18 18  Temp: 97.6 F (36.4 C) 97.7 F (36.5 C)  SpO2: 97% 100%  labile , will monitor for orthostasis 9. Hyperlipidemia. Lipitor 10. Chronic interstitial cystitis. Latest urinalysis negative nitrite.    LOS: 1 days A FACE TO [place] E Edom Schmuhl 04/20/2019, 8:52 AM

## 2019-04-20 NOTE — Evaluation (Signed)
Physical Therapy Assessment and Plan  Patient Details  Name: Brooke Collier MRN: [id number] Date of Birth: 07-11-1935  PT Diagnosis: Abnormality of gait, Ataxia, Ataxic gait, Coordination disorder, Difficulty walking and Muscle weakness Rehab Potential: Excellent ELOS: 7-10 days   Today's Date: 04/20/2019 PT Individual Time: 0758-0900 PT Individual Time Calculation (min): 62 min    Problem List:  Patient Active Problem List   Diagnosis Date Noted  . Right-sided lacunar stroke ([place]) 04/19/2019  . Thalamic infarct, acute ([place]) 04/19/2019  . Anxiety state   . Chronic cystitis   . Cerebral thrombosis with cerebral infarction 04/15/2019  . Weakness of extremity 04/14/2019  . Stroke ([place])   . Hypertensive urgency   . Protein-calorie malnutrition, severe 05/06/2016  . Postoperative anemia due to acute blood loss   . Hip fracture ([place]) 05/04/2016  . Closed comminuted intertrochanteric fracture of proximal end of left femur ([place])   . HYPOTHYROIDISM 11/17/2008  . UNSPECIFIED VITAMIN D DEFICIENCY 11/17/2008  . HLD (hyperlipidemia) 11/17/2008  . ANEMIA 11/17/2008  . 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 Essential hypertension 07/21/2006  . ALLERGIC RHINITIS 07/21/2006  . ARTHRITIS 07/21/2006    Past Medical History:  Past Medical History:  Diagnosis Date  . Allergy   . Chronic interstitial cystitis   . Hypertension    Past Surgical History:  Past Surgical History:  Procedure Laterality Date  . ABDOMINAL HYSTERECTOMY    . INTRAMEDULLARY (IM) NAIL INTERTROCHANTERIC  05/04/2016  . INTRAMEDULLARY (IM) NAIL INTERTROCHANTERIC Left 05/04/2016   Procedure: INTRAMEDULLARY (IM) NAIL INTERTROCHANTRIC;  Surgeon: Mcarthur Rossetti, MD;  Location: [facility];  Service: Orthopedics;  Laterality: Left;  . tonsillectomyy      Assessment & Plan Clinical Impression: Brooke Collier is a 84 year old right-handed female with history of hypertension, anxiety, chronic interstitial  cystitis. Patient lives alone independent prior to admission. Two-level home with ramped entrance. Patient presented 04/14/2019 with lower extremity weakness. Blood pressure 170/79. Admission chemistries unremarkable, urine drug screen negative, urinalysis negative nitrite, SARS coronavirus negative. Cranial CT scan unremarkable for acute intracranial process. Patient did not receive TPA. MRI showed acute on chronic small vessel ischemia in the thalami. An 8 mm right lateral thalamic internal capsule lacuneis noted along with evidence of punctate contralateral left ventral thalamic infarction. CT angiogram of head and neck showed generalized ectasiawith mild atherosclerotic changes with 50% left supraclinoid ICA stenosis only. Echocardiogram with ejection fraction of 65% without emboli. Aspirin Plavix added for CVA prophylaxis x3 weeks then Plavix alone. Subcutaneous Lovenox for DVT prophylaxis. Maintain on a regular diet. Therapy evaluations completed and patient was admitted for a comprehensive rehab program.  Patient transferred to CIR on 04/19/2019 .   Patient currently requires min with mobility secondary to muscle weakness, ataxia and decreased coordination and decreased standing balance and decreased balance strategies.  Prior to hospitalization, patient was independent  with mobility and lived with Alone in a House home.  Home access is  Ramped entrance.  Patient will benefit from skilled PT intervention to maximize safe functional mobility and minimize fall risk for planned discharge home with intermittent assist.  Anticipate patient will benefit from follow up [facility] at discharge.  PT - End of Session Activity Tolerance: Decreased this session Endurance Deficit: Yes PT Assessment Rehab Potential (ACUTE/IP ONLY): Excellent PT Patient demonstrates impairments in the following area(s): Balance;Endurance;Motor;Perception;Safety PT Transfers Functional Problem(s): Bed to  Chair;Car;Furniture;Floor PT Locomotion Functional Problem(s): Ambulation;Stairs PT Plan PT Intensity: Minimum of 1-2 x/day ,45 to 90 minutes PT Frequency: 5  out of 7 days PT Duration Estimated Length of Stay: 7-10 days PT Treatment/Interventions: Ambulation/gait training;DME/adaptive equipment instruction;Psychosocial support;UE/LE Strength taining/ROM;Balance/vestibular training;Functional electrical stimulation;Skin care/wound management;UE/LE Coordination activities;Cognitive remediation/compensation;Functional mobility training;Splinting/orthotics;Visual/perceptual remediation/compensation;Community reintegration;Neuromuscular re-education;Stair training;Wheelchair propulsion/positioning;Discharge planning;Pain management;Therapeutic Activities;Disease management/prevention;Patient/family education;Therapeutic Exercise PT Transfers Anticipated Outcome(s): Mod(I) with LRAD PT Locomotion Anticipated Outcome(s): Mod(I) with LRAD PT Recommendation Recommendations for Other Services: Therapeutic Recreation consult Therapeutic Recreation Interventions: Kitchen group;Outing/community reintergration Follow Up Recommendations: Home health PT;Other (comment)(S for mobility/OOB) Patient destination: Home Equipment Recommended: 3 in 1 bedside comode;Tub/shower seat;Rolling walker with 5" wheels  Skilled Therapeutic Intervention  Patient received in bed, in good spirits and excited to start therapy. Able to perform bed mobility with I for rolling and S for all other bed level activities, did require MinA for balance and safety with RW for transfers during session today; tolerated gait training approximately 138f with RW before gait mechanics began to deteriorate with reduced control of LLE and reduced toe clearance as fatigue increased. Able to navigate 12 steps with rail and MinA but very fatigued afterwards. Required heavy ModA for balance to ambulate approximately 633fto nearby chair with U HHA due to  significant gait impairments and fatigue. Some parts of evaluation limited by fatigue and safety concerns given significant difficulties with balance without BUE support. She was left up in the chair with all needs met, seat belt alarm active and all needs otherwise met this morning.   PT Evaluation Precautions/Restrictions Precautions Precautions: Fall Restrictions Weight Bearing Restrictions: No General Chart Reviewed: Yes Response to Previous Treatment: Patient with no complaints from previous session. Family/Caregiver Present: No Vital Signs Pain Pain Assessment Pain Scale: 0-10 Pain Score: 0-No pain Patients Stated Pain Goal: 0 Home Living/Prior Functioning Home Living Living Arrangements: Alone Available Help at Discharge: Family;Available 24 hours/day(son/daughter work remotely and can provide 24/7A) Type of Home: House Home Access: RaLoveTwo level;Able to live on main level with bedroom/bathroom Alternate Level Stairs-Number of Steps: 8 with R rail ascending Alternate Level Stairs-Rails: Right Bathroom Shower/Tub: WaMultimedia programmerHandicapped height Bathroom Accessibility: Yes  Lives With: Alone Prior Function Level of Independence: Independent with transfers;Independent with homemaking with ambulation;Independent with basic ADLs;Independent with gait  Able to Take Stairs?: Yes Driving: Yes Vocation: Retired ViRadiographer, therapeutic Assessment Additional Comments: possibly some reduced peripheral vision L eye, otherwise seems WNL visually Perception Perception: Within Functional Limits Praxis Praxis: Intact  Cognition Overall Cognitive Status: Within Functional Limits for tasks assessed Orientation Level: Oriented X4 Attention: Selective Selective Attention: Appears intact Memory: Appears intact Awareness: Appears intact Problem Solving: Impaired Problem Solving Impairment: Verbal basic;Functional  basic Safety/Judgment: Impaired Comments: cues for safety and sequencing, problem solving given acute impairments Sensation Sensation Light Touch: Appears Intact Hot/Cold: Not tested Proprioception: Impaired Detail Proprioception Impaired Details: Impaired LLE;Impaired LUE Coordination Gross Motor Movements are Fluid and Coordinated: No Fine Motor Movements are Fluid and Coordinated: No Coordination and Movement Description: more impaired L UE/LE than R Finger Nose Finger Test: moderate ataxia L UE Heel Shin Test: moderate ataxia L LE Motor  Motor Motor: Ataxia Motor - Skilled Clinical Observations: mild hemiparesis L LE- perhaps 1/2 grade weaker than R LE at eval. Ankle DF 5/5 R, 4+/5 L, quad 4+/5 R 4/5 L, hip flexion 3+/5 R 3/5 L in sitting  Mobility Bed Mobility Bed Mobility: Rolling Right;Rolling Left;Supine to Sit Rolling Right: Independent Rolling Left: Independent Supine to Sit: Supervision/Verbal cueing Transfers Transfers: Sit to Stand;Stand to Sit;Stand Pivot Transfers Sit to Stand:  Minimal Assistance - Patient > 75% Stand to Sit: Minimal Assistance - Patient > 75% Stand Pivot Transfers: Minimal Assistance - Patient > 75% Stand Pivot Transfer Details: Verbal cues for precautions/safety;Verbal cues for sequencing Transfer (Assistive device): Rolling walker Locomotion  Gait Ambulation: Yes Gait Assistance: Contact Guard/Touching assist Gait Distance (Feet): 100 Feet Assistive device: Rolling walker Gait Gait: Yes Gait Pattern: Impaired Gait Pattern: Decreased stance time - left;Decreased stride length;Decreased dorsiflexion - left;Ataxic;Trendelenburg;Decreased trunk rotation;Trunk flexed;Narrow base of support;Poor foot clearance - left Stairs / Additional Locomotion Stairs: Yes Stairs Assistance: Minimal Assistance - Patient > 75% Stair Management Technique: One rail Right Number of Stairs: 12 Wheelchair Mobility Wheelchair Mobility: No  Trunk/Postural  Assessment  Cervical Assessment Cervical Assessment: Exceptions to WFL(forward head) Thoracic Assessment Thoracic Assessment: Exceptions to WFL(kyphotic) Lumbar Assessment Lumbar Assessment: Exceptions to WFL(flat lumbar spine) Postural Control Postural Control: Deficits on evaluation  Balance Balance Balance Assessed: Yes Static Sitting Balance Static Sitting - Level of Assistance: 6: Modified independent (Device/Increase time) Dynamic Sitting Balance Dynamic Sitting - Level of Assistance: 5: Stand by assistance Sitting balance - Comments: able to put on shoes in sitting, mild posterior lean but able to correct Static Standing Balance Static Standing - Level of Assistance: 4: Min assist Dynamic Standing Balance Dynamic Standing - Level of Assistance: 3: Mod assist Extremity Assessment  RUE Assessment RUE Assessment: Not tested LUE Assessment LUE Assessment: Not tested RLE Assessment Active Range of Motion (AROM) Comments: functionally appears WNL General Strength Comments: mild hemiparesis L LE- perhaps 1/2 grade weaker than R LE at eval. Ankle DF 5/5 R, 4+/5 L, quad 4+/5 R 4/5 L, hip flexion 3+/5 R 3/5 L in sitting LLE Assessment Active Range of Motion (AROM) Comments: functionally appears WNL General Strength Comments: mild hemiparesis L LE- perhaps 1/2 grade weaker than R LE at eval. Ankle DF 5/5 R, 4+/5 L, quad 4+/5 R 4/5 L, hip flexion 3+/5 R 3/5 L in sitting    Refer to Care Plan for Long Term Goals  Recommendations for other services: Therapeutic Recreation  Kitchen group and Outing/community reintegration  Discharge Criteria: Patient will be discharged from PT if patient refuses treatment 3 consecutive times without medical reason, if treatment goals not met, if there is a change in medical status, if patient makes no progress towards goals or if patient is discharged from hospital.  The above assessment, treatment plan, treatment alternatives and goals were  discussed and mutually agreed upon: by patient  Windell Norfolk, DPT, PN1   Supplemental Physical Therapist [place]    Pager [phone] Acute Rehab Office [phone]

## 2019-04-20 NOTE — Care Management (Signed)
Inpatient [facility] Individual Statement of Services  Patient Name:  Brooke Collier  Date:  04/20/2019  Welcome to the [facility].  Our goal is to provide you with an individualized program based on your diagnosis and situation, designed to meet your specific needs.  With this comprehensive rehabilitation program, you will be expected to participate in at least 3 hours of rehabilitation therapies Monday-Friday, with modified therapy programming on the weekends.  Your rehabilitation program will include the following services:  Physical Therapy (PT), Occupational Therapy (OT), 24 hour per day rehabilitation nursing, Therapeutic Recreaction (TR), Neuropsychology, Case Management (Social Worker), Rehabilitation Medicine, Nutrition Services and Pharmacy Services  Weekly team conferences will be held on Wednesdays to discuss your progress.  Your Social Industrial/product designer will talk with you frequently to get your input and to update you on team discussions.  Team conferences with you and your family in attendance may also be held.  Expected length of stay: 12-14 days  Overall anticipated outcome: Supervision - Minimal assistance goals  Depending on your progress and recovery, your program may change. Your Social Industrial/product designer will coordinate services and will keep you informed of any changes. Your Social Worker's/Care Manager's name and contact numbers are listed  below.  The following services may also be recommended but are not provided by the [facility] will be made to provide these services after discharge if needed.  Arrangements include referral to agencies that provide these services.  Your insurance has been verified to be:  [employer] Your primary doctor is:  Rolan Lipa, MD  Pertinent information will  be shared with your doctor and your insurance company.  Care Manager: Dorien Chihuahua, RN [phone] or [phone]) [phone]   Social Worker:  Loralee Pacas, [place] or [phone]  Information discussed with and copy given to patient by: Margarito Liner, 04/20/2019, 11:49 AM

## 2019-04-20 NOTE — Progress Notes (Signed)
Inpatient Rehabilitation  Patient information reviewed and entered into eRehab system by Letisha Yera M. King Pinzon, M.A., CCC/SLP, PPS Coordinator.  Information including medical coding, functional ability and quality indicators will be reviewed and updated through discharge.    

## 2019-04-20 NOTE — Care Management (Signed)
Patient Details  Name: Brooke Collier MRN: [id number] Date of Birth: 1936/01/26  Today's Date: 04/20/2019  Problem List:  Patient Active Problem List   Diagnosis Date Noted  . Right-sided lacunar stroke ([place]) 04/19/2019  . Thalamic infarct, acute ([place]) 04/19/2019  . Anxiety state   . Chronic cystitis   . Cerebral thrombosis with cerebral infarction 04/15/2019  . Weakness of extremity 04/14/2019  . Stroke ([place])   . Hypertensive urgency   . Protein-calorie malnutrition, severe 05/06/2016  . Postoperative anemia due to acute blood loss   . Hip fracture ([place]) 05/04/2016  . Closed comminuted intertrochanteric fracture of proximal end of left femur ([place])   . HYPOTHYROIDISM 11/17/2008  . UNSPECIFIED VITAMIN D DEFICIENCY 11/17/2008  . HLD (hyperlipidemia) 11/17/2008  . ANEMIA 11/17/2008  . 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 Essential hypertension 07/21/2006  . ALLERGIC RHINITIS 07/21/2006  . ARTHRITIS 07/21/2006   Past Medical History:  Past Medical History:  Diagnosis Date  . Allergy   . Chronic interstitial cystitis   . Hypertension    Past Surgical History:  Past Surgical History:  Procedure Laterality Date  . ABDOMINAL HYSTERECTOMY    . INTRAMEDULLARY (IM) NAIL INTERTROCHANTERIC  05/04/2016  . INTRAMEDULLARY (IM) NAIL INTERTROCHANTERIC Left 05/04/2016   Procedure: INTRAMEDULLARY (IM) NAIL INTERTROCHANTRIC;  Surgeon: Mcarthur Rossetti, MD;  Location: [facility];  Service: Orthopedics;  Laterality: Left;  . tonsillectomyy     Social History:  reports that she has never smoked. She has never used smokeless tobacco. She reports that she does not drink alcohol or use drugs.  Family / Support Systems Patient Roles: Parent Children: daughter and son Anticipated Caregiver: daughter and son  Ability/Limitations of Caregiver: daughter Adult nurse) and son (24) teach and can work remote Careers adviser: 24/7  Social History Preferred language:  English Religion:  Read: Yes Write: Yes Employment Status: Retired Date Retired/Disabled/Unemployed: Pharmacist, hospital   Abuse/Neglect Abuse/Neglect Assessment Can Be Completed: Yes Physical Abuse: Denies Verbal Abuse: Denies Sexual Abuse: Denies Exploitation of patient/patient's resources: Denies Self-Neglect: Denies  Emotional Status Pt's affect, behavior and adjustment status: Normal affect, tearful, anxious and nervous Recent Psychosocial Issues: Anxiety Psychiatric History: Anxiety, mood disorder  Patient / Family Perceptions, Expectations & Goals Pt/Family understanding of illness & functional limitations: Patient has a fair understanding of health status and functional limitations Premorbid pt/family roles/activities: Independent PTA; daughter ordered groceries and had them delivered; patient was home bound since pandemic Anticipated changes in roles/activities/participation: Will need supervision for a short period at discharge Pt/family expectations/goals: The patient would like to get back to her pre-admission functional status or at least be able to get up, walk around the home, simple meal prep and toilet/B+D on her own so she is not a "burden" to her children.  Occupational psychologist available at discharge: Patient was driving PTA; daughter will provide transportation at discharge Resource referrals recommended: Neuropsychology  Discharge Planning Living Arrangements: Alone Support Systems: Children Type of Residence: Private residence Insurance Resources: Medicare Money Management: Patient Does the patient have any problems obtaining your medications?: No Home Management: Patient managed the home, laundry, cleaning etc and daughter ordered groceries and had them delivered Patient/Family Preliminary Plans: The patient plans to return home with daughter coming in to assist as needed [place] Barriers to Discharge: Decreased caregiver support Sw Barriers to Discharge  Comments: Two level home with ramped entrance and access to bath/bed on 1 st level Social Work Anticipated Follow Up Needs: [place] Additional Notes/Comments: Daughter is a  teacher (local) and can work remotely Expected length of stay: ELOS 12-14 days  Clinical Impression The patient is very emotional just talking about her current situation and fear of catching the virus. She noted she has been home-bound and not ventured out for fear of exposure.She did not mind being at home and limited visitors and had watched her diet 2/2 other health issues and then she had a stroke. Very tearful when talking about her current situation, history and fear that she will be a burden to her children. Would like to be as independent as possible and return to her home. States an understanding of the plan of care and recommendations for [facility] services to continue rehab after discharge.  Dorien Chihuahua B 04/20/2019, 12:07 PM

## 2019-04-21 ENCOUNTER — Inpatient Hospital Stay (HOSPITAL_COMMUNITY): Payer: Medicare HMO | Admitting: Occupational Therapy

## 2019-04-21 ENCOUNTER — Inpatient Hospital Stay (HOSPITAL_COMMUNITY): Payer: Medicare HMO

## 2019-04-21 MED ORDER — RESOURCE THICKENUP CLEAR PO POWD
ORAL | Status: DC | PRN
  Filled 2019-04-21: qty 125

## 2019-04-21 NOTE — Progress Notes (Signed)
Patient given prn daily dose of pyridium, due to the scanner not working in patient's room medication was entered manually. Comment left in [facility] as well.

## 2019-04-21 NOTE — Progress Notes (Signed)
Occupational Therapy Session Note  Patient Details  Name: Brooke Collier MRN: [id number] Date of Birth: Oct 11, 1935  Today's Date: 04/21/2019 OT Individual Time: 1100-1202 OT Individual Time Calculation (min): 62 min    Short Term Goals: Week 1:  OT Short Term Goal 1 (Week 1): STG=LTG  Skilled Therapeutic Interventions/Progress Updates:  Patient met lying supine in bed this date in agreement with OT treatment session. Patient c/o 4/10 pain at start of session, decreasing to 2/10 after session with repositioning and increased ambulation. Bed mobility for supine to EOB with supervision A. Functional mobility with RW and Min A progressing to CGA to gather ADL items in prep for bathing/dressing task. OT provided education on walker safety with item retrieval with education on use of walker basket/bag to increase independence and decrease need for assistance. Toileting/hygiene/clothing management with CGA for clothing management in standing. Patient completed UB bath/dress with supervision A seated on TTB. Education on safety with bathing/dressing at home including use of shower mat, non-slip rug on the outside of shower, and importance of drying feet prior to ambulation form shower. Patient notes that she stood to bathe at home. Patient stood to wash perineum with use of grab bar and CGA for safety with LL shower-head demonstrating good safety awareness. Patient ambulated to commode preferring to dress seated on commode in bathroom. LB dress with Min A without use of RW. Education on purpose/use of TED hose with patient donning seated in wc with Mod A. Patient left seated in wc with belt alarm activated, call bell within reach and all needs met.   Therapy Documentation Precautions:  Precautions Precautions: Fall Restrictions Weight Bearing Restrictions: No   Therapy/Group: Individual Therapy  Yolani Vo R [place]Davis 04/21/2019, 12:25 PM

## 2019-04-21 NOTE — Patient Care Conference (Signed)
Inpatient RehabilitationTeam Conference and Plan of Care Update Date: 04/21/2019   Time: 10:45 AM   Patient Name: Brooke Collier      Medical Record Number: [id number]  Date of Birth: [birth year] Sex: Female         Room/Bed: 4W23C/4W23C-01 Payor Info: Payor: [employer] MEDICARE / Plan: [employer] MEDICARE HMO / Product Type: *No Product type* /    Admit Date/Time:  04/19/2019  2:58 PM  Primary Diagnosis:  <principal problem not specified>  Patient Active Problem List   Diagnosis Date Noted  . Right-sided lacunar stroke ([place]) 04/19/2019  . Thalamic infarct, acute ([place]) 04/19/2019  . Anxiety state   . Chronic cystitis   . Cerebral thrombosis with cerebral infarction 04/15/2019  . Weakness of extremity 04/14/2019  . Stroke ([place])   . Hypertensive urgency   . Protein-calorie malnutrition, severe 05/06/2016  . Postoperative anemia due to acute blood loss   . Hip fracture ([place]) 05/04/2016  . Closed comminuted intertrochanteric fracture of proximal end of left femur ([place])   . HYPOTHYROIDISM 11/17/2008  . UNSPECIFIED VITAMIN D DEFICIENCY 11/17/2008  . HLD (hyperlipidemia) 11/17/2008  . ANEMIA 11/17/2008  . 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 Essential hypertension 07/21/2006  . ALLERGIC RHINITIS 07/21/2006  . ARTHRITIS 07/21/2006    Expected Discharge Date: Expected Discharge Date: 04/29/19  Team Members Present: Physician leading conference: Dr. [name] Care Coodinator Present: Nestor Lewandowsky, RN, BSN, CRRN;Genie Sierah Lacewell, RN, MSN Nurse Present: Debroah Loop, RN PT Present: Michaelene Song, PT OT Present: Clyda Greener, OT SLP Present: Jettie Booze, CF-SLP PPS Coordinator present : Gunnar Fusi, SLP     Current Status/Progress Goal Weekly Team Focus  Bowel/Bladder   Continent B/B, LBM3/8, urinary frequency and urgency  Remain continent  QS/PRN toileting assessment   Swallow/Nutrition/ Hydration             ADL's   Supervision for UB selfcare with min  assist for LB selfcare sit to stand.  Min assist for transfers to the walk-in shower and the 3:1 with use of the RW. Slight decreased coordination and strength in the LUE compared to the right but uses it functionally at a non-dominant level  selfcare retraining, transfer training, neuromuscular re-edcuation, therapeutic activities, pt/family education, therapeutic exercises,      Mobility   bed mobility I-S, MinA for transfers with RW, 12 steps MinA R rail, gait 180ft RW minG. Fatigues easy with gait/stairs. [place] for balance with no UE support.  Mod(I) with RW  gait, balance, L LE NMR, endurance, increased independence with RW   Communication             Safety/Cognition/ Behavioral Observations            Pain   No c/o pain  Remain pain free  QS/PRN pain assessment   Skin   No evidence of skin breakdown or infection  Maintain skin integrity  QS/PRN skin assessment    Rehab Goals Patient on target to meet rehab goals: Yes *See Care Plan and progress notes for long and short-term goals.     Barriers to Discharge  Current Status/Progress Possible Resolutions Date Resolved   Nursing  Behavior  Patients anxiety            PT  Other (comments)  children work remote and can provide assist if needed, no known barriers otherwise on PT side              OT  SLP                SW Decreased caregiver support Two level home with ramped entrance and access to bath/bed on 1 st level            Discharge Planning/Teaching Needs:  Home with family assisting as needed  Transfers, toileting, medications, etc   Team Discussion: 84 yo anxious, B thalamic lacuner infarcts, BP labile, LUE burning pain, ataxic L side.  RN urgency, on pyridium, BM 3/8, numbness L hand, not sleeping well.  OT S UB self care, min A LB self care, min A transfers RW, mod goals.  PT min A transfers RW, min A gait, fatigues, needs rest breaks, goals mod I walker.   Revisions to Treatment Plan: N/A      Medical Summary Current Status: left sided ataxia, neurogenic pain, cystits Weekly Focus/Goal: Med management of nonpyogenic cystitis and neurogenic pain         Continued Need for Acute Rehabilitation Level of Care: The patient requires daily medical management by a physician with specialized training in physical medicine and rehabilitation for the following reasons: Direction of a multidisciplinary physical rehabilitation program to maximize functional independence : Yes Medical management of patient stability for increased activity during participation in an intensive rehabilitation regime.: Yes Analysis of laboratory values and/or radiology reports with any subsequent need for medication adjustment and/or medical intervention. : Yes   I attest that I was present, lead the team conference, and concur with the assessment and plan of the team.   Retta Diones 04/21/2019, 5:13 PM   Team conference was held via web/ teleconference due to [place] - 19

## 2019-04-21 NOTE — Progress Notes (Signed)
Occupational Therapy Session Note  Patient Details  Name: STORMI GOUDEAU MRN: [id number] Date of Birth: 1935-03-04  Today's Date: 04/21/2019 OT Individual Time: [id number] OT Individual Time Calculation (min): 45 min    Short Term Goals: Week 1:  OT Short Term Goal 1 (Week 1): STG=LTG  Skilled Therapeutic Interventions/Progress Updates:    Pt in wheelchair to start session, just back from the bathroom with nursing.  She was able to donn her shoes with setup assistance including tying them and then therapist took her down to the gym via wheelchair secondary to pt reporting having some pain in her knees today.  Once in the gym, she completed stand pivot transfer to the therapy mat with min assist.  She then worked on LUE coordination while in sitting.  Had her initially work on picking up 1" round pegs and placing in a peg board at chest height one at a time.  She then progressed to picking up 3, one at a time and manipulating from finger tips to palm and then back again to place them.  Once, complete she also worked on picking up playing cards one at a time and placing into suits with the LUE.  She also worked on trying to shuffle them but exhibited some difficulty with completion of this.  Finished work with use of a 1 lb dowel rod and having her complete 1 set of 10 reps for bilateral shoulder flexion as well as working on hitting a beach ball back to the therapist while holding the dowel rod.  Returned to the wheelchair and then back to the room to complete session.  She remained in the wheelchair with call button and phone in reach and safety belt in place.    Therapy Documentation Precautions:  Precautions Precautions: Fall Restrictions Weight Bearing Restrictions: No  Pain: Pain Assessment Pain Scale: 0-10 Pain Score: 2  Pain Type: Acute pain Pain Location: Knee Pain Orientation: Right;Left Pain Descriptors / Indicators: Discomfort Pain Onset: With Activity Pain  Intervention(s): Repositioned;Emotional support  Therapy/Group: Individual Therapy  Fowler Antos OTR/L 04/21/2019, 4:19 PM

## 2019-04-21 NOTE — Progress Notes (Signed)
3Physical Therapy Session Note  Patient Details  Name: Brooke Collier MRN: [id number] Date of Birth: 1935-08-05  Today's Date: 04/21/2019 PT Individual Time: [id number] and [id number] PT Individual Time Calculation (min): 44 min and 59 min    Short Term Goals: Week 1:  PT Short Term Goal 1 (Week 1): Will be able to transfer with LRAD and S PT Short Term Goal 2 (Week 1): Will score at least 40 on Berg balance test PT Short Term Goal 3 (Week 1): Will tolerate gait training at least 143ft with S and LRAD  Skilled Therapeutic Interventions/Progress Updates:    Session 1: Patient seated on EOB with NT present to complete clothing management and donning shoes, agreeable to therapy tx, denies pain. Pt reported not sleeping well and having urinary pain and needing to eat less acidic foods as they inc her pain. Sit to stand at [facility] with CGA, pt ambulated 50' with RW and CGA. Pt reported fatigue and needing to sit down, stand > sit in w/c with CGA. Pt transported to hallway outside rehab gym for energy conservation. Sit > stand at RW with minA, pt ambulated 40' with RW and CGA to w/c and sat down CGA. Her gait speed was = 36.31 seconds with a RW. Pt transported into rehab gym to participate BERG balance test (see below). Assessment complete without use of RW, she required CGA at balance for safety throughout assessment and intermittent seated rest breaks. Pt scored a 30/56 demonstrating significant fall risk with greatest deficits in activities requiring dynamic balance and single leg stance/step. Pt discouraged by inability to perform toe taps without AD, therapist provided emotional support and encouragement, once sitting performed seated marching x 10 each LE which inc pt's perspective. Stand pivot transfer from mat > w/c without AD and CGA, cues for RW positioning. Pt transported in w/c back to room, left in w/c with needs in reach and chair alarm set.    Session 2:  Patient seated in w/c upon PT arrival  with NT present, agreeable to therapy tx, denies pain but reports feeling fatigued and hungry. Therapist asked if pt needed a snack and NT brought back graham crackers. Therapist offered numerous rest breaks throughout session to accommodate pt's significant fatigue. Pt reported needing to go to the bathroom. Sit > stand at [facility] with CGA, pt ambulated 10' into bathroom with RW and CGA and performed standing balance with intermittent bouts of unilateral-no UE support to perform clothing management, stand > sit with CGA, therapist provided distant supervision for privacy, sit > stand with CGA, pt performed pericare and clothing management without assist. Pt ambulated to sink to perform hand hygiene > turned with RW and CGA to sit in w/c. Pt transported to rehab gym in w/c for time management and energy conservation. Squat pivot transfer to mat with CGA for safety. Pt performed sit <> stand x 10 without UE support and CGA-minA if beginning to LOB posteriorly as pt keeps weight in heels and has difficulty weight shifting forward. Pt performed toe taps x 20 alt LE with minA for weight shifting and B UE support on therapist's arms (therapist in front of pt with mat behind pt), intermittent LOB posteriorly d/t not weight shifting forward and also having difficulty flexing L hip enough to clear foot coming off of step. In standing, pt reached for horseshoe on either side of her and then tossed 3-5 ft away to target focusing on standing balance and truncal rotation with CGA-minA and then repeated  exercise while standing on airex with min-modA d/t posterior LOB. Pt performed A/P weight shifting on rockerboard with CGA-minA + 2 (for safety providing B HHA), pt relied significantly on second helper when weight shifting posteriorly demonstrating inability to correct posture and balance. Pt reported feeling a little nauseas and continued fatigue. Sit > stand from mat with CGA, pt ambulated 3 steps to w/c without AD and minA, pt  transported back to room. Pt wanted to wash hands, w/c brought up to sink to perform hand hygiene. Sit > stand from w/c with CGA, pt ambulated 3 steps to bed, turned, and sat down with cues for sequencing and minA d/t fatigue. Pt doffed sweater and shoes without assist. Sit > supine without assist. Pt left supine in bed with needs in reach and bed alarm set.   Therapy Documentation Precautions:  Precautions Precautions: Fall Restrictions Weight Bearing Restrictions: No Balance Balance Balance Assessed: Yes Standardized Balance Assessment Standardized Balance Assessment: Berg Balance Test Berg Balance Test Sit to Stand: Needs minimal aid to stand or to stabilize Standing Unsupported: Able to stand 2 minutes with supervision Sitting with Back Unsupported but Feet Supported on Floor or Stool: Able to sit safely and securely 2 minutes Stand to Sit: Controls descent by using hands Transfers: Able to transfer with verbal cueing and /or supervision Standing Unsupported with Eyes Closed: Able to stand 10 seconds with supervision Standing Ubsupported with Feet Together: Able to place feet together independently and stand for 1 minute with supervision From Standing, Reach Forward with Outstretched Arm: Reaches forward but needs supervision From Standing Position, Pick up Object from Floor: Able to pick up shoe, needs supervision From Standing Position, Turn to Look Behind Over each Shoulder: Looks behind from both sides and weight shifts well Turn 360 Degrees: Needs assistance while turning Standing Unsupported, Alternately Place Feet on Step/Stool: Needs assistance to keep from falling or unable to try Standing Unsupported, One Foot in Front: Able to plae foot ahead of the other independently and hold 30 seconds Standing on One Leg: Unable to try or needs assist to prevent fall Total Score: 30   Therapy/Group: Individual Therapy  Juliann Pulse SPT 04/21/2019, 7:43 AM

## 2019-04-21 NOTE — Plan of Care (Signed)
  Problem: Consults Goal: RH STROKE PATIENT EDUCATION Description: Patient will be able to independently discuss stroke education  Outcome: Progressing Goal: Nutrition Consult-if indicated Outcome: Progressing Goal: Diabetes Guidelines if Diabetic/Glucose > 140 Description: If diabetic or lab glucose is > 140 mg/dl - Initiate Diabetes/Hyperglycemia Guidelines & Document Interventions  Outcome: Progressing   Problem: RH BOWEL ELIMINATION Goal: RH STG MANAGE BOWEL WITH ASSISTANCE Description: STG Manage Bowel with supervision Outcome: Progressing Goal: RH STG MANAGE BOWEL W/MEDICATION W/ASSISTANCE Description: STG Manage Bowel with Medication independently  Outcome: Progressing   Problem: RH BLADDER ELIMINATION Goal: RH STG MANAGE BLADDER WITH ASSISTANCE Description: STG Manage Bladder With supervision  Outcome: Progressing Goal: RH STG MANAGE BLADDER WITH MEDICATION WITH ASSISTANCE Description: STG Manage Bladder With Medication independently  Outcome: Progressing Goal: RH STG MANAGE BLADDER WITH EQUIPMENT WITH ASSISTANCE Description: STG Manage Bladder With Equipment independently  Outcome: Progressing   Problem: RH SKIN INTEGRITY Goal: RH STG SKIN FREE OF INFECTION/BREAKDOWN Outcome: Progressing Goal: RH STG MAINTAIN SKIN INTEGRITY WITH ASSISTANCE Description: STG Maintain Skin Integrity With supervision  Outcome: Progressing Goal: RH STG ABLE TO PERFORM INCISION/WOUND CARE W/ASSISTANCE Description: STG Able To Perform Incision/Wound care with supervision  Outcome: Progressing   Problem: RH SAFETY Goal: RH STG ADHERE TO SAFETY PRECAUTIONS W/ASSISTANCE/DEVICE Description: STG Adhere to Safety Precautions With Assistance/Device using modified independence  Outcome: Progressing   Problem: RH SAFETY Goal: RH STG DECREASED RISK OF FALL WITH ASSISTANCE Description: STG Decreased Risk of Fall With mod I  Outcome: Progressing   Problem: RH COGNITION-NURSING Goal: RH STG  USES MEMORY AIDS/STRATEGIES W/ASSIST TO PROBLEM SOLVE Description: STG Uses Memory Aids/Strategies With mod I to Problem Solve. Outcome: Progressing Goal: RH STG ANTICIPATES NEEDS/CALLS FOR ASSIST W/ASSIST/CUES Description: STG Anticipates Needs/Calls for Assist With mod I  Outcome: Progressing

## 2019-04-22 ENCOUNTER — Inpatient Hospital Stay (HOSPITAL_COMMUNITY): Payer: Medicare HMO | Admitting: Occupational Therapy

## 2019-04-22 ENCOUNTER — Inpatient Hospital Stay (HOSPITAL_COMMUNITY): Payer: Medicare HMO | Admitting: Physical Therapy

## 2019-04-22 MED ORDER — BLOOD PRESSURE CONTROL BOOK
Freq: Once | Status: AC
  Filled 2019-04-22: qty 1

## 2019-04-22 MED ORDER — DICLOFENAC SODIUM 1 % EX GEL
2.0000 g | Freq: Four times a day (QID) | CUTANEOUS | Status: DC
  Administered 2019-04-22 – 2019-04-29 (×23): 2 g via TOPICAL
  Filled 2019-04-22: qty 100

## 2019-04-22 NOTE — Progress Notes (Signed)
Physical Therapy Session Note  Patient Details  Name: Brooke Collier MRN: [id number] Date of Birth: 04/09/1935  Today's Date: 04/22/2019 PT Individual Time: 1107-1208 PT Individual Time Calculation (min): 61 min   Short Term Goals: Week 1:  PT Short Term Goal 1 (Week 1): Will be able to transfer with LRAD and S PT Short Term Goal 2 (Week 1): Will score at least 40 on Berg balance test PT Short Term Goal 3 (Week 1): Will tolerate gait training at least 110ft with S and LRAD  Skilled Therapeutic Interventions/Progress Updates:    Pt received sitting in w/c and agreeable to therapy session. Sit<>stands using RW with CGA for steadying. Gait training ~183ft using RW with CGA for steadying - 1x standing rest break transitioned to ~36ft with L HHA and min assist for balance with pt demonstrating wider BOS and decreased gait speed with decreased L LE foot clearance and step length. Stand pivot to/from mat, no AD, with CGA/min assist for steadying and pt demonstrated very guarded movement with stiff trunk and UEs due to fear of falling. Repeated sit<>stands with level 2 theraband around knees to increase hip abductor activation 2x10 reps with max multimodal cuing for proper form/technique - pt reports some discomfort in low back during this exercise despite cuing to improve form therefore discontinued. Gait training ~22ft, no AD or HHA, including walking through doorway and turning around with min assist for balance - continues to demonstrate wide BOS, decreased L hip/knee flexion during swing, and increased double limb stance time. Dynamic standing balance task of repeated L LE foot tap on cone followed by repeated R LE foot tap x20 reps each with min/mod assist for balance due to pt having increased posterior and lateral lean in direction of the foot that is tapping cone. Gait training ~77ft, no UE support, with min assist for balance and pt demonstrating improving symmetry of gait with increasing L foot  clearance and increasing gait speed. Dynamic standing balance task of ball tossing with helper while standing in narrow BOS progressed to semi-tandem stance all with CGA/min assist for balance. Gait training ~4ft to/from and up/down ramp x2 without UE support with min assist for balance and pt demonstrating decreased gait speed, increased postural sway, and decreased trunk movement and arm swing due to fear of falling. Pt reporting fatigued at this time and deferring ambulating back to room. Transported back to room in w/c and left seated with needs in reach and seat belt alarm on.  Therapy Documentation Precautions:  Precautions Precautions: Fall Restrictions Weight Bearing Restrictions: No  Pain: Reports discomfort in low back during LE exercise, attempted to modify but discomfort continued therefore discontinued and provided seated rest break for dissipation of symptoms. Otherwise no discomfort/pain reported.    Therapy/Group: Individual Therapy  Tawana Scale, PT, DPT 04/22/2019, 8:00 AM

## 2019-04-22 NOTE — IPOC Note (Signed)
Overall Plan of Care [facility]) Patient Details Name: Brooke Collier MRN: [id number] DOB: 1935-08-01  Admitting Diagnosis: <principal problem not specified>  Hospital Problems: Active Problems:   Right-sided lacunar stroke ([place])   Thalamic infarct, acute ([place])     Functional Problem List: Nursing Endurance, Behavior  PT Balance, Endurance, Motor, Perception, Safety  OT Balance, Cognition, Endurance, Motor, Pain, Safety, Sensory  SLP    TR         Basic ADL's: OT Grooming, Bathing, Dressing, Toileting     Advanced  ADL's: OT Simple Meal Preparation, Light Housekeeping     Transfers: PT Bed to Chair, Car, Furniture, Futures trader, Metallurgist: PT Ambulation, Stairs     Additional Impairments: OT Fuctional Use of Upper Extremity  SLP        TR      Anticipated Outcomes Item Anticipated Outcome  Self Feeding    Swallowing      Basic self-care  Mod I with AE/DME as needed  Toileting  Mod I with AE/DME as needed   Bathroom Transfers Mod I with AE/DME as needed  Bowel/Bladder  Patient will continue to remain continent of bowel/bladder fore remainder of stay and thereafter  Transfers  Mod(I) with LRAD  Locomotion  Mod(I) with LRAD  Communication     Cognition     Pain  Patient will remain with a pain score of less than 3 for remained of stay  Safety/Judgment  Patient will be able to adequately make safe decisions throughout stay and there after   Therapy Plan: PT Intensity: Minimum of 1-2 x/day ,45 to 90 minutes PT Frequency: 5 out of 7 days PT Duration Estimated Length of Stay: 7-10 days OT Intensity: Minimum of 1-2 x/day, 45 to 90 minutes OT Frequency: 5 out of 7 days OT Duration/Estimated Length of Stay: 7-9 days     Due to the current state of emergency, patients may not be receiving their 3-hours of Medicare-mandated therapy.   Team Interventions: Nursing Interventions Medication Management, Psychosocial Support  PT  interventions Ambulation/gait training, DME/adaptive equipment instruction, Psychosocial support, UE/LE Strength taining/ROM, Balance/vestibular training, Functional electrical stimulation, Skin care/wound management, UE/LE Coordination activities, Cognitive remediation/compensation, Functional mobility training, Splinting/orthotics, Visual/perceptual remediation/compensation, Community reintegration, Neuromuscular re-education, IT trainer, Wheelchair propulsion/positioning, Discharge planning, Pain management, Therapeutic Activities, Disease management/prevention, Barrister's clerk education, Therapeutic Exercise  OT Interventions Training and development officer, Cognitive remediation/compensation, Community reintegration, Discharge planning, DME/adaptive equipment instruction, Functional mobility training, Neuromuscular re-education, Self Care/advanced ADL retraining, Pain management, Patient/family education, Therapeutic Activities, Therapeutic Exercise, UE/LE Strength taining/ROM, UE/LE Coordination activities  SLP Interventions    TR Interventions    SW/CM Interventions Discharge Planning, Psychosocial Support, Patient/Family Education, Disease Management/Prevention   Barriers to Discharge MD  Medical stability  Nursing Behavior Patients anxiety  PT Other (comments) children work remote and can provide assist if needed, no known barriers otherwise on PT side  OT      SLP      SW Decreased caregiver support Two level home with ramped entrance and access to bath/bed on 1 st level   Team Discharge Planning: Destination: PT-Home ,OT- Home , SLP-  Projected Follow-up: PT-Home health PT, Other (comment)(S for mobility/OOB), OT-  Home health OT, 24 hour supervision/assistance, SLP-  Projected Equipment Needs: PT-3 in 1 bedside comode, Tub/shower seat, Rolling walker with 5" wheels, OT-  , SLP-  Equipment Details: PT- , OT-TBD Patient/family involved in discharge planning: PT- Patient,  OT-Patient,  SLP-   MD ELOS:  10-14d Medical Rehab Prognosis:  Good Assessment:   84 year old right-handed female with history of hypertension, anxiety, chronic interstitial cystitis. Patient lives alone independent prior to admission. Two-level home with ramped entrance. Patient presented 04/14/2019 with lower extremity weakness. Blood pressure 170/79. Admission chemistries unremarkable, urine drug screen negative, urinalysis negative nitrite, SARS coronavirus negative. Cranial CT scan unremarkable for acute intracranial process. Patient did not receive TPA. MRI showed acute on chronic small vessel ischemia in the thalami. An 8 mm right lateral thalamic internal capsule lacuneis noted along with evidence of punctate contralateral left ventral thalamic infarction. CT angiogram of head and neck showed generalized ectasiawith mild atherosclerotic changes with 50% left supraclinoid ICA stenosis only. Echocardiogram with ejection fraction of 65% without emboli. Aspirin Plavix added for CVA prophylaxis x3 weeks then Plavix alone. Subcutaneous Lovenox for DVT prophylaxis. Maintain on a regular diet. Therapy evaluations completed and patient was admitted for a comprehensive rehab program.   Now requiring 24/7 Rehab RN,MD, as well as CIR level PT, OT and SLP.  Treatment team will focus on ADLs and mobility with goals set at [facility] I  See Team Conference Notes for weekly updates to the plan of care

## 2019-04-22 NOTE — Progress Notes (Signed)
Team Conference Report to Patient/Family  Team Conference discussion was reviewed with the patient, including goals for Mod I, any changes in plan of care and target discharge date.  Patient expressed understanding and is in agreement.  The patient has a target discharge date of 04/29/19. Reported pain last night and MD note of adding a new neuropathic pain medication for her pain control at team conference.   Dorien Chihuahua B 04/22/2019, 1:14 PM

## 2019-04-22 NOTE — Progress Notes (Addendum)
Physical Therapy Session Note  Patient Details  Name: Brooke Collier MRN: [id number] Date of Birth: 01-27-1936  Today's Date: 04/22/2019 PT Individual Time: 9784-7841 PT Individual Time Calculation (min): 58 min   Short Term Goals: Week 1:  PT Short Term Goal 1 (Week 1): Will be able to transfer with LRAD and S PT Short Term Goal 2 (Week 1): Will score at least 40 on Berg balance test PT Short Term Goal 3 (Week 1): Will tolerate gait training at least 155f with S and LRAD  Skilled Therapeutic Interventions/Progress Updates:    Patient received in bathroom with NT present and attending; PT took over toileting assist, patient generally able to perform all functional transfers with S-min guard (based on level of fatigue) with RW today. Spent quite a bit of time educating during this session regarding fatigue following CVA as well as energy management techniques and functional applications during mobility. Tolerated multiple bouts of gait training up to 1097fwith RW and min guard but does require cues and reminders for energy conservation throughout session. Performed NMR based tasks such as reciprocal crawling to improve overall gait pattern and coordination with mod cues for correct technique, followed immediately by additional gait training and incorporation of reciprocal exercise on Nustep for 5 minutes at level 2. Continues to require ModA for transfers and gross balance without BUE support. [facility], all other needs met this morning.   Therapy Documentation Precautions:  Precautions Precautions: Fall Restrictions Weight Bearing Restrictions: No Pain: Pain Assessment Pain Scale: 0-10 Pain Score: 0-No pain Faces Pain Scale: No hurt    Therapy/Group: Individual Therapy   KrWindell NorfolkDPT, PN1   Supplemental Physical Therapist [place]  Pager [phone] [phone]  04/22/2019, 10:49 AM

## 2019-04-22 NOTE — Progress Notes (Addendum)
Occupational Therapy Session Note  Patient Details  Name: Brooke Collier MRN: [id number] Date of Birth: 01-Feb-1936  Today's Date: 04/22/2019 OT Individual Time: 7800-4471 OT Individual Time Calculation (min): 76 min    Short Term Goals: Week 1:  OT Short Term Goal 1 (Week 1): STG=LTG  Skilled Therapeutic Interventions/Progress Updates:  Patient met seated in wc in agreement with OT treatment session. Patient c/o pain in B knees at 4/10 on pain scale. PA notified with recommendation for voltaren cream for pain management. Patient able to ambulation to commode in bathroom without use of DME and Min A.Patient performed toileting with Min A for clothing management in standing. Total assist for transport to therapy gym for time management. Dynamic balance activity with and without DME with incorporation of LUE strength/coordination in sitting/standing. Patient able to bend/reach to retrieve items on floor with good safety awareness, Min A for safety, and use of RW. Patient self-reports improvement in LUE coordination with functional activities. BUE arm ergometer on level 5 with RPM maintained between 20 and 23 for 3 min. Second trial on level 5 with RPM maintained between 17 and 20 for 3 min in counter clockwise with focus on posture and strength of shoulder flexors. Patient reporting increased fatigue from previous therapy sessions this date. Patient transported back to room and left lying supine in bed with call bell within reach, bed alarm activated, and all needs met.    Therapy Documentation Precautions:  Precautions Precautions: Fall Restrictions Weight Bearing Restrictions: No Pain:   Therapy/Group: Individual Therapy  Brooke Collier 04/22/2019, 2:01 PM

## 2019-04-22 NOTE — Progress Notes (Signed)
[place] PHYSICAL MEDICINE & REHABILITATION PROGRESS NOTE   Subjective/Complaints:  No issues overnite   ROS- denies cp SOB, N/V/D Objective:   No results found. Recent Labs    04/19/19 1612 04/20/19 0520  WBC 4.7 3.7*  HGB 13.1 12.3  HCT 38.7 36.5  PLT 223 203   Recent Labs    04/19/19 1612 04/20/19 0520  NA  --  139  K  --  3.5  CL  --  104  CO2  --  26  GLUCOSE  --  95  BUN  --  23  CREATININE 0.95 0.76  CALCIUM  --  8.6*    Intake/Output Summary (Last 24 hours) at 04/22/2019 0851 Last data filed at 04/22/2019 0834 Gross per 24 hour  Intake 960 ml  Output --  Net 960 ml     Physical Exam: Vital Signs Blood pressure (!) 151/79, pulse 69, temperature 97.6 F (36.4 C), temperature source Oral, resp. rate 16, height 5\' 4"  (1.626 m), weight 44.1 kg, SpO2 100 %.  General: No acute distress Mood and affect are appropriate Heart: Regular rate and rhythm no rubs murmurs or extra sounds Lungs: Clear to auscultation, breathing unlabored, no rales or wheezes Abdomen: Positive bowel sounds, soft nontender to palpation, nondistended Extremities: No clubbing, cyanosis, or edema Skin: No evidence of breakdown, no evidence of rash Neurologic: Cranial nerves II through XII intact, motor strength is 5/5 in RIght and 4/5 left deltoid, bicep, tricep, grip, hip flexor, knee extensors, ankle dorsiflexor and plantar flexor Sensory exam normal sensation to light touch and proprioception in bilateral upper and lower extremities Cerebellar exam normal finger to nose to finger as well as heel to shin in bilateral upper and lower extremities Musculoskeletal: Full range of motion in all 4 extremities. No joint swelling    Assessment/Plan: 1. 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  Physiatrist is providing close team supervision and 24 hour management  of active medical problems listed below.  Physiatrist and rehab team continue to assess barriers to discharge/monitor patient progress toward functional and medical goals  Care Tool:  Bathing    Body parts bathed by patient: Right arm, Left arm, Chest, Abdomen, Front perineal area, Buttocks, Right upper leg, Left upper leg, Right lower leg, Left lower leg, Face         Bathing assist Assist Level: Minimal Assistance - Patient > 75%     Upper Body Dressing/Undressing Upper body dressing   What is the patient wearing?: Button up shirt    Upper body assist Assist Level: Independent    Lower Body Dressing/Undressing Lower body dressing      What is the patient wearing?: Pants     Lower body assist Assist for lower body dressing: Independent     Toileting Toileting    Toileting assist Assist for toileting: Independent with assistive device Assistive Device Comment: walker   Transfers Chair/bed transfer  Transfers assist     Chair/bed transfer assist level: Contact Guard/Touching assist     Locomotion Ambulation   Ambulation assist      Assist level: Contact Guard/Touching assist Assistive device: Walker-rolling Max distance: 50'   Walk 10 feet activity   Assist     Assist level: Contact Guard/Touching assist Assistive device: Walker-rolling   Walk 50 feet activity   Assist    Assist level: Contact Guard/Touching assist Assistive device: Walker-rolling  Walk 150 feet activity   Assist Walk 150 feet activity did not occur: Safety/medical concerns(fatigue)         Walk 10 feet on uneven surface  activity   Assist Walk 10 feet on uneven surfaces activity did not occur: Safety/medical concerns(fatigue)         Wheelchair     Assist Will patient use wheelchair at discharge?: No   Wheelchair activity did not occur: N/A         Wheelchair 50 feet with 2 turns activity    Assist    Wheelchair 50 feet with 2 turns  activity did not occur: N/A       Wheelchair 150 feet activity     Assist  Wheelchair 150 feet activity did not occur: N/A       Blood pressure (!) 151/79, pulse 69, temperature 97.6 F (36.4 C), temperature source Oral, resp. rate 16, height 5\' 4"  (1.626 m), weight 44.1 kg, SpO2 100 %.  Medical Problem List and Plan: 1.Lower extremity weaknesssecondary to bilateral thalamic lacunar infarct secondary to small vessel disease -patient may shower, team conf in am  -ELOS/Goals: 12-16d , supervision to [name] 2. Antithrombotics: -DVT/anticoagulation:Lovenox- PLT normal  -antiplatelet therapy: Aspirin 81 mg daily, Plavix 75 mg daily x3 weeks then Plavix alone 3. Pain Management:Tylenol as needed, start qhs gabapentin for burning pain LUE , pyridium for urine 4. Mood:BuSpar 5 mg twice daily, Xanax as needed took 0.5mg  1/2 tab qnoon and q 5pm at home -antipsychotic agents: N/A 5. Neuropsych: This patientiscapable of making decisions on herown behalf. 6. Skin/Wound Care:Routine skin checks 7. Fluids/Electrolytes/Nutrition:Routine in and outs with follow-up , BMET normal 8. Hypertension. Norvasc 2.5 mg daily,Avapro 150 mg daily. Monitor with increased mobility  Vitals:   04/21/19 1937 04/22/19 0429  BP: 133/65 (!) 151/79  Pulse: (!) 58 69  Resp: 16 16  Temp: 98.3 F (36.8 C) 97.6 F (36.4 C)  SpO2: 100% 100%  labile , will monitor for orthostasis 9. Hyperlipidemia. Lipitor 10. Chronic interstitial cystitis. Latest urinalysis negative nitrite.Improved freq, burning on pyridium     LOS: 3 days A FACE TO FACE EVALUATION WAS PERFORMED  Charlett Blake 04/22/2019, 8:51 AM

## 2019-04-23 ENCOUNTER — Inpatient Hospital Stay (HOSPITAL_COMMUNITY): Payer: Medicare HMO | Admitting: Physical Therapy

## 2019-04-23 ENCOUNTER — Inpatient Hospital Stay (HOSPITAL_COMMUNITY): Payer: Medicare HMO | Admitting: Occupational Therapy

## 2019-04-23 ENCOUNTER — Encounter (HOSPITAL_COMMUNITY): Payer: Medicare HMO | Admitting: Psychology

## 2019-04-23 MED ORDER — OXYBUTYNIN CHLORIDE 5 MG PO TABS
2.5000 mg | ORAL_TABLET | Freq: Every day | ORAL | Status: DC
  Administered 2019-04-23 – 2019-04-26 (×4): 2.5 mg via ORAL
  Filled 2019-04-23 (×4): qty 1

## 2019-04-23 NOTE — Progress Notes (Signed)
[place] PHYSICAL MEDICINE & REHABILITATION PROGRESS NOTE   Subjective/Complaints:  Up with bladder last noc , discussed that she must request pyridium   ROS- denies cp SOB, N/V/D Objective:   No results found. No results for input(s): WBC, HGB, HCT, PLT in the last 72 hours. No results for input(s): NA, K, CL, CO2, GLUCOSE, BUN, CREATININE, CALCIUM in the last 72 hours.  Intake/Output Summary (Last 24 hours) at 04/23/2019 0802 Last data filed at 04/22/2019 1708 Gross per 24 hour  Intake 720 ml  Output --  Net 720 ml     Physical Exam: Vital Signs Blood pressure (!) 166/78, pulse (!) 57, temperature (!) 97.5 F (36.4 C), temperature source Oral, resp. rate 16, height 5\' 4"  (1.626 m), weight 44.1 kg, SpO2 99 %.   General: No acute distress Mood and affect are appropriate Heart: Regular rate and rhythm no rubs murmurs or extra sounds Lungs: Clear to auscultation, breathing unlabored, no rales or wheezes Abdomen: Positive bowel sounds, soft nontender to palpation, nondistended Extremities: No clubbing, cyanosis, or edema Skin: No evidence of breakdown, no evidence of rash   Neurologic: Cranial nerves II through XII intact, motor strength is 5/5 in RIght and 4/5 left deltoid, bicep, tricep, grip, hip flexor, knee extensors, ankle dorsiflexor and plantar flexor Sensory exam normal sensation to light touch and proprioception in bilateral upper and lower extremities Cerebellar exam Normal RIght FNF and H-S, Mild dysmetria on Left side  Musculoskeletal: Full range of motion in all 4 extremities. No joint swelling    Assessment/Plan: 1. 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  Physiatrist is providing close team supervision and 24 hour management of active medical problems listed below.  Physiatrist and rehab team continue to assess barriers to  discharge/monitor patient progress toward functional and medical goals  Care Tool:  Bathing    Body parts bathed by patient: Right arm, Left arm, Chest, Abdomen, Front perineal area, Buttocks, Right upper leg, Left upper leg, Right lower leg, Left lower leg, Face         Bathing assist Assist Level: Minimal Assistance - Patient > 75%     Upper Body Dressing/Undressing Upper body dressing   What is the patient wearing?: Button up shirt    Upper body assist Assist Level: Independent    Lower Body Dressing/Undressing Lower body dressing      What is the patient wearing?: Pants     Lower body assist Assist for lower body dressing: Independent     Toileting Toileting    Toileting assist Assist for toileting: Supervision/Verbal cueing Assistive Device Comment: walker   Transfers Chair/bed transfer  Transfers assist     Chair/bed transfer assist level: Minimal Assistance - Patient > 75%(no AD)     Locomotion Ambulation   Ambulation assist      Assist level: Minimal Assistance - Patient > 75% Assistive device: No Device Max distance: 77ft   Walk 10 feet activity   Assist     Assist level: Minimal Assistance - Patient > 75% Assistive device: No Device   Walk 50 feet activity   Assist    Assist level: Minimal Assistance - Patient > 75% Assistive device: No Device    Walk 150 feet activity   Assist Walk 150 feet activity did not occur: Safety/medical concerns(fatigue)  Assist level: Contact Guard/Touching assist Assistive device: Walker-rolling    Walk 10 feet on uneven  surface  activity   Assist Walk 10 feet on uneven surfaces activity did not occur: Safety/medical concerns(fatigue)   Assist level: Minimal Assistance - Patient > 75% Assistive device: Hand held assist   Wheelchair     Assist Will patient use wheelchair at discharge?: No   Wheelchair activity did not occur: N/A         Wheelchair 50 feet with 2 turns  activity    Assist    Wheelchair 50 feet with 2 turns activity did not occur: N/A       Wheelchair 150 feet activity     Assist  Wheelchair 150 feet activity did not occur: N/A       Blood pressure (!) 166/78, pulse (!) 57, temperature (!) 97.5 F (36.4 C), temperature source Oral, resp. rate 16, height 5\' 4"  (1.626 m), weight 44.1 kg, SpO2 99 %.  Medical Problem List and Plan: 1.Lower extremity weaknesssecondary to bilateral thalamic lacunar infarct secondary to small vessel disease CIR PT, OT, ELOS 3/18 2. Antithrombotics: -DVT/anticoagulation:Lovenox- PLT normal  -antiplatelet therapy: Aspirin 81 mg daily, Plavix 75 mg daily x3 weeks then Plavix alone 3. Pain Management:Tylenol as needed, start qhs gabapentin for burning pain LUE , pyridium for urine, add ditropan for freq at noc  4. Mood:BuSpar 5 mg twice daily, Xanax as needed took 0.5mg  1/2 tab qnoon and q 5pm at home -antipsychotic agents: N/A 5. Neuropsych: This patientiscapable of making decisions on herown behalf. 6. Skin/Wound Care:Routine skin checks 7. Fluids/Electrolytes/Nutrition:Routine in and outs with follow-up , BMET normal 8. Hypertension. Norvasc 2.5 mg daily,Avapro 150 mg daily. Monitor with increased mobility  Vitals:   04/22/19 1949 04/23/19 0501  BP: 133/63 (!) 166/78  Pulse: (!) 52 (!) 57  Resp: 16 16  Temp: 98.1 F (36.7 C) (!) 97.5 F (36.4 C)  SpO2: 98% 99%  labile , will monitor for orthostasis 9. Hyperlipidemia. Lipitor 10. Chronic interstitial cystitis. Latest urinalysis negative nitrite.Improved freq, burning on pyridium     LOS: 4 days A FACE TO FACE EVALUATION WAS PERFORMED  Charlett Blake 04/23/2019, 8:02 AM

## 2019-04-23 NOTE — Progress Notes (Signed)
Occupational Therapy Session Note  Patient Details  Name: Lashundra W Sanpedro MRN: [id number] Date of Birth: 07/17/1935  Today's Date: 04/23/2019 OT Individual Time: 1100-1200 OT Individual Time Calculation (min): 60 min    Short Term Goals: Week 1:  OT Short Term Goal 1 (Week 1): STG=LTG  Skilled Therapeutic Interventions/Progress Updates:  Patient met seated on commode in bathroom with NT present. Patient in agreement with treatment session requesting to complete shower. Skilled OT focused on activity tolerance, standing balance, and self-care re-education as detailed below. Toileting/hygiene/clothing management with CGA and use of grab bar for clothing management in standing. Patient able to gather ADL items in room with Min A without use of DME from low dresser drawers. Patient reports increased confidence with dynamic standing balance during ADL tasks demonstrating increased comfort with bathing in standing to wash perineum and buttocks. Patient demonstrating increased activity tolerance with ability to complete bathing/dressing with fewer seated rest breaks. Patient left seated in wc with belt alarm activated, call bell within reach and all needs met.   Therapy Documentation Precautions:  Precautions Precautions: Fall Restrictions Weight Bearing Restrictions: No Pain:    Therapy/Group: Individual Therapy  Destanae R Howerton-Davis 04/23/2019, 7:42 AM 

## 2019-04-23 NOTE — Progress Notes (Signed)
Physical Therapy Session Note  Patient Details  Name: Brooke Collier MRN: [id number] Date of Birth: [birth year]  Today's Date: 04/23/2019 PT Individual Time: 0807-0905 PT Individual Time Calculation (min): 58 min   Short Term Goals: Week 1:  PT Short Term Goal 1 (Week 1): Will be able to transfer with LRAD and S PT Short Term Goal 2 (Week 1): Will score at least 40 on Berg balance test PT Short Term Goal 3 (Week 1): Will tolerate gait training at least 131ft with S and LRAD  Skilled Therapeutic Interventions/Progress Updates:   Pt received sitting on toilet with RN present and pt agreeable to therapy session. Pt reports she was up/down multiple times last night due to bladder pain and frequent urination resulting in limited sleep. Performed seated peri-care without assist. Sit>stand toilet>RW with close supervision/CGA for safety/steadying using grab bar - pulled pants over hips with CGA for steadying but no assist for LB clothing management. Gait ~10ft to bathroom sink using RW with close supervision/CGA for steadying. Standing hand hygiene at sink with close supervision/CGA for safety. Sitting EOB performed UB and LB dressing with increased time for motor planning/sequencing of task and close supervision/CGA for steadying during sit<>stands using RW to complete LB dressing. Gait training ~267ft to main therapy gym using RW with CGA/close supervision for safety - continues to demonstrate decreased gait speed with impaired L LE motor planning. Gait training ~84ft x3 without AD (no breaks) including turning all with min assist for balance - demonstrates guarded trunk with limited arm swing, impaired L LE motor planning due to anxiety/fear of falling, and inconsistent stepping - these were improving with increased repetitions. Lateral side stepping at hallway rail down/back x3 initially with UE support progressed to no UE support with min assist for balance. Ascended/descended 8 (3" steps)  x3 trails  using 1 HR with cuing for reciprocal pattern to target improved motor planning and dynamic standing balance all with CGA for steadying. Gait training ~170ft back to room, no AD, with min assist for balance and therapist facilitating increased L UE arm swing - pt demonstrating improving symmetry in gait with improving L LE motor planning/motor control although continues to have guarded trunk movement. Pt left seated in w/c with needs in reach and seat belt alarm on.   Therapy Documentation Precautions:  Precautions Precautions: Fall Restrictions Weight Bearing Restrictions: No  Pain:   Denies pain during session. When RN present pt deferred Voltaren gel on knees and denies pain at end of session.  Therapy/Group: Individual Therapy  Tawana Scale, PT, DPT 04/23/2019, 7:53 AM

## 2019-04-23 NOTE — Progress Notes (Signed)
PtPhysical Therapy Session Note  Patient Details  Name: Brooke Collier MRN: [id number] Date of Birth: 1935-12-29  Today's Date: 04/23/2019 PT Individual Time: [id number] PT Individual Time Calculation (min): 73 min   Short Term Goals: Week 1:  PT Short Term Goal 1 (Week 1): Will be able to transfer with LRAD and S PT Short Term Goal 2 (Week 1): Will score at least 40 on Berg balance test PT Short Term Goal 3 (Week 1): Will tolerate gait training at least 1104ft with S and LRAD  Skilled Therapeutic Interventions/Progress Updates:  Pt received sitting on toilet. CGA provided for support in standing as pt pulls up pants. Pt stands at sink for handwashing with supervision for safety. WC transport provided to therapy gym for energy conservation.  Pt stands from [facility] with CGA and remains in standing to don harness for LiteGait with BUE support on litegait. MinA to facilitate stepping onto treadmill. Pt performs x3 bouts of locomotor training on treadmill with Litegait harness for safety but no BWS provided.   1st bout 1.4 mph for 44min38sec with BUEs on handrails for 448'. Pt demonstrates L sided toe drag with fatigue.  2nd bout starting at 0.8 mph progressing to 1.4 mph for 55min31sec for 351' without use of BUEs. MinA manual cuing provided at hips and verbal cues for increased step height and stride length on L side.  3rd bout 0.8 mph progressing to 1.1 mph for 11min12sec for 315' without BUE support. MinA provided at hips.Verbal and manual cuing for reciprocal arm swing.   MinA for backward gait and to step down from treadmill while in litegait harness. Verbal cues for positioning and hand placement for safe transfer to WC.  Pt performs gait training additional 300' without AD and minA manual support at hips. Verbal cues to challenge dynamic balance including spontaneous stopping, direction changes, sidestepping, and backward gait x10'. Pt picks up object off floor with minA.   At end of  session pt left in room, seated in w/c with needs in reach and seat belt alarm on.  Therapy Documentation Precautions:  Precautions Precautions: Fall Restrictions Weight Bearing Restrictions: No  Pain: Pt reports minimal pain in bilateral calves from TED hose. Numerical rating not provided. MD messaged and "ok" with discontinued use of TED hose.   Therapy/Group: Individual Therapy  Breck Coons, PT, DPT Page Spiro, PT, DPT 04/23/2019, 1:00 PM

## 2019-04-24 ENCOUNTER — Inpatient Hospital Stay (HOSPITAL_COMMUNITY): Payer: Medicare HMO

## 2019-04-24 ENCOUNTER — Inpatient Hospital Stay (HOSPITAL_COMMUNITY): Payer: Medicare HMO | Admitting: Speech Pathology

## 2019-04-24 MED ORDER — PHENAZOPYRIDINE HCL 100 MG PO TABS: 100.0000 mg | ORAL_TABLET | Freq: Every day | ORAL | Status: DC

## 2019-04-24 MED ORDER — POLYVINYL ALCOHOL 1.4 % OP SOLN
2.0000 [drp] | OPHTHALMIC | Status: DC | PRN
  Administered 2019-04-27: 2 [drp] via OPHTHALMIC
  Filled 2019-04-24: qty 15

## 2019-04-24 NOTE — Progress Notes (Addendum)
Physical Therapy Session Note  Patient Details  Name: Brooke Collier MRN: [id number] Date of Birth: 24-Nov-1935  Today's Date: 04/24/2019 PT Individual Time: [id number] PT Individual Time Calculation (min): 72 min   Short Term Goals: Week 1:  PT Short Term Goal 1 (Week 1): Will be able to transfer with LRAD and S PT Short Term Goal 2 (Week 1): Will score at least 40 on Berg balance test PT Short Term Goal 3 (Week 1): Will tolerate gait training at least 127ft with S and LRAD Week 2:    Week 3:     Skilled Therapeutic Interventions/Progress Updates:     PAIN denies pain this am  Pt initially seated on EOB w/NT.  Pt midway thru lower body dressing.  STS w/RW W/cga and raises pants from thighs w/cga.  Pt removes undershirt and donns clean shirt and sweater w/supervision.  Therapist total assist for shoes for time management purpose.  SPT to wc w/cga. Discussed use of LiteGait and educated pt regarding benefits, but pt declining at this time stating mult reasons, apparently inhibited by anxiety. Wc propulsion x 15ft, 164ft to gym  for hs strengthening and bipedal activity.  Gait 193ft w/RW w/cga, cues to LLE clearance, performed as warm up activity, pt anxious about session/conservative activity for improved pt confidence/comfort w/therapist.   Sidestepping 22ft L w/cga and cues for clearance, 47ft to R w/cga retrogait x 72ft w/min assist, cues for LLE clearance  Dynamic balance: Alternating tappin 5inc step w/cga to min assist x 25 each Standing ball toss into rebounder x 2 min w/cga only, no balance loss, good UE coordination.  Gait x 41ft carrying weighted bucket/increased difficulty w/RvsL hand, w/cga for balance and improved step length and LLE clearance noted. Gait 115ft again carrying bucket, wincreased distance/fatigue LLE clearance and step length deteriorate. Repeated STS holding weighted basket x 8 for isolated LE strengthening and core activation. wc propulsion 186ft  w/bilat LEs for hs strengthening and bipedal activity.  Pt transported to room at end of session. Pt left oob in wc w/alarm belt set and needs in reach    Therapy Documentation Precautions:  Precautions Precautions: Fall Restrictions Weight Bearing Restrictions: No  Therapy/Group: Individual Therapy  Callie Fielding, [place] 04/24/2019, 12:58 PM

## 2019-04-24 NOTE — Progress Notes (Signed)
Occupational Therapy Session Note  Patient Details  Name: Brooke Collier MRN: [id number] Date of Birth: May 07, 1935  Today's Date: 04/24/2019 OT Individual Time: 1300-1400 OT Individual Time Calculation (min): 60 min    Short Term Goals: Week 1:  OT Short Term Goal 1 (Week 1): STG=LTG  Skilled Therapeutic Interventions/Progress Updates:    Pt reiceved in bed agreeable to OT with no pain reported. Pt completes functional mobility to toilet with RW and CGA for transfer and 3/3 components of toileting in standing. Pt able to wash hands in standing with CGA and transfer back to EOB to change clothing items with S for UB dressing and CGA for LB dressing. Pt declined bathing this date. Total A transport in hallway for time management. In [place] apartment pt completes sweeping of floor with broom with CGA and educated pt on energy conservation strategies for cooking and washing dishes perched on stool pt completes changing bed sheets at ambulatory level with no AD and VC for upright posture when negotiating around corners instead of stooping and leaning on the bed. Pt completes functional mobility back to room with RW and 1 standing rest break and seated rest break with VC for decreasing L toe drag/larger step length with L. Exited session with pt seated in bed, exit alarm on and call light inr each  Therapy Documentation Precautions:  Precautions Precautions: Fall Restrictions Weight Bearing Restrictions: No General:   Vital Signs: Therapy Vitals BP: 134/71 Pain: Pain Assessment Pain Score: 1  ADL: ADL Grooming: Setup Where Assessed-Grooming: Sitting at sink Upper Body Bathing: Setup Where Assessed-Upper Body Bathing: Shower Lower Body Bathing: Minimal assistance Where Assessed-Lower Body Bathing: Shower Upper Body Dressing: Supervision/safety Where Assessed-Upper Body Dressing: Other (Comment)(sitting) Lower Body Dressing: Supervision/safety Where Assessed-Lower Body Dressing:  Chair Toileting: Minimal assistance Where Assessed-Toileting: Glass blower/designer: Psychiatric nurse Method: Arts development officer: Energy manager: Medical sales representative Method: Librarian, academic: Radio broadcast assistant, Grab bars, Walk in Facilities manager Transfer: Environmental education officer Method: Surveyor, mining    Praxis   Exercises:   Other Treatments:     Therapy/Group: Individual Therapy  Tonny Branch 04/24/2019, 1:07 PM

## 2019-04-24 NOTE — Progress Notes (Signed)
[place] PHYSICAL MEDICINE & REHABILITATION PROGRESS NOTE   Subjective/Complaints:  Pt seen this AM- walking with PT_ doing better with weighted objects carrying them, for core strength.   Said sleeping poorly since getting up with bladder all night- needs to ask for pyridium-  Will make nightly for the next few night since pt forgets to ask for it.   Also notes some pain in knees- creme is helpful for that.  Also notes eyes are very dry- asking for drops.    ROS- Pt denies SOB, abd pain, CP, N/V/C/D, and vision changes  Objective:   No results found. No results for input(s): WBC, HGB, HCT, PLT in the last 72 hours. No results for input(s): NA, K, CL, CO2, GLUCOSE, BUN, CREATININE, CALCIUM in the last 72 hours.  Intake/Output Summary (Last 24 hours) at 04/24/2019 1845 Last data filed at 04/24/2019 1300 Gross per 24 hour  Intake 960 ml  Output --  Net 960 ml     Physical Exam: Vital Signs Blood pressure (!) 110/56, pulse (!) 59, temperature 98.3 F (36.8 C), resp. rate 18, height 5\' 4"  (1.626 m), weight 44.1 kg, SpO2 95 %.   General: No acute distress; up in gym walking with weighted objects with PT, NAD HEENT_ dry eyes; dry mouth Mood and affect are appropriate Heart: RRR Lungs: CTA b/l Abdomen: soft, NT; ND, (+)BS hypoactive Extremities: No clubbing, cyanosis, or edema Skin: No evidence of breakdown, no evidence of rash   Neurologic: Cranial nerves II through XII intact, motor strength is 5/5 in RIght and 4/5 left deltoid, bicep, tricep, grip, hip flexor, knee extensors, ankle dorsiflexor and plantar flexor Sensory exam normal sensation to light touch and proprioception in bilateral upper and lower extremities Cerebellar exam Normal RIght FNF and H-S, Mild dysmetria on Left side  Musculoskeletal: Full range of motion in all 4 extremities. No joint swelling    Assessment/Plan: 1. Functional deficits secondary to Bilateral R>L thalamic infarct which require 3+  hours per day of interdisciplinary therapy in a comprehensive inpatient rehab setting.  Physiatrist is providing close team supervision and 24 hour management of active medical problems listed below.  Physiatrist and rehab team continue to assess barriers to discharge/monitor patient progress toward functional and medical goals  Care Tool:  Bathing    Body parts bathed by patient: Right arm, Left arm, Chest, Abdomen, Front perineal area, Buttocks, Right upper leg, Left upper leg, Right lower leg, Left lower leg, Face         Bathing assist Assist Level: Minimal Assistance - Patient > 75%(Sitting/standing in walk-in shower)     Upper Body Dressing/Undressing Upper body dressing   What is the patient wearing?: Pull over shirt, Bra    Upper body assist Assist Level: Set up assist    Lower Body Dressing/Undressing Lower body dressing      What is the patient wearing?: Pants, Underwear/pull up     Lower body assist Assist for lower body dressing: Contact Guard/Touching assist     Toileting Toileting    Toileting assist Assist for toileting: Contact Guard/Touching assist Assistive Device Comment: RW   Transfers Chair/bed transfer  Transfers assist     Chair/bed transfer assist level: Contact Guard/Touching assist     Locomotion Ambulation   Ambulation assist      Assist level: Minimal Assistance - Patient > 75% Assistive device: No Device Max distance: 300   Walk 10 feet activity   Assist     Assist level: Minimal Assistance -  Patient > 75% Assistive device: No Device   Walk 50 feet activity   Assist    Assist level: Minimal Assistance - Patient > 75% Assistive device: No Device    Walk 150 feet activity   Assist Walk 150 feet activity did not occur: Safety/medical concerns(fatigue)  Assist level: Minimal Assistance - Patient > 75% Assistive device: No Device    Walk 10 feet on uneven surface  activity   Assist Walk 10 feet on  uneven surfaces activity did not occur: Safety/medical concerns(fatigue)   Assist level: Minimal Assistance - Patient > 75% Assistive device: Hand held assist   Wheelchair     Assist Will patient use wheelchair at discharge?: No   Wheelchair activity did not occur: N/A         Wheelchair 50 feet with 2 turns activity    Assist    Wheelchair 50 feet with 2 turns activity did not occur: N/A       Wheelchair 150 feet activity     Assist  Wheelchair 150 feet activity did not occur: N/A       Blood pressure (!) 110/56, pulse (!) 59, temperature 98.3 F (36.8 C), resp. rate 18, height 5\' 4"  (1.626 m), weight 44.1 kg, SpO2 95 %.  Medical Problem List and Plan: 1.Lower extremity weaknesssecondary to bilateral thalamic lacunar infarct secondary to small vessel disease CIR PT, OT, ELOS 3/18 2. Antithrombotics: -DVT/anticoagulation:Lovenox- PLT normal  -antiplatelet therapy: Aspirin 81 mg daily, Plavix 75 mg daily x3 weeks then Plavix alone 3. Pain Management:Tylenol as needed, start qhs gabapentin for burning pain LUE , pyridium for urine, add ditropan for freq at noc   3/13- will make pyridium nightly 4. Mood:BuSpar 5 mg twice daily, Xanax as needed took 0.5mg  1/2 tab qnoon and q 5pm at home -antipsychotic agents: N/A 5. Neuropsych: This patientiscapable of making decisions on herown behalf. 6. Skin/Wound Care:Routine skin checks 7. Fluids/Electrolytes/Nutrition:Routine in and outs with follow-up , BMET normal 8. Hypertension. Norvasc 2.5 mg daily,Avapro 150 mg daily. Monitor with increased mobility  Vitals:   04/24/19 0929 04/24/19 1414  BP: 134/71 (!) 110/56  Pulse:  (!) 59  Resp:  18  Temp:  98.3 F (36.8 C)  SpO2:  95%  labile , will monitor for orthostasis  3/13- better controlled- con't meds 9. Hyperlipidemia. Lipitor 10. Chronic interstitial cystitis. Latest urinalysis negative nitrite.Improved  freq, burning on pyridium   11. Dry eyes- ordered artifical tears prn for dry eyes- wanted prn.     LOS: 5 days A FACE TO FACE EVALUATION WAS PERFORMED  Isidor Bromell 04/24/2019, 6:45 PM

## 2019-04-25 MED ORDER — FLAVOXATE HCL 100 MG PO TABS
100.0000 mg | ORAL_TABLET | Freq: Two times a day (BID) | ORAL | Status: DC | PRN
  Administered 2019-04-26 – 2019-04-27 (×2): 100 mg via ORAL
  Filled 2019-04-25 (×3): qty 1

## 2019-04-25 NOTE — Progress Notes (Signed)
[place] PHYSICAL MEDICINE & REHABILITATION PROGRESS NOTE   Subjective/Complaints:  Pt reports used pyridium at home, but wasn't that effective- asking for something else- will try [facility]- she has never tried. And feels like having bladder spasms, so wants to try.   H2O is "her friend" and caffeine and acids are bad for her, per pt- she recognizes what to avoid in interstitial cystitis.    ROS-  Pt denies SOB, abd pain, CP, N/V/C/D, and vision changes   Objective:   No results found. No results for input(s): WBC, HGB, HCT, PLT in the last 72 hours. No results for input(s): NA, K, CL, CO2, GLUCOSE, BUN, CREATININE, CALCIUM in the last 72 hours.  Intake/Output Summary (Last 24 hours) at 04/25/2019 1414 Last data filed at 04/25/2019 1308 Gross per 24 hour  Intake 920 ml  Output -  Net 920 ml     Physical Exam: Vital Signs Blood pressure 135/71, pulse 67, temperature 98.1 F (36.7 C), resp. rate 18, height 5\' 4"  (1.626 m), weight 44.1 kg, SpO2 100 %.   General:lying in bed with blanket to neck, appropriate, NAD; frail appearing,  HEENT- still dry mouth; conjugate gaze Mood and affect are appropriate Heart: RRR; no MR/G, no JVD Lungs: CTA B/L- no W/R/R- good air movement Abdomen: Soft, NT, ND, (+)BS  Extremities: No clubbing, cyanosis, or edema Skin: No evidence of breakdown, no evidence of rash Neurologic: Cranial nerves II through XII intact, motor strength is 5/5 in RIght and 4/5 left deltoid, bicep, tricep, grip, hip flexor, knee extensors, ankle dorsiflexor and plantar flexor Sensory exam normal sensation to light touch and proprioception in bilateral upper and lower extremities Cerebellar exam Normal RIght FNF and H-S, Mild dysmetria on Left side  Musculoskeletal: Full range of motion in all 4 extremities. No joint swelling    Assessment/Plan: 1. Functional deficits secondary to Bilateral R>L thalamic infarct which require 3+ hours per day of interdisciplinary  therapy in a comprehensive inpatient rehab setting.  Physiatrist is providing close team supervision and 24 hour management of active medical problems listed below.  Physiatrist and rehab team continue to assess barriers to discharge/monitor patient progress toward functional and medical goals  Care Tool:  Bathing    Body parts bathed by patient: Right arm, Left arm, Chest, Abdomen, Front perineal area, Buttocks, Right upper leg, Left upper leg, Right lower leg, Left lower leg, Face         Bathing assist Assist Level: Minimal Assistance - Patient > 75%(Sitting/standing in walk-in shower)     Upper Body Dressing/Undressing Upper body dressing   What is the patient wearing?: Pull over shirt, Bra    Upper body assist Assist Level: Set up assist    Lower Body Dressing/Undressing Lower body dressing      What is the patient wearing?: Pants, Underwear/pull up     Lower body assist Assist for lower body dressing: Contact Guard/Touching assist     Toileting Toileting    Toileting assist Assist for toileting: Contact Guard/Touching assist Assistive Device Comment: RW   Transfers Chair/bed transfer  Transfers assist     Chair/bed transfer assist level: Contact Guard/Touching assist     Locomotion Ambulation   Ambulation assist      Assist level: Minimal Assistance - Patient > 75% Assistive device: No Device Max distance: 300   Walk 10 feet activity   Assist     Assist level: Minimal Assistance - Patient > 75% Assistive device: No Device   Walk 50  feet activity   Assist    Assist level: Minimal Assistance - Patient > 75% Assistive device: No Device    Walk 150 feet activity   Assist Walk 150 feet activity did not occur: Safety/medical concerns(fatigue)  Assist level: Minimal Assistance - Patient > 75% Assistive device: No Device    Walk 10 feet on uneven surface  activity   Assist Walk 10 feet on uneven surfaces activity did not occur:  Safety/medical concerns(fatigue)   Assist level: Minimal Assistance - Patient > 75% Assistive device: Hand held assist   Wheelchair     Assist Will patient use wheelchair at discharge?: No   Wheelchair activity did not occur: N/A         Wheelchair 50 feet with 2 turns activity    Assist    Wheelchair 50 feet with 2 turns activity did not occur: N/A       Wheelchair 150 feet activity     Assist  Wheelchair 150 feet activity did not occur: N/A       Blood pressure 135/71, pulse 67, temperature 98.1 F (36.7 C), resp. rate 18, height 5\' 4"  (1.626 m), weight 44.1 kg, SpO2 100 %.  Medical Problem List and Plan: 1.Lower extremity weaknesssecondary to bilateral thalamic lacunar infarct secondary to small vessel disease CIR PT, OT, ELOS 3/18 2. Antithrombotics: -DVT/anticoagulation:Lovenox- PLT normal  -antiplatelet therapy: Aspirin 81 mg daily, Plavix 75 mg daily x3 weeks then Plavix alone 3. Pain Management:Tylenol as needed, start qhs gabapentin for burning pain LUE , pyridium for urine, add ditropan for freq at noc   3/13- will make pyridium nightly  3/14- got call- pharmacy concerned about dose but explained tok at home- tried, but no good effect; will d/c and try Urispas BID prn- pt has to ask for it.  4. Mood:BuSpar 5 mg twice daily, Xanax as needed took 0.5mg  1/2 tab qnoon and q 5pm at home -antipsychotic agents: N/A 5. Neuropsych: This patientiscapable of making decisions on herown behalf. 6. Skin/Wound Care:Routine skin checks 7. Fluids/Electrolytes/Nutrition:Routine in and outs with follow-up , BMET normal 8. Hypertension. Norvasc 2.5 mg daily,Avapro 150 mg daily. Monitor with increased mobility  Vitals:   04/25/19 0801 04/25/19 1318  BP: (!) 149/56 135/71  Pulse:  67  Resp:  18  Temp:  98.1 F (36.7 C)  SpO2:  100%  labile , will monitor for orthostasis  3/13- better controlled- con't  meds  3/14- BP elevated to 149/56 x1; otherwise well controlled- cn't med- no orthostasis seen/heard about.  9. Hyperlipidemia. Lipitor 10. Chronic interstitial cystitis. Latest urinalysis negative nitrite.Improved freq, burning on pyridium   3/14- d/c'd pyridium and started urispas  11. Dry eyes- ordered artifical tears prn for dry eyes- wanted prn.  3/14- working well per pt- con't     LOS: 6 days A FACE TO [place] 04/25/2019, 2:14 PM

## 2019-04-26 ENCOUNTER — Inpatient Hospital Stay (HOSPITAL_COMMUNITY): Payer: Medicare HMO | Admitting: Occupational Therapy

## 2019-04-26 ENCOUNTER — Inpatient Hospital Stay (HOSPITAL_COMMUNITY): Payer: Medicare HMO

## 2019-04-26 LAB — CREATININE, SERUM
Creatinine, Ser: 0.82 mg/dL (ref 0.44–1.00)
GFR calc Af Amer: >60 mL/min (ref >60)
GFR calc non Af Amer: >60 mL/min (ref >60)

## 2019-04-26 NOTE — Progress Notes (Addendum)
Occupational Therapy Weekly Progress Note  Patient Details  Name: Brooke Collier MRN: [id number] Date of Birth: 29-Apr-1935  Beginning of progress report period: April 20, 2019 End of progress report period: April 26, 2019  Today's Date: 04/26/2019 OT Individual Time: 1045-1200 OT Individual Time Calculation (min): 75 min    OT Individual Time: 1445-1530 OT Individual Time Calculation (min): 45 min  Patient has met 2 of 10 long term goals.  Short term goals not set due to estimated length of stay.  Patient has made excellent progress toward BADLs, functional transfers, and functional mobility with use of DME this reporting period. Patient is able to ambulate within room and collect ADL items in prep for completion of self-care tasks with close supervision A. Patient able to doff/don UB clothing in standing and LB clothing in standing/sitting with supervision A secondary to improved static/dynamic standing balance and improved coordination. UB/LB bathing requires no more than supervision A seated on TTB.   Patient continues to demonstrate the following deficits: generalized muscle weakness, decerased activity tolerance, and acute joint pain and therefore will continue to benefit from skilled OT intervention to enhance overall performance with BADL and iADL.  See Patient's Care Plan for progression toward long term goals.  Patient progressing toward long term goals..  Continue plan of care.  Skilled Therapeutic Interventions/Progress Updates:  Session 1: Patient met seated on commode in agreement with OT treatment session. Patient c/o feeling "aching all over" with constant headache with onset this a.m.  Toileting/hygiene/clothing management with supervision A and use of RW. Patient able to complete hand washing standing at sink level with supervision A. ADL item retrieval from wardrobe with use of RW and close supervision A. Patient completed shower transfer via stand-pivot and UB/LB bathing  with supervision A. During bathing, patient noted onset of numbness in L hand and heaviness in LLE that began this a.m. RN notified. Patient educated on process of stroke rehab including peaks and valleys with patient expressing verbal understanding. UB dressing with I and LB dressing seated EOB with supervision A in sitting/standing. Patient left lying supine in bed with call bell within reach, bed alarm activated, and all needs met.   Session 2: Patient met lying supine in bed in agreement with OT treatment session although more fatigued than this a.m. Functional mobility from EOB to bathroom and toilet transfer to standard commode with close supervision A. Patient complete toileting/hygiene/clothing management and hand hygiene standing at sink level with supervision A. OT provided education on anxiety management including deep belly breathing and progressive relaxation with patient able to return demonstrate with good accuracy. Patient in agreement with short walk, ambulating ~162f down the hall and back. Patient notes anxiety about "walking right" like she's been instructed to during PT sessions. This OT explained that rehab occurs over multiple sessions covering many techniques and strategies to increase safety and independence with functional mobility. OT explained that these strategies can take some time to learn. Patient expressed verbal understanding reporting a noticeable decrease in level of anxiety. Patient left lying supine in bed with call bell within reach, bed alarm activated, and all needs met.   Therapy Documentation Precautions:  Precautions Precautions: Fall Restrictions Weight Bearing Restrictions: No   Therapy/Group: Individual Therapy  Fong Mccarry R Howerton-Davis 04/26/2019, 7:56 AM

## 2019-04-26 NOTE — Progress Notes (Signed)
Physical Therapy Session Note  Patient Details  Name: Brooke Collier MRN: [id number] Date of Birth: 02-17-1935  Today's Date: 04/26/2019 PT Individual Time: 0840-0950 PT Individual Time Calculation (min): 70 min   Short Term Goals: Week 1:  PT Short Term Goal 1 (Week 1): Will be able to transfer with LRAD and S PT Short Term Goal 2 (Week 1): Will score at least 40 on Berg balance test PT Short Term Goal 3 (Week 1): Will tolerate gait training at least 166ft with S and LRAD  Skilled Therapeutic Interventions/Progress Updates:     Gait training: Pt received in supine and performs bed mobility and sit to stand with supervision. PT provides CGA for sit to stand transfer, toilet transfer, and for standing ADLs including teeth brushing and navigating obstacles in room. Verbal cues provided to prevent reaching for furniture to stabilize. Pt ambulates 150' to therapy gym and takes seated rest break prior to LiteGait mobility re-education.  CGA to stand and don harness with minA provided at hips to stabilize during step up onto treadmill. Verbal cues for correct sequencing of ascending step. Pt given very light bodyweight support without any significant unweighting for gait trials on treadmill. x3 bouts total.  1st: 3:28  at 1.4 mph for 368' with BUE support.  2nd: 4:16 at 1.4 mph for 514' with BUE support.  - pt takes extended seated rest break following 2nd bout  3rd: 5:03 at 1.4 mph for 618'. Pt attempts ambulation without use of BUEs for support and requires modA at hips with several LOBs and L toe dragging. Majority of bout performed with BUE support.  During gait trials verbal cues provided to increase LLE step height and stride length. Cues to increase heel strike to encourage L knee extension and increased push-off through toes at terminal stance phase for more functional gait pattern.  Pt self propels WC back to room using BUEs and BLEs.  Left seated in WC with all needs in reach  and belt alarm engaged.   Therapy Documentation Precautions:  Precautions Precautions: Fall Restrictions Weight Bearing Restrictions: No   Pain:  Pt reports general "achiness" and soreness in BLEs. No numerical rating provided. PT mobilizes pt with no further complaint of pain.    Therapy/Group: Individual Therapy  Breck Coons, PT, DPT 04/26/2019, 3:47 PM

## 2019-04-26 NOTE — Progress Notes (Signed)
Occupational Therapy Session Note  Patient Details  Name: Brooke Collier MRN: [id number] Date of Birth: May 27, 1935  Today's Date: 04/26/2019 OT Individual Time: [id number] OT Individual Time Calculation (min): 32 min    Short Term Goals: Week 1:  OT Short Term Goal 1 (Week 1): STG=LTG  Skilled Therapeutic Interventions/Progress Updates:    Pt completed supine to sit EOB with supervision to start session.  She then donned her gripper socks with supervision and completed ambulation to the bathroom for use of the toilet with min guard assist and no assistive device.  She was able to complete toilet clothing management with min guard sit to stand and toilet hygiene with supervision.  Once completed, she ambulated out to the sink where she washed her hands at min guard assist in standing and then transferred to the EOB.  She was able to doff her gripper socks and donn her shoes with setup and then ambulated out to the dayroom and back with use of the RW for support.  Supervision was needed for ambulation with a resting break of 2-3 mins in the dayroom before ambulating back.  She continues to exhibit some LLE weakness with decreased efficiency with stepping as well as a history of bilateral knee pain.  Finished session with transfer back to the bed to rest and with call button in reach.  Bed alarm in place as well.    Therapy Documentation Precautions:  Precautions Precautions: Fall Restrictions Weight Bearing Restrictions: No  Pain: Pain Assessment Pain Scale: Faces Pain Score: 0-No pain ADL: See Care Tool Section for some details of mobility and selfcare tasks  Therapy/Group: Individual Therapy  Shariff Lasky OTR/L 04/26/2019, 3:55 PM

## 2019-04-26 NOTE — Progress Notes (Signed)
[place] PHYSICAL MEDICINE & REHABILITATION PROGRESS NOTE   Subjective/Complaints:   No issues overnite , bladder spasms doing a little better   ROS-  Pt denies SOB, abd pain, CP,N/V/D   Objective:   No results found. No results for input(s): WBC, HGB, HCT, PLT in the last 72 hours. Recent Labs    04/26/19 0536  CREATININE 0.82    Intake/Output Summary (Last 24 hours) at 04/26/2019 0952 Last data filed at 04/26/2019 0800 Gross per 24 hour  Intake 720 ml  Output --  Net 720 ml     Physical Exam: Vital Signs Blood pressure (!) 153/73, pulse (!) 52, temperature 97.6 F (36.4 C), temperature source Oral, resp. rate 16, height 5\' 4"  (1.626 m), weight 44.1 kg, SpO2 100 %.   General:lying in bed with blanket to neck, appropriate, NAD; frail appearing,  HEENT- still dry mouth; conjugate gaze Mood and affect are appropriate Heart: RRR; no MR/G, no JVD Lungs: CTA B/L- no W/R/R- good air movement Abdomen: Soft, NT, ND, (+)BS  Extremities: No clubbing, cyanosis, or edema Skin: No evidence of breakdown, no evidence of rash Neurologic: Cranial nerves II through XII intact, motor strength is 5/5 in RIght and 4/5 left deltoid, bicep, tricep, grip, hip flexor, knee extensors, ankle dorsiflexor and plantar flexor Sensory exam normal sensation to light touch and proprioception in bilateral upper and lower extremities Cerebellar exam Normal RIght FNF and H-S, Mild dysmetria on Left side  Musculoskeletal: Full range of motion in all 4 extremities. No joint swelling    Assessment/Plan: 1. 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  Physiatrist is providing close team supervision and 24 hour management of active medical problems listed below.  Physiatrist and rehab team continue to assess barriers to discharge/monitor patient progress toward functional and  medical goals  Care Tool:  Bathing    Body parts bathed by patient: Right arm, Left arm, Chest, Abdomen, Front perineal area, Buttocks, Right upper leg, Left upper leg, Right lower leg, Left lower leg, Face         Bathing assist Assist Level: Minimal Assistance - Patient > 75%(Sitting/standing in walk-in shower)     Upper Body Dressing/Undressing Upper body dressing   What is the patient wearing?: Pull over shirt, Bra    Upper body assist Assist Level: Set up assist    Lower Body Dressing/Undressing Lower body dressing      What is the patient wearing?: Pants, Underwear/pull up     Lower body assist Assist for lower body dressing: Contact Guard/Touching assist     Toileting Toileting    Toileting assist Assist for toileting: Contact Guard/Touching assist Assistive Device Comment: RW   Transfers Chair/bed transfer  Transfers assist     Chair/bed transfer assist level: Contact Guard/Touching assist     Locomotion Ambulation   Ambulation assist      Assist level: Minimal Assistance - Patient > 75% Assistive device: No Device Max distance: 300   Walk 10 feet activity   Assist     Assist level: Minimal Assistance - Patient > 75% Assistive device: No Device   Walk 50 feet activity   Assist    Assist level: Minimal Assistance - Patient > 75% Assistive device: No Device    Walk 150 feet activity   Assist Walk 150 feet activity did not occur: Safety/medical concerns(fatigue)  Assist level: Minimal Assistance - Patient > 75% Assistive  device: No Device    Walk 10 feet on uneven surface  activity   Assist Walk 10 feet on uneven surfaces activity did not occur: Safety/medical concerns(fatigue)   Assist level: Minimal Assistance - Patient > 75% Assistive device: Hand held assist   Wheelchair     Assist Will patient use wheelchair at discharge?: No   Wheelchair activity did not occur: N/A         Wheelchair 50 feet with 2  turns activity    Assist    Wheelchair 50 feet with 2 turns activity did not occur: N/A       Wheelchair 150 feet activity     Assist  Wheelchair 150 feet activity did not occur: N/A       Blood pressure (!) 153/73, pulse (!) 52, temperature 97.6 F (36.4 C), temperature source Oral, resp. rate 16, height 5\' 4"  (1.626 m), weight 44.1 kg, SpO2 100 %.  Medical Problem List and Plan: 1.Lower extremity weaknesssecondary to bilateral thalamic lacunar infarct secondary to small vessel disease CIR PT, OT, ELOS 3/18 2. Antithrombotics: -DVT/anticoagulation:Lovenox- PLT normal  -antiplatelet therapy: Aspirin 81 mg daily, Plavix 75 mg daily x3 weeks then Plavix alone 3. Pain Management:Tylenol as needed, start qhs gabapentin for burning pain LUE ,  Urispas for interstitial cystitis  4. Mood:BuSpar 5 mg twice daily, Xanax as needed took 0.5mg  1/2 tab qnoon and q 5pm at home -antipsychotic agents: N/A 5. Neuropsych: This patientiscapable of making decisions on herown behalf. 6. Skin/Wound Care:Routine skin checks 7. Fluids/Electrolytes/Nutrition:Routine in and outs with follow-up , BMET normal 8. Hypertension. Norvasc 2.5 mg daily,Avapro 150 mg daily. Monitor with increased mobility  Vitals:   04/26/19 0308 04/26/19 0720  BP: (!) 149/74 (!) 153/73  Pulse: (!) 52   Resp: 16   Temp:    SpO2:    labile ,  9. Hyperlipidemia. Lipitor 10. Chronic interstitial cystitis. Latest urinalysis negative nitrite.Improved freq, burning on pyridium   3/14- d/c'd pyridium and started urispas  11. Dry eyes- ordered artifical tears prn for dry eyes- wanted prn.  3/14- working well per pt- con't     LOS: 7 days A FACE TO Gregory 04/26/2019, 9:52 AM

## 2019-04-27 ENCOUNTER — Inpatient Hospital Stay (HOSPITAL_COMMUNITY): Payer: Medicare HMO | Admitting: Physical Therapy

## 2019-04-27 ENCOUNTER — Ambulatory Visit (HOSPITAL_COMMUNITY): Payer: Medicare HMO

## 2019-04-27 ENCOUNTER — Encounter (HOSPITAL_COMMUNITY): Payer: Medicare HMO | Admitting: Occupational Therapy

## 2019-04-27 MED ORDER — ROPINIROLE HCL 0.25 MG PO TABS
0.2500 mg | ORAL_TABLET | Freq: Every day | ORAL | Status: DC
  Administered 2019-04-27 – 2019-04-28 (×2): 0.25 mg via ORAL
  Filled 2019-04-27 (×2): qty 1

## 2019-04-27 MED ORDER — OXYBUTYNIN CHLORIDE 5 MG PO TABS
5.0000 mg | ORAL_TABLET | Freq: Every day | ORAL | Status: DC
  Administered 2019-04-27 – 2019-04-28 (×2): 5 mg via ORAL
  Filled 2019-04-27 (×2): qty 1

## 2019-04-27 NOTE — Progress Notes (Signed)
[place] PHYSICAL MEDICINE & REHABILITATION PROGRESS NOTE   Subjective/Complaints:     ROS-  Pt denies SOB, abd pain, CP,N/V/D   Objective:   No results found. No results for input(s): WBC, HGB, HCT, PLT in the last 72 hours. Recent Labs    04/26/19 0536  CREATININE 0.82    Intake/Output Summary (Last 24 hours) at 04/27/2019 0833 Last data filed at 04/27/2019 0831 Gross per 24 hour  Intake 840 ml  Output --  Net 840 ml     Physical Exam: Vital Signs Blood pressure (!) 142/95, pulse 61, temperature 98.2 F (36.8 C), temperature source Oral, resp. rate 18, height 5\' 4"  (1.626 m), weight 44.1 kg, SpO2 97 %.    General: No acute distress, cachectic Mood and affect are appropriate Heart: Regular rate and rhythm no rubs murmurs or extra sounds Lungs: Clear to auscultation, breathing unlabored, no rales or wheezes Abdomen: Positive bowel sounds, soft nontender to palpation, nondistended Extremities: No clubbing, cyanosis, or edema Skin: No evidence of breakdown, no evidence of rash Neurologic: Cranial nerves II through XII intact, motor strength is 4/5 in bilateral deltoid, bicep, tricep, grip, hip flexor, knee extensors, ankle dorsiflexor and plantar flexor Sensory exam normal sensation to light touch and proprioception in bilateral upper and lower extremities Cerebellar exam minimal dysmetria left  finger to nose to finger as well as heel to shin Musculoskeletal: Full range of motion in all 4 extremities. No joint swelling     Assessment/Plan: 1. 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  Physiatrist is providing close team supervision and 24 hour management of active medical problems listed below.  Physiatrist and rehab team continue to assess barriers to discharge/monitor patient progress toward functional and medical goals  Care  Tool:  Bathing    Body parts bathed by patient: Right arm, Left arm, Chest, Abdomen, Front perineal area, Buttocks, Right upper leg, Left upper leg, Right lower leg, Left lower leg, Face         Bathing assist Assist Level: Supervision/Verbal cueing     Upper Body Dressing/Undressing Upper body dressing   What is the patient wearing?: Pull over shirt, Bra    Upper body assist Assist Level: Independent    Lower Body Dressing/Undressing Lower body dressing      What is the patient wearing?: Pants, Underwear/pull up     Lower body assist Assist for lower body dressing: Supervision/Verbal cueing     Toileting Toileting    Toileting assist Assist for toileting: Contact Guard/Touching assist(no device) Assistive Device Comment: RW   Transfers Chair/bed transfer  Transfers assist     Chair/bed transfer assist level: Contact Guard/Touching assist     Locomotion Ambulation   Ambulation assist      Assist level: Contact Guard/Touching assist Assistive device: No Device Max distance: 300   Walk 10 feet activity   Assist     Assist level: Contact Guard/Touching assist Assistive device: No Device   Walk 50 feet activity   Assist    Assist level: Contact Guard/Touching assist Assistive device: No Device    Walk 150 feet activity   Assist Walk 150 feet activity did not occur: Safety/medical concerns(fatigue)  Assist level: Contact Guard/Touching assist Assistive device: No Device    Walk 10 feet on uneven surface  activity   Assist Walk 10 feet on uneven surfaces activity did not occur: Safety/medical concerns(fatigue)   Assist  level: Minimal Assistance - Patient > 75% Assistive device: Hand held assist   Wheelchair     Assist Will patient use wheelchair at discharge?: No   Wheelchair activity did not occur: N/A         Wheelchair 50 feet with 2 turns activity    Assist    Wheelchair 50 feet with 2 turns activity did not  occur: N/A       Wheelchair 150 feet activity     Assist  Wheelchair 150 feet activity did not occur: N/A       Blood pressure (!) 142/95, pulse 61, temperature 98.2 F (36.8 C), temperature source Oral, resp. rate 18, height 5\' 4"  (1.626 m), weight 44.1 kg, SpO2 97 %.  Medical Problem List and Plan: 1.Lower extremity weaknesssecondary to bilateral thalamic lacunar infarct secondary to small vessel disease CIR PT, OT, ELOS 3/18 2. Antithrombotics: -DVT/anticoagulation:Lovenox- PLT normal  -antiplatelet therapy: Aspirin 81 mg daily, Plavix 75 mg daily x3 weeks then Plavix alone 3. Pain Management:Tylenol as needed, Urispas and ditropan for interstitial cystitis  4. Mood:BuSpar 5 mg twice daily, Xanax as needed took 0.5mg  1/2 tab qnoon and q 5pm at home -antipsychotic agents: N/A 5. Neuropsych: This patientiscapable of making decisions on herown behalf. 6. Skin/Wound Care:Routine skin checks 7. Fluids/Electrolytes/Nutrition:Routine in and outs with follow-up , BMET normal 8. Hypertension. Norvasc 2.5 mg daily,Avapro 150 mg daily. Monitor with increased mobility  Vitals:   04/27/19 0411 04/27/19 0741  BP: 119/83 (!) 142/95  Pulse: 61   Resp: 18   Temp: 98.2 F (36.8 C)   SpO2: 97%   labile , 3/16- no med changes 9. Hyperlipidemia. Lipitor 10. Chronic interstitial cystitis. Increase ditropan to 5mg  , cont prn urispas  11. Dry eyes- ordered artifical tears prn for dry eyes- wanted prn.  3/14- working well per pt- con't  12.  Restless legs start requip   LOS: 8 days A FACE TO FACE EVALUATION WAS PERFORMED  Charlett Blake 04/27/2019, 8:33 AM

## 2019-04-27 NOTE — Discharge Summary (Signed)
Physician Discharge Summary  Patient ID: Brooke Collier MRN: [id number] DOB/AGE: 82-Sep-1937 82 y.o.  Admit date: 04/19/2019 Discharge date: 04/29/2019  Discharge Diagnoses:  Principal Problem:   Right-sided lacunar stroke [facility]) Active Problems:   Thalamic infarct, acute ([place]) DVT prophylaxis Mood stabilization Hypertension Hyperlipidemia Chronic interstitial cystitis Restless leg syndrome  Discharged Condition: Stable  Significant Diagnostic Studies: CT ANGIO HEAD W OR WO CONTRAST  Result Date: 04/15/2019 CLINICAL DATA:  84 year old female code stroke presentation yesterday found to have small right greater than left acute on chronic thalamic lacunar infarcts on MRI. EXAM: CT ANGIOGRAPHY HEAD AND NECK TECHNIQUE: Multidetector CT imaging of the head and neck was performed using the standard protocol during bolus administration of intravenous contrast. Multiplanar CT image reconstructions and MIPs were obtained to evaluate the vascular anatomy. Carotid stenosis measurements (when applicable) are obtained utilizing NASCET criteria, using the distal internal carotid diameter as the denominator. CONTRAST:  115mL OMNIPAQUE IOHEXOL 350 MG/ML SOLN COMPARISON:  Plain head CT and brain MRI yesterday. FINDINGS: CT HEAD Brain: No acute intracranial hemorrhage identified. No midline shift, mass effect, or evidence of intracranial mass lesion. No ventriculomegaly. Confluent bilateral cerebral white matter hypodensity as well as heterogeneity in both thalami. Increased hypodensity at the right lateral thalamus since yesterday. No superimposed acute cortically based infarct identified. Calvarium and skull base: Osteopenia. No acute osseous abnormality identified. Paranasal sinuses: Visualized paranasal sinuses and mastoids are stable and well pneumatized. Orbits: No acute orbit or scalp soft tissue finding. CTA NECK Skeleton: Cervical spine degeneration. Osteopenia. No acute osseous abnormality identified.  Upper chest: Negative. Other neck: Negative. Aortic arch: The entire arch is not included. No aortic atherosclerosis is evident. Right carotid system: Distal brachiocephalic artery is normal. Normal right CCA origin. Minimal calcified plaque at the right carotid bifurcation mostly affects the ECA. Tortuous right ICA without stenosis. Left carotid system: Visible proximal left CCA is normal. Calcified plaque at the left ICA origin does not result in stenosis. Negative left ICA otherwise to the skull base. Vertebral arteries: Calcified plaque in the proximal right subclavian artery without significant stenosis. Normal right vertebral artery origin. Dominant and mildly ectatic right vertebral artery is widely patent to the skull base. Visible proximal left subclavian artery and left vertebral artery origin are normal. Non dominant left vertebral artery is patent to the skull base without plaque or stenosis. CTA HEAD Posterior circulation: There is mild calcified plaque in the dominant right V4 segment without stenosis. The left vertebral functionally terminates in PICA. The right AICA appears dominant. Patent basilar artery without stenosis. Normal SCA and right PCA origins. Fetal type left PCA origin. The right posterior communicating artery is diminutive or absent. There is mild irregularity of the bilateral P1 and P2 segments. Otherwise PCA branches are within normal limits. Anterior circulation: Both ICA siphons are patent. On the left there is supraclinoid calcified plaque resulting in about 50% stenosis (series 12, image 94). Normal left posterior communicating artery origin. On the right there is cavernous and supraclinoid calcified plaque with only mild stenosis. Patent carotid termini. Normal MCA and ACA origins. Tortuous A1 segments. Anterior communicating artery and bilateral ACA branches are within normal limits. Left MCA M1 segment and bifurcation are patent without stenosis. Left MCA branches are within  normal limits. Right MCA M1 segment and bifurcation are patent without stenosis. Right MCA branches are normal aside from mild posterior M2 branch irregularity on series 16, image 13. Venous sinuses: Early contrast timing, not well evaluated. Anatomic variants: Dominant right  vertebral artery, the left functionally terminates in PICA. Fetal type left PCA origin. Review of the MIP images confirms the above findings IMPRESSION: 1. Negative for large vessel occlusion. 2. Generalized arterial ectasia with mild for age atherosclerosis in the head and neck. Calcified plaque results in 50% stenosis of the supraclinoid Left ICA siphon. 3. Expected CT appearance of the right thalamic lacunar infarct. No mass effect, hemorrhage, or new intracranial abnormality. Electronically Signed   By: Genevie Ann M.D.   On: 04/15/2019 12:52   CT ANGIO NECK W OR WO CONTRAST  Result Date: 04/15/2019 CLINICAL DATA:  84 year old female code stroke presentation yesterday found to have small right greater than left acute on chronic thalamic lacunar infarcts on MRI. EXAM: CT ANGIOGRAPHY HEAD AND NECK TECHNIQUE: Multidetector CT imaging of the head and neck was performed using the standard protocol during bolus administration of intravenous contrast. Multiplanar CT image reconstructions and MIPs were obtained to evaluate the vascular anatomy. Carotid stenosis measurements (when applicable) are obtained utilizing NASCET criteria, using the distal internal carotid diameter as the denominator. CONTRAST:  188mL OMNIPAQUE IOHEXOL 350 MG/ML SOLN COMPARISON:  Plain head CT and brain MRI yesterday. FINDINGS: CT HEAD Brain: No acute intracranial hemorrhage identified. No midline shift, mass effect, or evidence of intracranial mass lesion. No ventriculomegaly. Confluent bilateral cerebral white matter hypodensity as well as heterogeneity in both thalami. Increased hypodensity at the right lateral thalamus since yesterday. No superimposed acute cortically  based infarct identified. Calvarium and skull base: Osteopenia. No acute osseous abnormality identified. Paranasal sinuses: Visualized paranasal sinuses and mastoids are stable and well pneumatized. Orbits: No acute orbit or scalp soft tissue finding. CTA NECK Skeleton: Cervical spine degeneration. Osteopenia. No acute osseous abnormality identified. Upper chest: Negative. Other neck: Negative. Aortic arch: The entire arch is not included. No aortic atherosclerosis is evident. Right carotid system: Distal brachiocephalic artery is normal. Normal right CCA origin. Minimal calcified plaque at the right carotid bifurcation mostly affects the ECA. Tortuous right ICA without stenosis. Left carotid system: Visible proximal left CCA is normal. Calcified plaque at the left ICA origin does not result in stenosis. Negative left ICA otherwise to the skull base. Vertebral arteries: Calcified plaque in the proximal right subclavian artery without significant stenosis. Normal right vertebral artery origin. Dominant and mildly ectatic right vertebral artery is widely patent to the skull base. Visible proximal left subclavian artery and left vertebral artery origin are normal. Non dominant left vertebral artery is patent to the skull base without plaque or stenosis. CTA HEAD Posterior circulation: There is mild calcified plaque in the dominant right V4 segment without stenosis. The left vertebral functionally terminates in PICA. The right AICA appears dominant. Patent basilar artery without stenosis. Normal SCA and right PCA origins. Fetal type left PCA origin. The right posterior communicating artery is diminutive or absent. There is mild irregularity of the bilateral P1 and P2 segments. Otherwise PCA branches are within normal limits. Anterior circulation: Both ICA siphons are patent. On the left there is supraclinoid calcified plaque resulting in about 50% stenosis (series 12, image 94). Normal left posterior communicating  artery origin. On the right there is cavernous and supraclinoid calcified plaque with only mild stenosis. Patent carotid termini. Normal MCA and ACA origins. Tortuous A1 segments. Anterior communicating artery and bilateral ACA branches are within normal limits. Left MCA M1 segment and bifurcation are patent without stenosis. Left MCA branches are within normal limits. Right MCA M1 segment and bifurcation are patent without stenosis. Right MCA  branches are normal aside from mild posterior M2 branch irregularity on series 16, image 13. Venous sinuses: Early contrast timing, not well evaluated. Anatomic variants: Dominant right vertebral artery, the left functionally terminates in PICA. Fetal type left PCA origin. Review of the MIP images confirms the above findings IMPRESSION: 1. Negative for large vessel occlusion. 2. Generalized arterial ectasia with mild for age atherosclerosis in the head and neck. Calcified plaque results in 50% stenosis of the supraclinoid Left ICA siphon. 3. Expected CT appearance of the right thalamic lacunar infarct. No mass effect, hemorrhage, or new intracranial abnormality. Electronically Signed   By: Genevie Ann M.D.   On: 04/15/2019 12:52   MR BRAIN WO CONTRAST  Result Date: 04/14/2019 CLINICAL DATA:  84 year old female code stroke, left-sided weakness. EXAM: MRI HEAD WITHOUT CONTRAST TECHNIQUE: Multiplanar, multiecho pulse sequences of the brain and surrounding structures were obtained without intravenous contrast. COMPARISON:  Plain head CT earlier today. FINDINGS: Brain: There is a small 8 mm focus of restricted diffusion in the dorsal right deep gray nuclei, lateral thalamus near the posterior limb of the right internal capsule (series 5, image 69). No significant T2 or FLAIR hyperintensity at this time. Questionable punctate contralateral anterior left thalamic focus of restricted diffusion (series 7, image 54). No evidence of associated acute hemorrhage.  No mass effect. Underlying  bilateral thalamic and basal ganglia T2 and FLAIR heterogeneity. Scattered chronic microhemorrhages, including in both occipital lobes, left perirolandic cortex on series 14, image 37. Confluent bilateral cerebral white matter T2 and FLAIR hyperintensity. No cortical encephalomalacia identified. No other restricted diffusion. No midline shift, mass effect, evidence of mass lesion, ventriculomegaly, extra-axial collection or acute intracranial hemorrhage. Cervicomedullary junction and pituitary are within normal limits. Mild T2 heterogeneity in the pons. Vascular: Major intracranial vascular flow voids are preserved. The right vertebral artery is dominant. Skull and upper cervical spine: Negative for age visible cervical spine. Normal bone marrow signal. Sinuses/Orbits: Postoperative changes to the left globe. Paranasal sinuses and mastoids are stable and well pneumatized. Other: Grossly normal internal auditory structures. Scalp and face soft tissues appear negative. IMPRESSION: 1. Acute on chronic small vessel ischemia in the thalami: An 8 mm right lateral thalamic/internal capsule lacune is noted along with evidence of a punctate contralateral left ventral thalamic infarct. 2. No associated acute hemorrhage or mass effect. Occasional chronic micro hemorrhages in conjunction with underlying chronic small vessel disease. Electronically Signed   By: Genevie Ann M.D.   On: 04/14/2019 12:58   DG CHEST PORT 1 VIEW  Result Date: 04/14/2019 CLINICAL DATA:  Headache and left-sided numbness. EXAM: PORTABLE CHEST 1 VIEW COMPARISON:  Chest x-ray 05/04/2016 FINDINGS: The heart is within normal limits in size given the AP projection and portable technique. There is mild tortuosity and calcification of the thoracic aorta. Stable hyperinflation and emphysematous changes but no acute overlying pulmonary process or worrisome pulmonary lesions. No pleural effusion. The bony thorax is intact. IMPRESSION: Stable emphysematous changes  and hyperinflation but no acute overlying pulmonary process. Electronically Signed   By: Marijo Sanes M.D.   On: 04/14/2019 14:11   ECHOCARDIOGRAM COMPLETE  Result Date: 04/15/2019    ECHOCARDIOGRAM REPORT   Patient Name:   Brooke Collier Date of Exam: 04/15/2019 Medical Rec #:  [id number]           Height:       64.0 in Accession #:    [id number]          Weight:  93.9 lb Date of Birth:  Jul 08, 1935            BSA:          1.419 m Patient Age:    85 years            BP:           146/71 mmHg Patient Gender: F                   HR:           74 bpm. Exam Location:  Inpatient Procedure: 2D Echo, Cardiac Doppler and Color Doppler Indications:    Stroke  History:        Patient has no prior history of Echocardiogram examinations.                 Abnormal ECG, Stroke; Risk Factors:Dyslipidemia and                 Hypertension.  Sonographer:    Roseanna Rainbow RDCS Referring Phys: [place]  1. Left ventricular ejection fraction, by estimation, is 60 to 65%. The left ventricle has normal function. The left ventricle has no regional wall motion abnormalities. Left ventricular diastolic parameters are consistent with Grade I diastolic dysfunction (impaired relaxation).  2. Right ventricular systolic function is normal. The right ventricular size is normal. There is normal pulmonary artery systolic pressure. The estimated right ventricular systolic pressure is [id number] mmHg.  3. The mitral valve is normal in structure and function. No evidence of mitral valve regurgitation. No evidence of mitral stenosis.  4. The aortic valve is normal in structure and function. Aortic valve regurgitation is mild. Mild aortic valve sclerosis is present, with no evidence of aortic valve stenosis.  5. Severe atherosclerotic plaque is seen in the abdominal aorta. The aortic arch could not be well visualized.  6. The inferior vena cava is normal in size with greater than 50% respiratory variability, suggesting right atrial  pressure of 3 mmHg. FINDINGS  Left Ventricle: Left ventricular ejection fraction, by estimation, is 60 to 65%. The left ventricle has normal function. The left ventricle has no regional wall motion abnormalities. The left ventricular internal cavity size was normal in size. There is  no left ventricular hypertrophy. Left ventricular diastolic parameters are consistent with Grade I diastolic dysfunction (impaired relaxation). Normal left ventricular filling pressure. Right Ventricle: The right ventricular size is normal. No increase in right ventricular wall thickness. Right ventricular systolic function is normal. There is normal pulmonary artery systolic pressure. The tricuspid regurgitant velocity is 2.40 m/s, and  with an assumed right atrial pressure of 3 mmHg, the estimated right ventricular systolic pressure is [id number] mmHg. Left Atrium: Left atrial size was normal in size. Right Atrium: Right atrial size was normal in size. Pericardium: There is no evidence of pericardial effusion. Mitral Valve: The mitral valve is normal in structure and function. Normal mobility of the mitral valve leaflets. No evidence of mitral valve regurgitation. No evidence of mitral valve stenosis. Tricuspid Valve: The tricuspid valve is normal in structure. Tricuspid valve regurgitation is mild . No evidence of tricuspid stenosis. Aortic Valve: The aortic valve is normal in structure and function. Aortic valve regurgitation is mild. Aortic regurgitation PHT measures 530 msec. Mild aortic valve sclerosis is present, with no evidence of aortic valve stenosis. Pulmonic Valve: The pulmonic valve was normal in structure. Pulmonic valve regurgitation is not visualized. No evidence of pulmonic stenosis. Aorta: Severe atherosclerotic  plaque is seen in the abdominal aorta. The aortic arch could not be well visualized. The aortic root is normal in size and structure. Venous: The inferior vena cava is normal in size with greater than 50%  respiratory variability, suggesting right atrial pressure of 3 mmHg. IAS/Shunts: No atrial level shunt detected by color flow Doppler.  LEFT VENTRICLE PLAX 2D LVIDd:         3.70 cm     Diastology LVIDs:         2.50 cm     LV e' lateral:   9.79 cm/s LV PW:         1.10 cm     LV E/e' lateral: 7.1 LV IVS:        1.40 cm     LV e' medial:    5.87 cm/s LVOT diam:     1.90 cm     LV E/e' medial:  11.9 LV SV:         56 LV SV Index:   40 LVOT Area:     2.84 cm  LV Volumes (MOD) LV vol d, MOD A2C: 38.9 ml LV vol d, MOD A4C: 57.0 ml LV vol s, MOD A2C: 17.2 ml LV vol s, MOD A4C: 26.7 ml LV SV MOD A2C:     21.7 ml LV SV MOD A4C:     57.0 ml LV SV MOD BP:      27.4 ml RIGHT VENTRICLE             IVC RV S prime:     15.90 cm/s  IVC diam: 1.40 cm TAPSE (M-mode): 1.4 cm LEFT ATRIUM             Index       RIGHT ATRIUM           Index LA diam:        2.40 cm 1.69 cm/m  RA Area:     11.20 cm LA Vol (A2C):   40.0 ml 28.19 ml/m RA Volume:   26.90 ml  18.96 ml/m LA Vol (A4C):   36.8 ml 25.94 ml/m LA Biplane Vol: 42.2 ml 29.74 ml/m  AORTIC VALVE LVOT Vmax:   106.00 cm/s LVOT Vmean:  71.800 cm/s LVOT VTI:    0.199 m AI PHT:      530 msec  AORTA Ao Root diam: 2.90 cm Ao Asc diam:  3.10 cm MITRAL VALVE               TRICUSPID VALVE MV Area (PHT): 2.39 cm    TR Peak grad:   23.0 mmHg MV Decel Time: 317 msec    TR Vmax:        240.00 cm/s MV E velocity: 69.80 cm/s MV A velocity: 89.20 cm/s  SHUNTS MV E/A ratio:  0.78        Systemic VTI:  0.20 m                            Systemic Diam: 1.90 cm Dani Gobble Croitoru MD Electronically signed by Sanda Klein MD Signature Date/Time: 04/15/2019/1:24:50 PM    Final    CT HEAD CODE STROKE WO CONTRAST  Result Date: 04/14/2019 CLINICAL DATA:  Code stroke. 84 year old female with left side weakness, last seen normal 0900 hours. EXAM: CT HEAD WITHOUT CONTRAST TECHNIQUE: Contiguous axial images were obtained from the base of the skull through the vertex without intravenous contrast. COMPARISON:   Head CT  05/04/2016. FINDINGS: Brain: No midline shift, mass effect, or evidence of intracranial mass lesion. No ventriculomegaly. No acute intracranial hemorrhage identified. Confluent bilateral cerebral white matter hypodensity has not significantly changed since 2018. Posterior deep white matter capsule involvement as before. Lesser deep gray nuclei heterogeneity also appears stable. No superimposed acute cortically based infarct identified. Vascular: Calcified atherosclerosis at the skull base. No suspicious intracranial vascular hyperdensity. Skull: No acute osseous abnormality identified. Sinuses/Orbits: Visualized paranasal sinuses and mastoids are stable and well pneumatized. Other: Postoperative changes to the left globe since 2018. No acute orbit or scalp soft tissue findings. ASPECTS [facility] Stroke Program Early CT Score) Total score (0-10 with 10 being normal): 10 IMPRESSION: 1. No acute cortically based infarct or acute intracranial hemorrhage identified. ASPECTS 10. 2. Chronic small vessel disease appears stable by CT since 2018. 3. These results were communicated to Dr. Royal Hawthorn at 11:11 am on 04/14/2019 by text page via the [facility] messaging system. Electronically Signed   By: Genevie Ann M.D.   On: 04/14/2019 11:11    Labs:  Basic Metabolic Panel: Recent Labs  Lab 04/26/19 0536  CREATININE 0.82    CBC: No results for input(s): WBC, NEUTROABS, HGB, HCT, MCV, PLT in the last 168 hours.  CBG: No results for input(s): GLUCAP in the last 168 hours.  Family history.  Mother with CAD.  Father with CAD.  Sister with anxiety disorder.  Denies any hypertension hyperlipidemia or diabetes mellitus or rectal cancer  Brief HPI:   Brooke Collier is a 84 y.o. right-handed female with history of hypertension, anxiety, chronic interstitial cystitis.  Patient lives alone independent prior to admission to level home.  Presented 04/14/2019 lower extremity weakness.  Blood pressure 170/79.  Admission  chemistries unremarkable, SARS coronavirus negative.  Cranial CT scan unremarkable for acute intracranial process.  Patient did not receive TPA.  MRI showed acute on chronic small vessel ischemia in the thalami.  An 8 mm right lateral thalamic internal capsule lacunar noted along with evidence of punctate contralateral left ventral thalamic infarction.  CT angiogram of head and neck showed generalized Ectasia with mild atherosclerotic changes with 50% left supraclinoid ICA stenosis only.  Echocardiogram with ejection fraction of 65% without emboli.  Aspirin and Plavix added for CVA prophylaxis x3 weeks then Plavix alone.  Subtenons Lovenox for DVT prophylaxis.  Tolerating a regular diet.  Patient was admitted for a comprehensive rehab program   Hospital Course: Brooke Collier was admitted to rehab 04/19/2019 for inpatient therapies to consist of PT, ST and OT at least three hours five days a week. Past admission physiatrist, therapy team and rehab RN have worked together to provide customized collaborative inpatient rehab.  Pertaining to patient's bilateral thalamic lacunar infarction secondary small vessel disease remained stable she would continue aspirin and Plavix x3 weeks then Plavix alone as well as follow-up neurology services.  Mood stabilization with BuSpar twice daily Xanax as needed with emotional support provided.  Blood pressures controlled with low-dose Norvasc as well as Avapro no orthostatic changes she would follow-up with her primary MD.  Patient long history of chronic interstitial cystitis her Ditropan was increased to 5 mg as well as continued on Urispas as needed and she denied any dysuria or hematuria.  Restless leg syndrome with Requip as directed.  Lipitor for hyperlipidemia.   Blood pressures were monitored on TID basis and controlled   Brooke Collier is continent of bowel and bladder.  Brooke Collier has made gains during rehab stay and is  attending therapies  /She will continue to receive  follow up therapies   after discharge  Rehab course: During patient's stay in rehab weekly team conferences were held to monitor patient's progress, set goals and discuss barriers to discharge. At admission, patient required minimal assist ambulate 15 feet rolling walker, minimal assist sit to supine and supine to sit.  Minimal assist grooming, minimal assist upper body bathing, moderate assist lower body bathing, minimal assist upper body dressing, moderate assist lower body dressing, mod assist toilet transfers.  Physical exam.  Blood pressure 145/79 pulse 57 temperature 97.7 respirations 16 oxygen saturation 96% room air Constitutional.  Well-developed well-nourished HEENT Head.  Normocephalic and atraumatic Eyes.  Pupils round and reactive to light no discharge.nystagmus Neck.  Supple nontender no JVD without thyromegaly Cardiac regular rate rhythm without any extra sounds or murmur heard Respiratory effort normal no respiratory distress without wheeze Abdomen.  Soft nontender positive bowel sounds without rebound Extremities.  No clubbing cyanosis or edema Skin.  No evidence of breakdown or rash Neurological.  Cranial nerves II through XII intact motor strength 4 out of 5 bilateral deltoid bicep tricep grip hip flexors knee extensors ankle dorsi and plantar flexion.  Sensory exam normal sensation to light touch in bilateral upper and lower extremities.  Cerebellar exam normal finger-to-nose testing.  Mild dysmetria left upper extremity  /She  has had improvement in activity tolerance, balance, postural control as well as ability to compensate for deficits. Brooke Collier has had improvement in functional use RUE/LUE  and RLE/LLE as well as improvement in awareness.  Patient performs bed mobility transfers to the toilet and standing at sink for self-care with supervision.  Needed very subtle verbal cues for upright posture.  Ambulates rolling walker 150 feet to the therapy gym supervision provided for  verbal cues for upright gaze and posture.  Patient provides contact-guard assist and verbal cues for sequencing of chair mat and chair.  Ambulates an additional 150 feet back to the room.  Working with energy conservation techniques.  Supine to edge of bed with modified independent for increased time.  Patient completed functional mobility to the bathroom for toileting requiring supervision assist for transfers to standard commode and toileting hygiene dressing clothing management modified independent with the use of a walker.  Full family teaching completed plan discharge to home       Disposition: Discharge to home    Diet: Regular  Special Instructions: No driving smoking or alcohol  Continue aspirin and Plavix x3 weeks total then Plavix alone  Medications at discharge. 1.  Tylenol as needed 2.  Xanax 0.125 mg nightly as needed 3.  Norvasc 2.5 mg p.o. daily 4.  Aspirin 81 mg p.o. daily to be discontinued 05/06/2019 5.  Lipitor 40 mg p.o. daily 6.  BuSpar 5 mg p.o. twice daily 7.  Plavix 75 mg p.o. daily 8.  Voltaren gel 2 g 4 times daily 9.urispas 100 mg twice daily as needed 10.  Avapro 150 mg p.o. daily 11.  Ditropan 5 mg p.o. nightly 12.  Requip 0.25 mg p.o. nightly  Discharge Instructions    Ambulatory referral to Neurology   Complete by: As directed    An appointment is requested in approximately 4 weeks bilateral thalamic infarction   Ambulatory referral to Physical Medicine Rehab   Complete by: As directed    Moderate complexity follow-up 1 to 2 weeks bilateral thalamic infarction      Follow-up Information    Kirsteins, Luanna Salk, MD Follow up.  Specialty: Physical Medicine and Rehabilitation Why: Office to call for appointment Contact information: [place] [address] [phone]           Signed: Cathlyn Parsons 04/29/2019, 4:59 AM

## 2019-04-27 NOTE — Progress Notes (Signed)
Physical Therapy Session Note  Patient Details  Name: Brooke Collier MRN: [id number] Date of Birth: Sep 10, 1935  Today's Date: 04/27/2019 PT Individual Time: 0902-1019 PT Individual Time Calculation (min): 77 min   Short Term Goals: Week 1:  PT Short Term Goal 1 (Week 1): Will be able to transfer with LRAD and S PT Short Term Goal 2 (Week 1): Will score at least 40 on Berg balance test PT Short Term Goal 3 (Week 1): Will tolerate gait training at least 181ft with S and LRAD  Skilled Therapeutic Interventions/Progress Updates:     Therapy session focused on pt's mobility goals in preparation for discharge. Slight pain in R flank and hip reported. Chronic in nature. No numerical rating provided. Pt repositioned for comfort.  Pt received in supine. Performs bed mobility, transfer to toilet, and standing at sink for self-care with supervision. Verbal cues provided for upright posture and performing in-room ambulation without using BUEs to hold onto furniture. Pt requires assistance to clasp bra but otherwise changes clothes independently.  Ambulates with RW x150' to therapy gym. Supervision provided for verbal cues for upright gaze and posture. Pt has x1 slight LOB requiring CGA for safety. Seated rest break required prior to performing stair training. x16 steps total with right hand rail and supervision. Verbal cues for correct sequencing and use of step-to pattern. Final x4 steps pt performs with reciprocal gait pattern.   Seated rest break prior to performing floor transfer. PT provides CGA and verbal cues for sequencing of chair>mat>chair. Pt uses BUEs and quadruped positioning to push up on chair to high kneeling and then kneeling to standing. Pt educated on stroke warning signs and safety plan if she were to fall in home.  Pt ambulates x150' back to room with supervision and verbal cues to increase gait speed. Performs additional toileting and clothing change prior to return to bed. Pt  left supine in bed with alarm activated.  Pt appears fatigued throughout session, and even moreso following therapy. Verbalizes increased LLE "heaviness" at need of session.  Therapy Documentation Precautions:  Precautions Precautions: Fall Restrictions Weight Bearing Restrictions: No    Therapy/Group: Individual Therapy  Breck Coons,  PT, DPT 04/27/2019, 10:33 AM

## 2019-04-27 NOTE — Progress Notes (Addendum)
Physical Therapy Session Note  Patient Details  Name: Brooke Collier MRN: [id number] Date of Birth: [birth year]  Today's Date: 04/27/2019 PT Individual Time: [id number] PT Individual Time Calculation (min): 73 min   Short Term Goals: Week 1:  PT Short Term Goal 1 (Week 1): Will be able to transfer with LRAD and S PT Short Term Goal 2 (Week 1): Will score at least 40 on Berg balance test PT Short Term Goal 3 (Week 1): Will tolerate gait training at least 115ft with S and LRAD  Skilled Therapeutic Interventions/Progress Updates:    Patient seated in w/c upon PT arrival, agreeable to therapy tx, denies pain. Pt's daughter, April, present for family education. Pt transported to rehab gym in w/c for energy conservation. Sit > standing at stairs at mod-I level. Therapist demonstrated guarding pt while performing stair negotiation x 4 steps up/down with CGA giving verbal cues regarding guarding to pt's daughter. Pt ascended/descended 4 steps again with daughter providing appropriate guarding for safety. Therapist discussed planning prior to stair negotiation with having a chair set up at the top or bottom of the steps prior to ascending/descending to allow pt to rest if needed. Pt transported to ortho gym for a car transfer. Pt ambulated 5' with RW to the car and performed transfer with daughter providing supervision-CGA. Therapist instructing pt's daughter in safe guarding positioning and sequencing with sit <> stand transitions. Pt transported back to rehab gym to establish a home exercise program. Sit > stand from w/c to RW > stand pivot to mat with supervision. Discussed performing sit <> stands without UE support similarly to previous sessions, pt reported feeling fatigued, therapist differed exercise for future sessions. In sitting, pt performed clamshells with level 2 theraband x 10 focusing on moving LLE and keeping RLE stationary, LAQ in available pain-free ROM with level 2 theraband x 10 each LE.  Pt reported needing to use the restroom. Stand pivot transfer with RW to w/c with supervision. Pt transported back to room for time management. Sit > stand with mod-I level to RW, pt ambulated x 10' into the bathroom, in standing performed clothing management with supervision-CGA. Stand > sit on commode with supervision, therapist provided distant supervision for pt privacy. Pt performed pericare without assist and was continent of bladder, sit > standing at RW mod-I, pt performed clothing management with supervision. Pt ambulated 10' with RW and supervision to sink to perform hand hygiene and then ambulated 5' to bed. Stand > sitting EOB > supine in bed with supervision. In supine, pt performed bridging x 8 reps reported HA and some neck discomfort, therapist communicated with RN about pain. In sidelying, pt performed hip abduction RLE x 8 reps, LLE x 4 reps with reports of fatigue and pain. RN in for medication administration. Pt left supine in bed with needs in reach and bed alarm set. Left in care of RN.    Therapy Documentation Precautions:  Precautions Precautions: Fall Restrictions Weight Bearing Restrictions: No    Therapy/Group: Individual Therapy  Juliann Pulse SPT 04/27/2019, 7:58 AM

## 2019-04-27 NOTE — Progress Notes (Signed)
Occupational Therapy Session Note  Patient Details  Name: Brooke Collier MRN: [id number] Date of Birth: 02-Nov-1935  Today's Date: 04/27/2019 OT Individual Time: 1300-1400 OT Individual Time Calculation (min): 60 min    Short Term Goals: Week 1:  OT Short Term Goal 1 (Week 1): STG=LTG  Skilled Therapeutic Interventions/Progress Updates:  Patient met lying supine in bed in agreement with family education session with daughter present at bedside. Supine to EOB with Mod I for increased time. Patient completed functional mobility to bathroom for toileting, requiring supervision A for transfer to standard commode and toileting/hygiene/clothing management with Mod I and use of RW. Patient verbalized increased anxiety this date with ability to recall anxiety management techniques discussed in previous OT session. Patient ambulated to ADL apartment for family education on safety with shower transfers, kitchen safety, and adaptive strategies to increase independence and decrease need for assistance. Patient completed shower transfer with shower chair and Mod I. Education on use of long-handed shower head, shower chair, and non-slip mat in shower to increase independence and decrease need for assistance. Patient/family reports presence of suction grab bars in shower with OT providing education on safety including using suction grab bars to steady only and removal of throw rugs on outside of shower and near commode. Patient/family expressed verbal understanding. Handout explained and provided for L hand coordiantion with follow up instrusction to be provided tomorrow. Patient/family educated on kitchen safety including bringing commonly used items to counter surface to prevent reaching/bending. Patient ambulated to room with supervision A. Patient left seated in wc with call bell within reach and daughter present.     Therapy Documentation Precautions:  Precautions Precautions: Fall Restrictions Weight  Bearing Restrictions: No   Therapy/Group: Individual Therapy  Erlinda Solinger R [place] 04/27/2019, 8:00 AM

## 2019-04-28 ENCOUNTER — Inpatient Hospital Stay (HOSPITAL_COMMUNITY): Payer: Medicare HMO | Admitting: Occupational Therapy

## 2019-04-28 ENCOUNTER — Inpatient Hospital Stay (HOSPITAL_COMMUNITY): Payer: Medicare HMO

## 2019-04-28 MED ORDER — FLAVOXATE HCL 100 MG PO TABS: 100.0000 mg | ORAL_TABLET | Freq: Two times a day (BID) | ORAL | 0 refills | Status: DC | PRN

## 2019-04-28 MED ORDER — OXYBUTYNIN CHLORIDE 5 MG PO TABS: 5.0000 mg | ORAL_TABLET | Freq: Every day | ORAL | 0 refills | Status: DC

## 2019-04-28 MED ORDER — CLOPIDOGREL BISULFATE 75 MG PO TABS: 75.0000 mg | ORAL_TABLET | Freq: Every day | ORAL | 1 refills | Status: DC

## 2019-04-28 MED ORDER — IRBESARTAN 150 MG PO TABS: 150.0000 mg | ORAL_TABLET | Freq: Every day | ORAL | 0 refills | Status: DC

## 2019-04-28 MED ORDER — DICLOFENAC SODIUM 1 % EX GEL: 2.0000 g | Freq: Four times a day (QID) | CUTANEOUS | 1 refills | Status: DC

## 2019-04-28 MED ORDER — ROPINIROLE HCL 0.25 MG PO TABS: 0.2500 mg | ORAL_TABLET | Freq: Every day | ORAL | 0 refills | Status: DC

## 2019-04-28 MED ORDER — BUSPIRONE HCL 5 MG PO TABS: 5.0000 mg | ORAL_TABLET | Freq: Two times a day (BID) | ORAL | 0 refills | Status: DC

## 2019-04-28 MED ORDER — ALPRAZOLAM 0.25 MG PO TABS: 0.1250 mg | ORAL_TABLET | Freq: Every evening | ORAL | 0 refills | Status: DC | PRN

## 2019-04-28 MED ORDER — ATORVASTATIN CALCIUM 40 MG PO TABS: 40.0000 mg | ORAL_TABLET | Freq: Every day | ORAL | 0 refills | Status: DC

## 2019-04-28 MED ORDER — AMLODIPINE BESYLATE 2.5 MG PO TABS: 2.5000 mg | ORAL_TABLET | Freq: Every day | ORAL | 0 refills | Status: DC

## 2019-04-28 MED ORDER — ASPIRIN 81 MG PO TBEC: 81.0000 mg | DELAYED_RELEASE_TABLET | Freq: Every day | ORAL | Status: DC

## 2019-04-28 NOTE — Patient Care Conference (Signed)
Inpatient RehabilitationTeam Conference and Plan of Care Update Date: 04/28/2019   Time: 10:50 AM    Patient Name: Brooke Collier      Medical Record Number: [id number]  Date of Birth: 01-08-1936 Sex: Female         Room/Bed: 4W23C/4W23C-01 Payor Info: Payor: [employer] MEDICARE / Plan: [place] HMO / Product Type: *No Product type* /    Admit Date/Time:  04/19/2019  2:58 PM  Primary Diagnosis:  Right-sided lacunar stroke [facility])  Patient Active Problem List   Diagnosis Date Noted  . Right-sided lacunar stroke ([place]) 04/19/2019  . Thalamic infarct, acute ([place]) 04/19/2019  . Anxiety state   . Chronic cystitis   . Cerebral thrombosis with cerebral infarction 04/15/2019  . Weakness of extremity 04/14/2019  . Stroke ([place])   . Hypertensive urgency   . Protein-calorie malnutrition, severe 05/06/2016  . Postoperative anemia due to acute blood loss   . Hip fracture ([place]) 05/04/2016  . Closed comminuted intertrochanteric fracture of proximal end of left femur ([place])   . HYPOTHYROIDISM 11/17/2008  . UNSPECIFIED VITAMIN D DEFICIENCY 11/17/2008  . HLD (hyperlipidemia) 11/17/2008  . ANEMIA 11/17/2008  . 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 Essential hypertension 07/21/2006  . ALLERGIC RHINITIS 07/21/2006  . ARTHRITIS 07/21/2006    Expected Discharge Date: Expected Discharge Date: 04/29/19  Team Members Present: Physician leading conference: Dr. [name] Care Coodinator Present: Nestor Lewandowsky, RN, BSN, CRRN;Genie Tomoko Sandra, RN, MSN Nurse Present: Judee Clara, LPN PT Present: Michaelene Song, PT OT Present: Clyda Greener, OT SLP Present: Jettie Booze, CF-SLP PPS Coordinator present : Gunnar Fusi, SLP     Current Status/Progress Goal Weekly Team Focus  Bowel/Bladder   continent of B/B, pt has high urgency and frequency of urine, lbm 04/27/2019, pt complains of bloating and nausea  remain cont of b/b  assess q shift and prn, assist with use of bathroom when needed     Swallow/Nutrition/ Hydration             ADL's   supervision to modified independent for bathing, dressing, toileting with use of the RW for support.  Slight decreased coordination in the LUE but uses it functionally at a non-dominant level independently  modified independent level  selfcare retraining, balance retraining, DME education, family education, neuromuscular re-education   Mobility   Supervision for bed mobility, transfers and ambulation with RW. Ambulates without AD with CGA up to 300'.  Mod(I) all mobility and ambulation with RW up to 150'  Gait training with improved mechanics, balance, and acitivity tolerance.   Communication             Safety/Cognition/ Behavioral Observations            Pain   pt complains of pain in leg 5/10 and some restless leg issues  decrease pain  assess q shift and prn, administer medication as needed   Skin   no eveidence of skin breakdown   maintain skin integrity and remain free form skin infection  assess ksin q shift and prn    Rehab Goals Patient on target to meet rehab goals: Yes *See Care Plan and progress notes for long and short-term goals.     Barriers to Discharge  Current Status/Progress Possible Resolutions Date Resolved   Nursing                  PT  OT                  SLP                SW Decreased caregiver support Two level home with ramped entrance and access to bath/bed on 1 st level Family education planned for 04/27/19 with daughter and son and discharge home with daughter checking in on the patient          Discharge Planning/Teaching Needs:  Home with family assisting as needed  Transfers, toileting, medications, etc   Team Discussion: MD restless legs, med stable.  RN cont B/B, anxious, on xanax.  OT mod I B/D and toileting, S shower RW, mod I toilet transfers.  PT goal level amb 150' RW, fam ed with Dtr yesterday, anxious today, asking for neuropsych to see again.  Dtr works.    Revisions to Treatment Plan: N/A     Medical Summary Current Status: left side ataxia unchanged, occ anxiety Weekly Focus/Goal: IC med adjustment , trial of requip for restless legs  Barriers to Discharge: Medical stability;Other (comments)  Barriers to Discharge Comments: Interstitial  cystitis Possible Resolutions to Barriers: D/C home in am   Continued Need for Acute Rehabilitation Level of Care: The patient requires daily medical management by a physician with specialized training in physical medicine and rehabilitation for the following reasons: Direction of a multidisciplinary physical rehabilitation program to maximize functional independence : Yes Medical management of patient stability for increased activity during participation in an intensive rehabilitation regime.: Yes Analysis of laboratory values and/or radiology reports with any subsequent need for medication adjustment and/or medical intervention. : Yes   I attest that I was present, lead the team conference, and concur with the assessment and plan of the team.   Retta Diones 04/28/2019, 2:33 PM   Team conference was held via web/ teleconference due to [place] - 19

## 2019-04-28 NOTE — Progress Notes (Signed)
Occupational Therapy Discharge Summary  Patient Details  Name: Brooke Collier MRN: [id number] Date of Birth: [birth year]  Today's Date: 04/28/2019 OT Individual Time: 0802-0902 OT Individual Time Calculation (min): 60 min   Session Note:  Pt completed toileting, showering, dressing, and grooming during session.  She was able to complete toilet transfer and completion of toileting tasks with modified independence using the RW for support.  She was then able to transfer over to the walk-in shower in her room at the same level.  She completed all bathing sit to stand with modified independence and use of the grab bars for support.  She then transferred out to the edge of the bed and worked on dressing.  She completed all dressing with modified independence and then ambulated over to the sink and completed oral hygiene and brushing her hair at the sink with independence.  Finished session with pt in the wheelchair with call button and phone in reach at end of session.  Patient has met 8 of 10 long term goals due to improved activity tolerance, improved balance, postural control, ability to compensate for deficits, functional use of  LEFT upper and LEFT lower extremity, improved attention and improved awareness.  Patient to discharge at overall Modified Independent level.  Patient's care partner is independent to provide the necessary physical assistance at discharge.    Reasons goals not met: Pt needs supervision for simple meal prep and for shower transfers  Recommendation:  Patient will benefit from ongoing skilled OT services in home health setting to continue to advance functional skills in the area of BADL, iADL and Reduce care partner burden.  Pt still exhibits deficits with dynamic standing balance as well as LUE coordination and strength.  Feel pt will benefit from continued [place] for progression to independent level for all selfcare tasks.   Equipment: No equipment provided  Reasons for  discharge: treatment goals met and discharge from hospital  Patient/family agrees with progress made and goals achieved: Yes  OT Discharge Precautions/Restrictions  Precautions Precautions: Fall Restrictions Weight Bearing Restrictions: No  Pain Pain Assessment Pain Scale: 0-10 Pain Score: 0-No pain ADL ADL Eating: Independent Where Assessed-Eating: Wheelchair Grooming: Independent Where Assessed-Grooming: Standing at sink Upper Body Bathing: Modified independent Where Assessed-Upper Body Bathing: Shower Lower Body Bathing: Modified independent Where Assessed-Lower Body Bathing: Shower Upper Body Dressing: Independent Where Assessed-Upper Body Dressing: Edge of bed Lower Body Dressing: Modified independent Where Assessed-Lower Body Dressing: Edge of bed Toileting: Modified independent Where Assessed-Toileting: Bedside Commode Toilet Transfer: Modified independent Toilet Transfer Method: Counselling psychologist: Grab bars, Raised toilet seat Tub/Shower Transfer: Not assessed Tub/Shower Transfer Method: Stand pivot Tub/Shower Equipment: Radio broadcast assistant, Grab bars, Walk in Facilities manager Transfer: Modified independent Social research officer, government Method: Heritage manager: Grab bars, Radio broadcast assistant Vision Baseline Vision/History: Cataracts;Wears glasses Wears Glasses: At all times Patient Visual Report: No change from baseline Vision Assessment?: No apparent visual deficits Eye Alignment: Within Functional Limits Ocular Range of Motion: Within Functional Limits Alignment/Gaze Preference: Within Defined Limits Perception  Perception: Within Functional Limits Praxis Praxis: Intact Cognition Overall Cognitive Status: Within Functional Limits for tasks assessed Arousal/Alertness: Awake/alert Orientation Level: Oriented X4 Attention: Selective Selective Attention: Appears intact Memory: Appears intact Awareness: Appears  intact Problem Solving: Appears intact Safety/Judgment: Appears intact Sensation Sensation Light Touch: Appears Intact Peripheral sensation comments: Pt with on and off numbness in the left hand at times. Hot/Cold: Appears Intact Proprioception: Appears Intact Stereognosis: Appears Intact Additional Comments: sensation  grossly intact B LEs Coordination Gross Motor Movements are Fluid and Coordinated: Yes Fine Motor Movements are Fluid and Coordinated: Yes Coordination and Movement Description: Slight decreased coordination noted still in the left hand but only minor and does not affect functional use. Finger Nose Finger Test: slower rate of speed on the left compared to the right Heel Shin Test: [facility] Motor  Motor Motor - Skilled Clinical Observations: generalized weakness Motor - Discharge Observations: generalized weakness Mobility  Bed Mobility Bed Mobility: Rolling Right;Rolling Left;Supine to Sit;Sit to Supine Rolling Right: Independent Rolling Left: Independent Right Sidelying to Sit: Independent Supine to Sit: Independent Sit to Supine: Independent Transfers Sit to Stand: Independent with assistive device Stand to Sit: Independent with assistive device  Trunk/Postural Assessment  Cervical Assessment Cervical Assessment: Exceptions to WFL(forward cervical protraction) Thoracic Assessment Thoracic Assessment: Exceptions to WFL(slight thoracic kyphosis and shoulder rounding) Lumbar Assessment Lumbar Assessment: Exceptions to WFL(posterior pelvic tilt) Postural Control Postural Control: Deficits on evaluation  Balance Balance Balance Assessed: Yes Standardized Balance Assessment Standardized Balance Assessment: Berg Balance Test Berg Balance Test Sit to Stand: Able to stand  independently using hands Standing Unsupported: Able to stand safely 2 minutes Sitting with Back Unsupported but Feet Supported on Floor or Stool: Able to sit safely and securely 2 minutes Stand  to Sit: Controls descent by using hands Transfers: Able to transfer safely, definite need of hands Standing Unsupported with Eyes Closed: Able to stand 10 seconds safely Standing Ubsupported with Feet Together: Able to place feet together independently and stand for 1 minute with supervision From Standing, Reach Forward with Outstretched Arm: Can reach forward >12 cm safely (5") From Standing Position, Pick up Object from Floor: Able to pick up shoe, needs supervision From Standing Position, Turn to Look Behind Over each Shoulder: Looks behind from both sides and weight shifts well Turn 360 Degrees: Able to turn 360 degrees safely but slowly Standing Unsupported, Alternately Place Feet on Step/Stool: Needs assistance to keep from falling or unable to try Standing Unsupported, One Foot in Front: Loses balance while stepping or standing Standing on One Leg: Tries to lift leg/unable to hold 3 seconds but remains standing independently Total Score: 37 Static Sitting Balance Static Sitting - Balance Support: Feet supported Static Sitting - Level of Assistance: 7: Independent Dynamic Sitting Balance Dynamic Sitting - Level of Assistance: 6: Modified independent (Device/Increase time) Static Standing Balance Static Standing - Balance Support: During functional activity Static Standing - Level of Assistance: 6: Modified independent (Device/Increase time) Dynamic Standing Balance Dynamic Standing - Balance Support: No upper extremity supported Dynamic Standing - Level of Assistance: 6: Modified independent (Device/Increase time) Extremity/Trunk Assessment RUE Assessment RUE Assessment: Within Functional Limits General Strength Comments: strength WFLS LUE Assessment LUE Assessment: Exceptions to [facility] Passive Range of Motion (PROM) Comments: WFL Active Range of Motion (AROM) Comments: [facility] General Strength Comments: 4/5 throughout with slight ataxia noted in the LUE compared to the right, but does  not affect functional use with ADLs.   Alaster Asfaw OTR/L 04/28/2019, 12:25 PM

## 2019-04-28 NOTE — Progress Notes (Signed)
Physical Therapy Discharge Summary  Patient Details  Name: Brooke Collier MRN: [id number] Date of Birth: 1935-09-03  Today's Date: 04/28/2019 PT Individual Time: 5956-3875 and 1300-1400 PT Individual Time Calculation (min): 72 min and 60 min    Patient has met 9 of 10 long term goals due to improved activity tolerance, improved balance and increased strength.  Patient to discharge at an ambulatory level Modified Independent.   Patient's care partner is independent to provide the necessary  supervision as able  assistance at discharge. Pt's daughter has completed hand on family education and is able to provide supervision for safety as needed at home.   Reasons goals not met: Pt did not meet floor transfer goal of mod(I) as daughter can provide assistance as needed.  Recommendation:  Patient will benefit from ongoing skilled PT services in home health setting to continue to advance safe functional mobility, address ongoing impairments in balance, strength, and ambulation, and minimize fall risk.  Equipment: No equipment provided  Reasons for discharge: treatment goals met  Patient/family agrees with progress made and goals achieved: Yes  PT Treatment Interventions: Session 1: Pt seated in w/c upon PT arrival, agreeable to therapy tx and reports mild R knee pain with increased activity. Pt also reports feeling somewhat anxious this morning, therapist provided encouragement throughout session and techniques for anxiety management. This therapist discussed asking MD about neuropsych follow up as the pt reported this was very beneficial to her. RN Nona Dell) in/out to provide medication, during this therapist continued to provide d/c education. Sensory, strength, coordination examined as detailed below.  Pt ambulated x 200 ft with RW and Mod I this session to the gym. Pt ascended/descended 12 steps this session with R rail and step to pattern, supervision assist. Berg balance test completed  this session as detailed below, scored 37/56 and discussed these results with the pt. Pt transferred to w/c mod I and transported back to room/propelled her w/c x 50 ft with B LEs Mod I, left in w/c with needs in reach and chair alarm set. Left in care of NT.    Session 2: Pt received supine in bed, with complaint of mild R knee pain and bilateral ankle and knee soreness. Numerical rating not provided. Pt uses BLEs to propel w/c x300' to therapy gym. Pt performs gait on ramp and mulch at mod(I) with use of RW. Car transfer also performed at mod(I) with RW. Following seated rest break, pt performs dynamic balance training, shooting ball at basketball hoop and catching ball from toss by PT, x5 minutes. Pt progresses to performing on air-x mat to increase balance challenge, x5 minutes. Verbal cues provided to increase engagement of BLEs for strengthening and occasional CGA due to increased sway. Seated rest break prior to standing on tilt board and performing anterior/posterior weight shifts with CGA, and no LOBs. x20 reps.   PT address patient's mobility concerns for discharge and provides education on safe strategies for transfers and ambulation at night for toileting.  Pt taken in w/c back to room and left supine in bed with all needs within reach and alarm activated.  PT Discharge Precautions/Restrictions Precautions Precautions: Fall Restrictions Weight Bearing Restrictions: No Pain Pain Assessment Pain Scale: 0-10 Pain Score: 0-No pain Cognition Overall Cognitive Status: Within Functional Limits for tasks assessed Arousal/Alertness: Awake/alert Orientation Level: Oriented X4 Attention: Selective Selective Attention: Appears intact Memory: Appears intact Awareness: Appears intact Problem Solving: Appears intact Safety/Judgment: Appears intact Sensation Sensation Light Touch: Appears Intact Peripheral sensation comments:  Pt with on and off numbness in the left hand at  times. Hot/Cold: Appears Intact Proprioception: Appears Intact Stereognosis: Appears Intact Additional Comments: sensation grossly intact B LEs Coordination Gross Motor Movements are Fluid and Coordinated: Yes Fine Motor Movements are Fluid and Coordinated: Yes Coordination and Movement Description: Slight decreased coordination noted still in the left hand but only minor and does not affect functional use. Finger Nose Finger Test: slower rate of speed on the left compared to the right Heel Shin Test: [facility] Motor  Motor Motor - Skilled Clinical Observations: generalized weakness Motor - Discharge Observations: generalized weakness  Mobility Bed Mobility Bed Mobility: Rolling Right;Rolling Left;Supine to Sit;Sit to Supine Rolling Right: Independent Rolling Left: Independent Right Sidelying to Sit: Independent Supine to Sit: Independent Sit to Supine: Independent Transfers Transfers: Sit to Stand;Stand to Sit;Stand Pivot Transfers Sit to Stand: Independent with assistive device Stand to Sit: Independent with assistive device Stand Pivot Transfers: Independent with assistive device Transfer (Assistive device): Rolling walker Locomotion  Gait Ambulation: Yes Gait Assistance: Independent with assistive device Gait Distance (Feet): 150 Feet Assistive device: Rolling walker Gait Gait: Yes Gait Pattern: Within Functional Limits Gait velocity: decreased Stairs / Additional Locomotion Stairs: Yes Stairs Assistance: Supervision/Verbal cueing Stair Management Technique: One rail Right Number of Stairs: 12 Height of Stairs: 6 Wheelchair Mobility Wheelchair Mobility: No  Trunk/Postural Assessment  Cervical Assessment Cervical Assessment: Exceptions to WFL(forward head posture) Thoracic Assessment Thoracic Assessment: Exceptions to WFL(rounded shoulders) Lumbar Assessment Lumbar Assessment: Exceptions to WFL(posterior pelvic tilt in sitting) Postural Control Postural Control:  Deficits on evaluation  Balance Balance Balance Assessed: Yes Standardized Balance Assessment Standardized Balance Assessment: Berg Balance Test Berg Balance Test Sit to Stand: Able to stand  independently using hands Standing Unsupported: Able to stand safely 2 minutes Sitting with Back Unsupported but Feet Supported on Floor or Stool: Able to sit safely and securely 2 minutes Stand to Sit: Controls descent by using hands Transfers: Able to transfer safely, definite need of hands Standing Unsupported with Eyes Closed: Able to stand 10 seconds safely Standing Ubsupported with Feet Together: Able to place feet together independently and stand for 1 minute with supervision From Standing, Reach Forward with Outstretched Arm: Can reach forward >12 cm safely (5") From Standing Position, Pick up Object from Floor: Able to pick up shoe, needs supervision From Standing Position, Turn to Look Behind Over each Shoulder: Looks behind from both sides and weight shifts well Turn 360 Degrees: Able to turn 360 degrees safely but slowly Standing Unsupported, Alternately Place Feet on Step/Stool: Needs assistance to keep from falling or unable to try Standing Unsupported, One Foot in Front: Loses balance while stepping or standing Standing on One Leg: Tries to lift leg/unable to hold 3 seconds but remains standing independently Total Score: 37 Static Sitting Balance Static Sitting - Balance Support: Feet supported Static Sitting - Level of Assistance: 7: Independent Dynamic Sitting Balance Dynamic Sitting - Level of Assistance: 6: Modified independent (Device/Increase time) Static Standing Balance Static Standing - Balance Support: During functional activity Static Standing - Level of Assistance: 6: Modified independent (Device/Increase time) Dynamic Standing Balance Dynamic Standing - Balance Support: No upper extremity supported Dynamic Standing - Level of Assistance: 6: Modified independent  (Device/Increase time) Extremity Assessment  RLE Assessment General Strength Comments: generalized weakness, grossly 3+ to 4/5 throughout LLE Assessment General Strength Comments: generalized weakness, grossly 3+ to 4/5 throughout    Netta Corrigan, PT, DPT, CSRS 04/28/2019, 9:54 AM   Breck Coons,  PT, DPT

## 2019-04-28 NOTE — Progress Notes (Signed)
[place] PHYSICAL MEDICINE & REHABILITATION PROGRESS NOTE   Subjective/Complaints:   No new issues, discussed intermittent fatigue   ROS-  Pt denies SOB, abd pain, CP,N/V/D   Objective:   No results found. No results for input(s): WBC, HGB, HCT, PLT in the last 72 hours. Recent Labs    04/26/19 0536  CREATININE 0.82    Intake/Output Summary (Last 24 hours) at 04/28/2019 0849 Last data filed at 04/27/2019 1918 Gross per 24 hour  Intake 476 ml  Output --  Net 476 ml     Physical Exam: Vital Signs Blood pressure 138/83, pulse (!) 51, temperature 98.3 F (36.8 C), temperature source Oral, resp. rate 20, height 5\' 4"  (1.626 m), weight 44.1 kg, SpO2 100 %.     General: No acute distress Mood and affect are appropriate Heart: Regular rate and rhythm no rubs murmurs or extra sounds Lungs: Clear to auscultation, breathing unlabored, no rales or wheezes Abdomen: Positive bowel sounds, soft nontender to palpation, nondistended Extremities: No clubbing, cyanosis, or edema Skin: No evidence of breakdown, no evidence of rash Neurologic: Cranial nerves II through XII intact, motor strength is 5/5 in bilateral deltoid, bicep, tricep, grip, hip flexor, knee extensors, ankle dorsiflexor and plantar flexor Sensory exam normal sensation to light touch and proprioception in bilateral upper and lower extremities Cerebellar exam normal finger to nose to finger as well as heel to shin in bilateral upper and lower extremities Musculoskeletal: Full range of motion in all 4 extremities. No joint swelling      Assessment/Plan: 1. 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  Physiatrist is providing close team supervision and 24 hour management of active medical problems listed below.  Physiatrist and rehab team continue to assess barriers to discharge/monitor  patient progress toward functional and medical goals  Care Tool:  Bathing    Body parts bathed by patient: Right arm, Left arm, Chest, Abdomen, Front perineal area, Buttocks, Right upper leg, Left upper leg, Right lower leg, Left lower leg, Face         Bathing assist Assist Level: Independent with assistive device     Upper Body Dressing/Undressing Upper body dressing   What is the patient wearing?: Pull over shirt, Bra    Upper body assist Assist Level: Independent    Lower Body Dressing/Undressing Lower body dressing      What is the patient wearing?: Pants, Underwear/pull up     Lower body assist Assist for lower body dressing: Independent with assitive device     Toileting Toileting    Toileting assist Assist for toileting: Independent with assistive device Assistive Device Comment: RW   Transfers Chair/bed transfer  Transfers assist     Chair/bed transfer assist level: Independent with assistive device Chair/bed transfer assistive device: Programmer, multimedia   Ambulation assist      Assist level: Independent with assistive device Assistive device: Walker-rolling Max distance: 10'   Walk 10 feet activity   Assist     Assist level: Supervision/Verbal cueing Assistive device: Walker-rolling   Walk 50 feet activity   Assist    Assist level: Supervision/Verbal cueing Assistive device: Walker-rolling    Walk 150 feet activity   Assist Walk 150 feet activity did not occur: Safety/medical concerns(fatigue)  Assist level: Contact Guard/Touching assist Assistive device: Walker-rolling    Walk 10 feet on uneven surface  activity   Assist Walk 10  feet on uneven surfaces activity did not occur: Safety/medical concerns(fatigue)   Assist level: Minimal Assistance - Patient > 75% Assistive device: Hand held assist   Wheelchair     Assist Will patient use wheelchair at discharge?: No   Wheelchair activity did not  occur: N/A         Wheelchair 50 feet with 2 turns activity    Assist    Wheelchair 50 feet with 2 turns activity did not occur: N/A       Wheelchair 150 feet activity     Assist  Wheelchair 150 feet activity did not occur: N/A       Blood pressure 138/83, pulse (!) 51, temperature 98.3 F (36.8 C), temperature source Oral, resp. rate 20, height 5\' 4"  (1.626 m), weight 44.1 kg, SpO2 100 %.  Medical Problem List and Plan: 1.Lower extremity weaknesssecondary to bilateral thalamic lacunar infarct secondary to small vessel disease CIR PT, OT, ELOS 3/18 2. Antithrombotics: -DVT/anticoagulation:Lovenox- PLT normal  -antiplatelet therapy: Aspirin 81 mg daily, Plavix 75 mg daily x3 weeks then Plavix alone 3. Pain Management:Tylenol as needed, Urispas and ditropan for interstitial cystitis  4. Mood:BuSpar 5 mg twice daily, Xanax as needed took 0.5mg  1/2 tab qnoon and q 5pm at home -antipsychotic agents: N/A 5. Neuropsych: This patientiscapable of making decisions on herown behalf. 6. Skin/Wound Care:Routine skin checks 7. Fluids/Electrolytes/Nutrition:Routine in and outs with follow-up , BMET normal 8. Hypertension. Norvasc 2.5 mg daily,Avapro 150 mg daily. Monitor with increased mobility  Vitals:   04/27/19 1930 04/28/19 0313  BP: 140/69 138/83  Pulse: (!) 55 (!) 51  Resp:    Temp: 98.2 F (36.8 C) 98.3 F (36.8 C)  SpO2: 100% 100%  Controlled  3/17 9. Hyperlipidemia. Lipitor 10. Chronic interstitial cystitis. Increase ditropan to 5mg  , cont prn urispas  11. Dry eyes- ordered artifical tears prn for dry eyes- wanted prn.  3/14- working well per pt- con't  12.  Restless legs start requip   LOS: 9 days A FACE TO [employer] E Coulton Schlink 04/28/2019, 8:49 AM

## 2019-04-29 ENCOUNTER — Ambulatory Visit: Payer: Medicare HMO | Attending: Internal Medicine

## 2019-04-29 DIAGNOSIS — Z23 Encounter for immunization: Secondary | ICD-10-CM

## 2019-04-29 NOTE — Progress Notes (Addendum)
[place] PHYSICAL MEDICINE & REHABILITATION PROGRESS NOTE   Subjective/Complaints:  Patient does not feel like the bladder medications have been helpful for her.  She thinks that mainly avoiding foods that irritate her bladder has worked the best for her.  She is a little bit nervous about going home today  ROS-  Pt denies SOB, abd pain, CP,N/V/D   Objective:   No results found. No results for input(s): WBC, HGB, HCT, PLT in the last 72 hours. No results for input(s): NA, K, CL, CO2, GLUCOSE, BUN, CREATININE, CALCIUM in the last 72 hours.  Intake/Output Summary (Last 24 hours) at 04/29/2019 1115 Last data filed at 04/29/2019 0833 Gross per 24 hour  Intake 476 ml  Output --  Net 476 ml     Physical Exam: Vital Signs Blood pressure (!) 147/79, pulse (!) 56, temperature 98.3 F (36.8 C), temperature source Oral, resp. rate 17, height 5\' 4"  (1.626 m), weight 44.1 kg, SpO2 99 %.  General: No acute distress Mood and affect are appropriate Heart: Regular rate and rhythm no rubs murmurs or extra sounds Lungs: Clear to auscultation, breathing unlabored, no rales or wheezes Abdomen: Positive bowel sounds, soft nontender to palpation, nondistended Extremities: No clubbing, cyanosis, or edema Skin: No evidence of breakdown, no evidence of rash Neurologic: Cranial nerves II through XII intact, motor strength is 5/5 in bilateral deltoid, bicep, tricep, grip, hip flexor, knee extensors, ankle dorsiflexor and plantar flexor Sensory exam normal sensation to light touch and proprioception in bilateral upper and lower extremities Cerebellar exam normal finger to nose to finger as well as heel to shin in bilateral upper and lower extremities Musculoskeletal: Full range of motion in all 4 extremities. No joint swelling      Assessment/Plan: 1. Functional deficits secondary to Bilateral R>L thalamic infarct  Stable for D/C today F/u PCP in 3-4 weeks F/u PM&R 2 weeks See D/C  summary See D/C instructions Care Tool:  Bathing    Body parts bathed by patient: Right arm, Left arm, Chest, Abdomen, Front perineal area, Buttocks, Right upper leg, Left upper leg, Right lower leg, Left lower leg, Face         Bathing assist Assist Level: Independent with assistive device     Upper Body Dressing/Undressing Upper body dressing   What is the patient wearing?: Pull over shirt, Bra    Upper body assist Assist Level: Independent    Lower Body Dressing/Undressing Lower body dressing      What is the patient wearing?: Pants, Underwear/pull up     Lower body assist Assist for lower body dressing: Independent with assitive device     Toileting Toileting    Toileting assist Assist for toileting: Independent with assistive device Assistive Device Comment: RW   Transfers Chair/bed transfer  Transfers assist     Chair/bed transfer assist level: Independent with assistive device Chair/bed transfer assistive device: Programmer, multimedia   Ambulation assist      Assist level: Independent with assistive device Assistive device: Walker-rolling Max distance: 200'   Walk 10 feet activity   Assist     Assist level: Independent with assistive device Assistive device: Walker-rolling   Walk 50 feet activity   Assist    Assist level: Independent with assistive device Assistive device: Walker-rolling    Walk 150 feet activity   Assist Walk 150 feet activity did not occur: Safety/medical concerns(fatigue)  Assist level: Independent with assistive device Assistive device: Walker-rolling    Walk 10 feet  on uneven surface  activity   Assist Walk 10 feet on uneven surfaces activity did not occur: Safety/medical concerns(fatigue)   Assist level: Independent Assistive device: Aeronautical engineer Will patient use wheelchair at discharge?: No   Wheelchair activity did not occur: N/A          Wheelchair 50 feet with 2 turns activity    Assist    Wheelchair 50 feet with 2 turns activity did not occur: N/A       Wheelchair 150 feet activity     Assist  Wheelchair 150 feet activity did not occur: N/A       Blood pressure (!) 147/79, pulse (!) 56, temperature 98.3 F (36.8 C), temperature source Oral, resp. rate 17, height 5\' 4"  (1.626 m), weight 44.1 kg, SpO2 99 %.  Medical Problem List and Plan: 1.Lower extremity weaknesssecondary to bilateral thalamic lacunar infarct secondary to small vessel disease Stable for discharge to home 2. Antithrombotics: -DVT/anticoagulation:Lovenox- PLT normal  -antiplatelet therapy: Aspirin 81 mg daily, Plavix 75 mg daily x3 weeks then Plavix alone 3. Pain Management:Tylenol as needed, will give prescription for Urispas and Ditropan and give patient option to continue these if she feels like they have been helpful 4. Mood:BuSpar 5 mg twice daily, Xanax as needed took 0.5mg  1/2 tab qnoon and q 5pm at home -antipsychotic agents: N/A 5. Neuropsych: This patientiscapable of making decisions on herown behalf. 6. Skin/Wound Care:Routine skin checks 7. Fluids/Electrolytes/Nutrition:Routine in and outs with follow-up , BMET normal 8. Hypertension. Norvasc 2.5 mg daily,Avapro 150 mg daily. Monitor with increased mobility  Vitals:   04/28/19 1937 04/29/19 0339  BP: 130/79 (!) 147/79  Pulse: (!) 59 (!) 56  Resp:    Temp: 98.1 F (36.7 C) 98.3 F (36.8 C)  SpO2: 96% 99%  Controlled  3/18 9. Hyperlipidemia. Lipitor 10. Chronic interstitial cystitis. Follow-up with urology Dr. Amalia Hailey 11. Dry eyes- ordered artifical tears prn for dry eyes- wanted prn.  3/14- working well per pt- con't  12.  Restless legs not sure if Requip "was working.  She thinks she has some at home she will check her medication cabinet   LOS: 10 days A FACE TO FACE EVALUATION WAS PERFORMED  Charlett Blake 04/29/2019, 11:15 AM

## 2019-04-29 NOTE — Progress Notes (Signed)
Team Conference Report to Patient/Family  Team Conference discussion was reviewed with the patient , including goals, any changes in plan of care and target discharge date.  Patient and expressed understanding and is in agreement.  The patient is scheduled for discharge today. Daughter is coming in to get her and then take her to get a COVID vaccine. Patient reports she is ready but nervous and anxious about going out into the community again. She feels safe in the hospital. [facility] services discussed and [facility] will be following the patient. Dorien Chihuahua B 04/29/2019, 3:33 PM

## 2019-04-29 NOTE — Progress Notes (Signed)
   Covid-19 Vaccination Clinic  Name:  Brooke Collier    MRN: [id number] DOB: Jun 24, 1935  04/29/2019  Ms. [name] was observed post Covid-19 immunization for 15 minutes without incident. She was provided with Vaccine Information Sheet and instruction to access the V-Safe system.   Ms. [name] was instructed to call 911 with any severe reactions post vaccine: [name] Difficulty breathing  . Swelling of face and throat  . A fast heartbeat  . A bad rash all over body  . Dizziness and weakness   Immunizations Administered    Name Date Dose VIS Date Route   Pfizer COVID-19 Vaccine 04/29/2019 12:45 PM 0.3 mL 01/22/2019 Intramuscular   Manufacturer: [place]   Lot: [id number]   [place]: [id number]

## 2019-04-29 NOTE — Care Management (Signed)
   The overall goal for the admission was met for:   Discharge location: Home with daughter assisting prn  Length of Stay: 10 days with discharge 04/29/19  Discharge activity level:Patient to discharge at overall Modified Independent level.    Home/community participation: Yes; Limited participation  Services provided included: MD, RD, PT, OT, RN, CM, Pharmacy, Neuropsych and SW  Financial Services: Medicare  Follow-up services arranged: Home Health: [facility] PT, OT with [facility] and Patient/Family has no preference for HH/DME agencies  Comments (or additional information): [place]  Patient/Family verbalized understanding of follow-up arrangements: Yes   Individual responsible for coordination of the follow-up plan: Daughter April Uzzle-Little [phone]  Patient has DME; no recommendations for additional DME  Margarito Liner

## 2019-04-29 NOTE — Discharge Instructions (Signed)
Inpatient Rehab Discharge Instructions  Brooke Collier Discharge date and time: No discharge date for patient encounter.   Activities/Precautions/ Functional Status: Activity: activity as tolerated Diet: regular diet Wound Care: none needed Functional status:  ___ No restrictions     ___ Walk up steps independently ___ 24/7 supervision/assistance   ___ Walk up steps with assistance ___ Intermittent supervision/assistance  ___ Bathe/dress independently ___ Walk with walker     __x_ Bathe/dress with assistance ___ Walk Independently    ___ Shower independently ___ Walk with assistance    ___ Shower with assistance ___ No alcohol     ___ Return to work/school ________  COMMUNITY REFERRALS UPON DISCHARGE:  Home Health: PT, OT  Agency:[facility] Phone:[phone]  Special Instructions: No driving smoking or alcohol  Continue aspirin 81 mg daily through 05/06/2019 and stop  STROKE/TIA DISCHARGE INSTRUCTIONS Cigarette smoking nearly doubles your risk of having a stroke & is the single most alterable risk factor   Most of the excess cardiovascular risk related to smoking disappears within a year of stopping.  Ask you doctor about anti-smoking medications  [place] Quit Line: 1-800-QUIT NOW  Free Smoking Cessation Classes [phone]  Know your levels; limit fat & cholesterol in your diet   Many patients benefit from treatment even if their cholesterol is at goal.  Goal: Total Cholesterol (CHOL) less than 160  Goal:  Triglycerides (TRIG) less than 150  Goal:  HDL greater than 40  Goal:  LDL (LDLCALC) less than 100   American Stroke Association blood pressure target is less that 120/80 mm/Hg   Monitor your blood pressure  Limit your salt and alcohol intake  Many individuals will require more than one medication for high blood pressure  Goal HGBA1c is under 7% (HBGA1c is blood sugar average for last 3 months)   Your HGBA1c can be lowered with medications,  healthy diet, and exercise.  Check your blood sugar as directed by your physician  Call your physician if you experience unexplained or low blood sugars.  Goal is 30 minutes at least 4 days per week   Activity decreases your risk of heart attack and stroke and makes your heart stronger.  It helps control your weight and blood pressure; helps you relax and can improve your mood.  Participate in a regular exercise program.  Talk with your doctor about the best form of exercise for you (dancing, walking, swimming, cycling).  Goal is to maintain a healthy weight   Following the type of diet specifically designed for you will help prevent another stroke.  Your goal weight range is:    Your goal Body Mass Index (BMI) is 19-24.  Healthy food habits can help reduce 3 risk factors for stroke:  High cholesterol, hypertension, and excess weight.  Stroke/Support Group:  Call [phone]   Stroke warning signs and symptoms How to activate emergency medical system (call 911). Medications prescribed at discharge. Need for follow-up after discharge. Personal risk factors for stroke. Pneumonia vaccine given:  Flu vaccine given:  My questions have been answered, the writing is legible, and I understand these instructions.  I will adhere to these goals & educational materials that have been provided to me after my discharge from the hospital.      My questions have been answered and I understand these instructions. I will adhere to these goals and the provided educational materials after my discharge from the hospital.  Patient/Caregiver Signature _______________________________ Date __________  Clinician Signature _______________________________________ Date __________  Please bring this form and your medication list with you to all your follow-up doctor's appointments.

## 2019-04-29 NOTE — Progress Notes (Signed)
Pt discharged home with family. Discharge instructions given Gelene Mink, [place]. No further questions from pt or family. Belongings sent with pt. Pt stable at time of discharge.   Gerald Stabs, RN

## 2019-04-29 NOTE — Plan of Care (Signed)
  Problem: Consults Goal: RH STROKE PATIENT EDUCATION Description: Patient will be able to independently discuss stroke education  Outcome: Progressing Goal: Nutrition Consult-if indicated Outcome: Progressing Goal: Diabetes Guidelines if Diabetic/Glucose > 140 Description: If diabetic or lab glucose is > 140 mg/dl - Initiate Diabetes/Hyperglycemia Guidelines & Document Interventions  Outcome: Progressing   Problem: RH BOWEL ELIMINATION Goal: RH STG MANAGE BOWEL WITH ASSISTANCE Description: STG Manage Bowel with supervision Outcome: Progressing Goal: RH STG MANAGE BOWEL W/MEDICATION W/ASSISTANCE Description: STG Manage Bowel with Medication independently  Outcome: Progressing   Problem: RH BLADDER ELIMINATION Goal: RH STG MANAGE BLADDER WITH ASSISTANCE Description: STG Manage Bladder With supervision  Outcome: Progressing Goal: RH STG MANAGE BLADDER WITH MEDICATION WITH ASSISTANCE Description: STG Manage Bladder With Medication independently  Outcome: Progressing Goal: RH STG MANAGE BLADDER WITH EQUIPMENT WITH ASSISTANCE Description: STG Manage Bladder With Equipment independently  Outcome: Progressing   Problem: RH SKIN INTEGRITY Goal: RH STG SKIN FREE OF INFECTION/BREAKDOWN Outcome: Progressing Goal: RH STG MAINTAIN SKIN INTEGRITY WITH ASSISTANCE Description: STG Maintain Skin Integrity With supervision  Outcome: Progressing Goal: RH STG ABLE TO PERFORM INCISION/WOUND CARE W/ASSISTANCE Description: STG Able To Perform Incision/Wound care with supervision  Outcome: Progressing   Problem: RH SAFETY Goal: RH STG ADHERE TO SAFETY PRECAUTIONS W/ASSISTANCE/DEVICE Description: STG Adhere to Safety Precautions With Assistance/Device using modified independence  Outcome: Progressing Goal: RH STG DECREASED RISK OF FALL WITH ASSISTANCE Description: STG Decreased Risk of Fall With mod I  Outcome: Progressing   Problem: RH COGNITION-NURSING Goal: RH STG USES MEMORY  AIDS/STRATEGIES W/ASSIST TO PROBLEM SOLVE Description: STG Uses Memory Aids/Strategies With mod I to Problem Solve. Outcome: Progressing Goal: RH STG ANTICIPATES NEEDS/CALLS FOR ASSIST W/ASSIST/CUES Description: STG Anticipates Needs/Calls for Assist With mod I  Outcome: Progressing   Problem: RH PAIN MANAGEMENT Goal: RH STG PAIN MANAGED AT OR BELOW PT'S PAIN GOAL Description: Patients pain will remain below 5 for remainder of stay Outcome: Progressing   Problem: RH KNOWLEDGE DEFICIT Goal: RH STG INCREASE KNOWLEDGE OF DIABETES Outcome: Progressing Goal: RH STG INCREASE KNOWLEDGE OF HYPERTENSION Outcome: Progressing Goal: RH STG INCREASE KNOWLEDGE OF DYSPHAGIA/FLUID INTAKE Outcome: Progressing Goal: RH STG INCREASE KNOWLEGDE OF HYPERLIPIDEMIA Outcome: Progressing Goal: RH STG INCREASE KNOWLEDGE OF STROKE PROPHYLAXIS Outcome: Progressing

## 2019-04-30 ENCOUNTER — Telehealth: Payer: Self-pay | Admitting: Registered Nurse

## 2019-04-30 NOTE — Telephone Encounter (Signed)
Transitional Care call  Transitional Questions answered by son Mr. Brooke Collier  Patient name: Brooke Collier  DOB: 30-Aug-1935 1. Are you/is patient experiencing any problems since coming home? No a. Are there any questions regarding any aspect of care? No 2. Are there any questions regarding medications administration/dosing? No a. Are meds being taken as prescribed? Yes b. "Patient should review meds with caller to confirm" Medication List Reviewed.  3. Have there been any falls? No 4. Has Home Health been to the house and/or have they contacted you? No a. If not, have you tried to contact them? No, Brooke Collier was given Brooke Collier number and was instructed to call office if he doesn't hear from them by Monday 05/03/2019, he verbalizes understanding.  b. Can we help you contact them? No 5. Are bowels and bladder emptying properly? Yes a. Are there any unexpected incontinence issues? No  b. If applicable, is patient following bowel/bladder programs? No 6. Any fevers, problems with breathing, unexpected pain? No 7. Are there any skin problems or new areas of breakdown? No 8. Has the patient/family member arranged specialty MD follow up (ie cardiology/neurology/renal/surgical/etc.)?  Brooke Collier instructed to call [facility] to schedule HFU appointment and PCP he verbalizes understanding.  a. Can we help arrange? No 9. Does the patient need any other services or support that we can help arrange? No 10. Are caregivers following through as expected in assisting the patient? Yes 11. Has the patient quit smoking, drinking alcohol, or using drugs as recommended? (                        )  Appointment date/time 05/07/2019  arrival time 1:40 for 2:00 appointment with Dr Letta Pate. At [place]

## 2019-04-30 NOTE — Telephone Encounter (Signed)
Placed a call to Ms. Little ( daughter) of Ms. Civil, no answer. Left message to return the call.

## 2019-05-04 ENCOUNTER — Telehealth: Payer: Self-pay

## 2019-05-04 NOTE — Telephone Encounter (Signed)
The aspirin may be discontinued on March 25.  It is in the acute care discharge summary

## 2019-05-04 NOTE — Telephone Encounter (Signed)
Brooke Collier with PT [facility] wants to discuss PT arrangements--4:21 PM [phone]

## 2019-05-04 NOTE — Telephone Encounter (Signed)
Patients husband Ulice Dash called and said he thought they are supposed to discontinue aspirin in 3 weeks - maybe the 25th -cannot find it in documentation from [place].  Phone [phone]

## 2019-05-04 NOTE — Telephone Encounter (Signed)
Orders approved and given to [place], PT from [facility]. 2wk3, 1wk5.

## 2019-05-05 DIAGNOSIS — N3941 Urge incontinence: Secondary | ICD-10-CM | POA: Insufficient documentation

## 2019-05-05 DIAGNOSIS — Z8673 Personal history of transient ischemic attack (TIA), and cerebral infarction without residual deficits: Secondary | ICD-10-CM | POA: Insufficient documentation

## 2019-05-05 NOTE — Telephone Encounter (Signed)
Left message on Brooke Collier's VM to discontinue the aspirin on 05/06/2019

## 2019-05-07 ENCOUNTER — Encounter: Payer: Self-pay | Admitting: Physical Medicine & Rehabilitation

## 2019-05-07 ENCOUNTER — Other Ambulatory Visit: Payer: Self-pay

## 2019-05-07 ENCOUNTER — Encounter: Payer: Medicare HMO | Attending: Physical Medicine & Rehabilitation | Admitting: Physical Medicine & Rehabilitation

## 2019-05-07 VITALS — BP 129/67 | HR 60 | Temp 97.9°F | Ht 64.0 in | Wt 100.0 lb

## 2019-05-07 DIAGNOSIS — I69393 Ataxia following cerebral infarction: Secondary | ICD-10-CM | POA: Diagnosis not present

## 2019-05-07 DIAGNOSIS — I69398 Other sequelae of cerebral infarction: Secondary | ICD-10-CM | POA: Insufficient documentation

## 2019-05-07 DIAGNOSIS — R269 Unspecified abnormalities of gait and mobility: Secondary | ICD-10-CM

## 2019-05-07 DIAGNOSIS — N3030 Trigonitis without hematuria: Secondary | ICD-10-CM | POA: Diagnosis not present

## 2019-05-07 NOTE — Patient Instructions (Addendum)
Please discontinue baby aspirin Please make appt with Dr Lawrence Santiago for interstitial cystitis  Make sure see Clemmie Krill Cue from Neurology dept  Please make appt with Dr Sarajane Jews  Cont Oxybutnin to slow down bladder  Plan on Outpt PT at [facility]- same building as neurology

## 2019-05-07 NOTE — Progress Notes (Signed)
Subjective:    Patient ID: Brooke Collier, female    DOB: [birth year], 84 y.o.   MRN: [id number] 83 y.o. right-handed female with history of hypertension, anxiety, chronic interstitial cystitis.  Patient lives alone independent prior to admission to [facility].  Presented 04/14/2019 lower extremity weakness.  Blood pressure 170/79.  Admission chemistries unremarkable, SARS coronavirus negative.  Cranial CT scan unremarkable for acute intracranial process.  Patient did not receive TPA.  MRI showed acute on chronic small vessel ischemia in the thalami.  An 8 mm right lateral thalamic internal capsule lacunar noted along with evidence of punctate contralateral left ventral thalamic infarction.  CT angiogram of head and neck showed generalized Ectasia with mild atherosclerotic changes with 50% left supraclinoid ICA stenosis only.  Echocardiogram with ejection fraction of 65% without emboli.  Aspirin and Plavix added for CVA prophylaxis x3 weeks then Plavix alone.    Admit date: 04/19/2019 Discharge date: 04/29/2019 Transitional care visit Transitional care phone call completed 04/30/2019 HPI The patient has returned to home after discharge from inpatient rehabilitation.  Orders for home health PT were clarified on 05/04/2019 via phone call.  Also we called the patient to inform them to discontinue aspirin on 05/06/2019. The patient no longer sees Dr. Sarajane Jews but instead sees Dr. Ruben Gottron from [facility] HHPT continues [facility] pt sent away due to fatigue today   Patient still has issues with anxiety but mainly takes BuSpar 5 mg twice daily.  She did on 1 occasion take Xanax 0.125 mg  Having home health therapist come into her house makes her anxious.  No falls at home  May visit her son in [place] to have problems with frequent urination mainly at night although it is better when she takes her oxybutynin.  She takes Bard Herbert on a as needed basis. In addition she has had  some problem with restless legs although this is better on the low-dose Requip.  We discussed that the dose may be increased if needed. Pain Inventory Average Pain 4 Pain Right Now 3 My pain is tingling and aching  In the last 24 hours, has pain interfered with the following? General activity 3 Relation with others 3 Enjoyment of life 3 What TIME of day is your pain at its worst? evening Sleep (in general) Fair  Pain is worse with: walking Pain improves with: medication Relief from Meds: 8  Mobility use a walker use a wheelchair  Function retired  Neuro/Psych weakness numbness tingling dizziness anxiety  Prior Studies Any changes since last visit?  no  Physicians involved in your care Any changes since last visit?  no   Family History  Problem Relation Age of Onset  . Heart disease Mother   . Heart disease Father   . Anxiety disorder Sister   . Heart disease Brother   . Anxiety disorder Brother    Social History   Socioeconomic History  . Marital status: Single    Spouse name: Not on file  . Number of children: Not on file  . Years of education: Not on file  . Highest education level: Not on file  Occupational History  . Not on file  Tobacco Use  . Smoking status: Never Smoker  . Smokeless tobacco: Never Used  Substance and Sexual Activity  . Alcohol use: No  . Drug use: No  . Sexual activity: Not Currently    Birth control/protection: Post-menopausal  Other Topics Concern  . Not on file  Social History Narrative  . Not on file   Social Determinants of Health   Financial Resource Strain:   . Difficulty of Paying Living Expenses:   Food Insecurity:   . Worried About Charity fundraiser in the Last Year:   . Arboriculturist in the Last Year:   Transportation Needs:   . Film/video editor (Medical):   [name] Lack of Transportation (Non-Medical):   Physical Activity:   . Days of Exercise per Week:   . Minutes of Exercise per Session:     Stress:   . Feeling of Stress :   Social Connections:   . Frequency of Communication with Friends and Family:   . Frequency of Social Gatherings with Friends and Family:   . Attends Religious Services:   . Active Member of Clubs or Organizations:   . Attends Archivist Meetings:   [name] Marital Status:    Past Surgical History:  Procedure Laterality Date  . ABDOMINAL HYSTERECTOMY    . INTRAMEDULLARY (IM) NAIL INTERTROCHANTERIC  05/04/2016  . INTRAMEDULLARY (IM) NAIL INTERTROCHANTERIC Left 05/04/2016   Procedure: INTRAMEDULLARY (IM) NAIL INTERTROCHANTRIC;  Surgeon: Mcarthur Rossetti, MD;  Location: [facility];  Service: Orthopedics;  Laterality: Left;  . tonsillectomyy     Past Medical History:  Diagnosis Date  . Allergy   . Chronic interstitial cystitis   . Hypertension    BP 129/67   Pulse 60   Temp 97.9 F (36.6 C)   Ht 5\' 4"  (1.626 m)   Wt 100 lb (45.4 kg)   SpO2 97%   BMI 17.16 kg/m   Opioid Risk Score:   Fall Risk Score:  `1  Depression screen PHQ 2/9  No flowsheet data found.   Review of Systems  Constitutional: Negative.   HENT: Negative.   Eyes: Negative.   Respiratory: Negative.   Cardiovascular: Negative.   Gastrointestinal: Positive for nausea.  Endocrine: Negative.   Genitourinary: Positive for difficulty urinating.  Musculoskeletal: Positive for arthralgias and myalgias.  Skin: Negative.   Allergic/Immunologic: Negative.   Neurological: Positive for dizziness and numbness.  Hematological: Negative.   Psychiatric/Behavioral: Positive for dysphoric mood. The patient is nervous/anxious.   All other systems reviewed and are negative.      Objective:   Physical Exam Vitals and nursing note reviewed.  Constitutional:      Appearance: Normal appearance.  HENT:     Head: Normocephalic and atraumatic.  Eyes:     Extraocular Movements: Extraocular movements intact.     Conjunctiva/sclera: Conjunctivae normal.     Pupils: Pupils are  equal, round, and reactive to light.  Neurological:     Mental Status: She is alert and oriented to person, place, and time.     Sensory: Sensation is intact.     Coordination: Coordination abnormal. Finger-Nose-Finger Test abnormal. Rapid alternating movements normal.     Gait: Gait abnormal and tandem walk abnormal.     Comments: Motor strength is 5/5 bilateral deltoid, bicep, tricep, grip, hip flexor, knee extensor, ankle dorsiflexor and plantar flexor Tone is normal There is mild dysmetria left finger-nose-finger intact heel-to-shin bilaterally Positive Romberg She is unable to stand with her feet touching together even with her eyes open she tends to fall towards the right side. She is able to ambulate with bilateral hand-held assistance.  She tends to lean toward the right  Psychiatric:        Mood and Affect: Mood normal.  Behavior: Behavior normal.           Assessment & Plan:  #1.  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  Patient also had left thalamic infarct but do not see any abnormalities on her physical exam to indicate any clinical effect. She has discontinued the aspirin will remain on Plavix 75 mg/day and will follow-up with neurology, [facility] follow-up in 6 weeks, may transition to outpatient therapy, mainly physical therapy will be needed  2.  Hypertension well controlled continue amlodipine 2.5 mg daily as well as Avapro 150 mg/day follow-up with primary care physician  3.  Interstitial cystitis continue oxybutynin at night and as needed Urispas.  She will follow-up with Dr. Lawrence Santiago.  4.  Chronic anxiety continue BuSpar 5 mg twice daily, does have small dose of Xanax that she takes occasionally.  She can to need this and follow-up with her primary care.  Certainly if she has more issues with this we can have her see neuropsychology at our office,Dr  Kriste Basque

## 2019-05-10 ENCOUNTER — Telehealth: Payer: Self-pay

## 2019-05-10 NOTE — Telephone Encounter (Signed)
Brooke Collier calling for patient. She is having some seasonal allergies. Is it okay to take Claritin?

## 2019-05-10 NOTE — Telephone Encounter (Signed)
Okay for this patient to take Claritin

## 2019-05-11 NOTE — Telephone Encounter (Signed)
Ulice Dash notified

## 2019-05-13 ENCOUNTER — Telehealth: Payer: Self-pay | Admitting: *Deleted

## 2019-05-13 NOTE — Telephone Encounter (Signed)
Mrs Capurro son Ulice Dash called with a question about some symptoms she is having. He reports that she is having sensitivity to sound with her ears, and light sensitivity with her eyes, and is wondering if this is part of the stroke. Please advise

## 2019-05-13 NOTE — Telephone Encounter (Signed)
I do not think the light and sound hypersensitivity is related to the stroke itself.  The light sensitivity may be related to her oxybutynin which may dilate the eyes a little.  This is for bladder frequency at night.  You can try stopping this and see whether the light sensitivity improves.

## 2019-05-18 NOTE — Telephone Encounter (Signed)
I left a detailed message for the son that called about the sensitivities. He is on her DPR.

## 2019-05-24 ENCOUNTER — Telehealth: Payer: Self-pay

## 2019-05-24 ENCOUNTER — Ambulatory Visit: Payer: Medicare HMO | Attending: Internal Medicine

## 2019-05-24 DIAGNOSIS — Z23 Encounter for immunization: Secondary | ICD-10-CM

## 2019-05-24 NOTE — Telephone Encounter (Signed)
Patient called stating her back started hurting yesterday. Will like a call back however she has an appt at 11:00 and will return home after appt.

## 2019-05-24 NOTE — Progress Notes (Signed)
   Covid-19 Vaccination Clinic  Name:  Brooke Collier    MRN: [id number] DOB: June 06, 1935  05/24/2019  [name] was observed post Covid-19 immunization for 15 minutes without incident. She was provided with Vaccine Information Sheet and instruction to access the V-Safe system.   Ms. Town was instructed to call 911 with any severe reactions post vaccine: [name] Difficulty breathing  . Swelling of face and throat  . A fast heartbeat  . A bad rash all over body  . Dizziness and weakness   Immunizations Administered    Name Date Dose VIS Date Route   Pfizer COVID-19 Vaccine 05/24/2019 11:17 AM 0.3 mL 01/22/2019 Intramuscular   Manufacturer: [place]   Lot: [id number]   [place]: [id number]

## 2019-05-26 NOTE — Telephone Encounter (Signed)
Patient has resolved her pain problem and she states the pharmacy will be sending [place] refill request on her medications. I told her we will review the medications and refill what we can if not will ask pharmacy to send request to PCP.

## 2019-05-31 ENCOUNTER — Inpatient Hospital Stay: Payer: Medicare HMO | Admitting: Adult Health

## 2019-05-31 ENCOUNTER — Other Ambulatory Visit: Payer: Self-pay | Admitting: Physical Medicine and Rehabilitation

## 2019-05-31 ENCOUNTER — Telehealth: Payer: Self-pay

## 2019-05-31 MED ORDER — AMLODIPINE BESYLATE 2.5 MG PO TABS: 2.5000 mg | ORAL_TABLET | Freq: Every day | ORAL | 0 refills | Status: AC

## 2019-05-31 MED ORDER — CARBOXYMETHYLCELLULOSE SODIUM 0.5 % OP SOLN: 1.0000 [drp] | Freq: Every day | OPHTHALMIC | 3 refills | Status: AC | PRN

## 2019-05-31 MED ORDER — OXYBUTYNIN CHLORIDE 5 MG PO TABS: 5.0000 mg | ORAL_TABLET | Freq: Every day | ORAL | 0 refills | Status: DC

## 2019-05-31 MED ORDER — CLOPIDOGREL BISULFATE 75 MG PO TABS: 75.0000 mg | ORAL_TABLET | Freq: Every day | ORAL | 1 refills | Status: DC

## 2019-05-31 MED ORDER — CYSTEX URINARY HEALTH PO LIQD: 1.0000 | Freq: Two times a day (BID) | ORAL | 5 refills | Status: AC

## 2019-05-31 MED ORDER — DICLOFENAC SODIUM 1 % EX GEL: 2.0000 g | Freq: Four times a day (QID) | CUTANEOUS | 1 refills | Status: AC

## 2019-05-31 MED ORDER — IRBESARTAN 150 MG PO TABS: 150.0000 mg | ORAL_TABLET | Freq: Every day | ORAL | 0 refills | Status: DC

## 2019-05-31 MED ORDER — ALPRAZOLAM 0.25 MG PO TABS: 0.1250 mg | ORAL_TABLET | Freq: Every evening | ORAL | 0 refills | Status: AC | PRN

## 2019-05-31 MED ORDER — CO-ENZYME Q-10 30 MG PO CAPS: 30.0000 mg | ORAL_CAPSULE | Freq: Every day | ORAL | 5 refills | Status: AC

## 2019-05-31 MED ORDER — BUSPIRONE HCL 5 MG PO TABS: 5.0000 mg | ORAL_TABLET | Freq: Two times a day (BID) | ORAL | 0 refills | Status: DC

## 2019-05-31 MED ORDER — ROPINIROLE HCL 0.25 MG PO TABS: 0.2500 mg | ORAL_TABLET | Freq: Every day | ORAL | 0 refills | Status: DC

## 2019-05-31 MED ORDER — ATORVASTATIN CALCIUM 40 MG PO TABS: 40.0000 mg | ORAL_TABLET | Freq: Every day | ORAL | 0 refills | Status: DC

## 2019-05-31 MED ORDER — FLAVOXATE HCL 100 MG PO TABS: 100.0000 mg | ORAL_TABLET | Freq: Two times a day (BID) | ORAL | 0 refills | Status: AC | PRN

## 2019-05-31 MED ORDER — MAGNESIUM 100 MG PO TABS: 100.0000 mg | ORAL_TABLET | Freq: Every day | ORAL | 5 refills | Status: AC

## 2019-05-31 NOTE — Telephone Encounter (Signed)
Medications reviewed and I am ok to refill; if you could let family know that would be great; thank you!

## 2019-05-31 NOTE — Telephone Encounter (Signed)
Ptn daughter called [phone] at 4:25pm and son called [phone] at 8:03 am -- states PCP will not refill ptns RX because it was prescribed in hospital --- her number is [phone] his number Ulice Dash) [phone]---  [facility] is pharmacy of record--- Dr. Ranell Patrick can you please review these meds - as [place] is OOO and assist family please - let us know if we need to contact family back.

## 2019-05-31 NOTE — Telephone Encounter (Signed)
Left message for daughter that medications have been filled.

## 2019-05-31 NOTE — Telephone Encounter (Signed)
He states 13 diff meds - bladder BP lipitor -  Etc....Marland KitchenMarland Kitchen

## 2019-06-02 ENCOUNTER — Telehealth: Payer: Self-pay | Admitting: *Deleted

## 2019-06-02 NOTE — Telephone Encounter (Signed)
Requesting PT POC of 1wk4.  Approval given.

## 2019-06-03 ENCOUNTER — Ambulatory Visit: Payer: Medicare HMO | Admitting: Adult Health

## 2019-06-03 ENCOUNTER — Other Ambulatory Visit: Payer: Self-pay

## 2019-06-03 ENCOUNTER — Encounter: Payer: Self-pay | Admitting: Adult Health

## 2019-06-03 VITALS — BP 132/70 | HR 60 | Temp 97.6°F | Ht 64.0 in | Wt 98.2 lb

## 2019-06-03 DIAGNOSIS — E785 Hyperlipidemia, unspecified: Secondary | ICD-10-CM | POA: Diagnosis not present

## 2019-06-03 DIAGNOSIS — F419 Anxiety disorder, unspecified: Secondary | ICD-10-CM | POA: Diagnosis not present

## 2019-06-03 DIAGNOSIS — I639 Cerebral infarction, unspecified: Secondary | ICD-10-CM | POA: Diagnosis not present

## 2019-06-03 DIAGNOSIS — I6381 Other cerebral infarction due to occlusion or stenosis of small artery: Secondary | ICD-10-CM

## 2019-06-03 DIAGNOSIS — F329 Major depressive disorder, single episode, unspecified: Secondary | ICD-10-CM

## 2019-06-03 DIAGNOSIS — I10 Essential (primary) hypertension: Secondary | ICD-10-CM

## 2019-06-03 MED ORDER — ESCITALOPRAM OXALATE 5 MG PO TABS: 5.0000 mg | ORAL_TABLET | Freq: Every day | ORAL | 3 refills | Status: AC

## 2019-06-03 NOTE — Patient Instructions (Addendum)
Start Lexapro 5mg  for anxiety and panic concerns - continue for 2 weeks and if tolerating, recommend increasing further if needed  Would consider decreasing buspar to hopeful complete discontinuing as well as xanax if able in the future  Continue to work with therapies and if needed, we could pursue additional outpatient therapies   Continue clopidogrel 75 mg daily  and atorvastatin for secondary stroke prevention  Continue to follow up with PCP regarding cholesterol and blood pressure management   Continue to monitor blood pressure at home  Maintain strict control of hypertension with blood pressure goal below 130/90, diabetes with hemoglobin A1c goal below 6.5% and cholesterol with LDL cholesterol (bad cholesterol) goal below 70 mg/dL. I also advised the patient to eat a healthy diet with plenty of whole grains, cereals, fruits and vegetables, exercise regularly and maintain ideal body weight.  Followup in the future with me in 3 months or call earlier if needed 8       Thank you for coming to see [place] at [facility]. I hope we have been able to provide you high quality care today.  You may receive a patient satisfaction survey over the next few weeks. We would appreciate your feedback and comments so that we may continue to improve ourselves and the health of our patients.

## 2019-06-03 NOTE — Progress Notes (Signed)
[facility] [address] [place]. [place] [address] [phone]       HOSPITAL FOLLOW UP NOTE  Ms. Brooke Collier Date of Birth:  1935/05/06 Medical Record Number:  [id number]   Reason for Referral:  hospital stroke follow up    SUBJECTIVE:   CHIEF COMPLAINT:  Chief Complaint  Patient presents with  . Hospitalization Follow-up    rm 9, Ulice Dash, son.  [facility] (PT/OT).  To bring HCPOA.  pcp updated.  . Cerebrovascular Accident    HPI:   Ms. Brooke Collier is a 84 y.o. female with history of hypertension, anxiety and interstitial cystitis who presented on 04/14/2019 with LE weakness, possibly worse on the L in setting of SBP > 200.    Stroke work-up revealed bilateral thalamic lacunar infarcts secondary to small vessel disease.  Recommended DAPT for 3 weeks and Plavix alone as previously on aspirin.  BP stabilized during admission and recommended long-term BP goal normotensive range.  LDL 109 initiate atorvastatin 40 mg daily.  No prior history of stroke.  Evaluated by therapies and recommended CIR upon discharge.   Stroke:  bilateral thalamic lacunar infarcts secondary to small vessel disease    Code Stroke CT head No acute abnormality. Small vessel disease. ASPECTS 10.     MRI  Acute on chronic thalamic small vessel disease. R thalamic lacune and punctate L thalamic infarct.  Chronic micro hemorrhages and small vessel disease.   CTA head & neck no LVO. Generalized mild ectasia and atherosclerosis head and neck . L ICA siphon 50% calcified plaque.   2D Echo  EF 65% without cardiac source of embolus identified  LDL 109 -initiated atorvastatin  HgbA1c 5.0  Lovenox 30 mg sq daily for VTE prophylaxis  aspirin 325 mg daily prior to admission, now on aspirin 325 mg daily. Given  mild stroke, recommend aspirin 81 mg and plavix 75 mg daily x 3 weeks, then PLAVIX alone. Orders adjusted.   Therapy recommendations:  CIR   Today, 06/03/2019, Ms.  [name] is being seen for hospital follow-up accompanied by her son.  Residual deficits of left-sided dysmetria, gait impairment and left leg numbness with ongoing improvement.  Continues to participate in home health PT with ongoing improvement of ambulation.  Continues to use rolling walker at all times.  She does live in her own home but has family staying with her majority of the day.  Completed 3 weeks DAPT and continues on Plavix without bleeding or bruising.  Continues on atorvastatin 40 mg daily without myalgias.  Blood pressures 132/70.  Underlying anxiety slightly worsened post stroke as well as complaints of occasional dizziness and nausea especially with increased pain or after excessive exertion.  Currently on BuSpar 5 mg twice daily and Xanax as needed.  No further  concerns at this time.     ROS:   14 system review of systems performed and negative with exception of gait impairment and anxiety  PMH:  Past Medical History:  Diagnosis Date  . Allergy   . Chronic interstitial cystitis   . Hypertension     PSH:  Past Surgical History:  Procedure Laterality Date  . ABDOMINAL HYSTERECTOMY    . INTRAMEDULLARY (IM) NAIL INTERTROCHANTERIC  05/04/2016  . INTRAMEDULLARY (IM) NAIL INTERTROCHANTERIC Left 05/04/2016   Procedure: INTRAMEDULLARY (IM) NAIL INTERTROCHANTRIC;  Surgeon: Mcarthur Rossetti, MD;  Location: [facility];  Service: Orthopedics;  Laterality: Left;  . tonsillectomyy      Social History:  Social History  Socioeconomic History  . Marital status: Single    Spouse name: Not on file  . Number of children: Not on file  . Years of education: Not on file  . Highest education level: Not on file  Occupational History  . Not on file  Tobacco Use  . Smoking status: Never Smoker  . Smokeless tobacco: Never Used  Substance and Sexual Activity  . Alcohol use: No  . Drug use: No  . Sexual activity: Not Currently    Birth control/protection: Post-menopausal  Other  Topics Concern  . Not on file  Social History Narrative  . Not on file   Social Determinants of Health   Financial Resource Strain:   . Difficulty of Paying Living Expenses:   Food Insecurity:   . Worried About Charity fundraiser in the Last Year:   . Arboriculturist in the Last Year:   Transportation Needs:   . Film/video editor (Medical):   [name] Lack of Transportation (Non-Medical):   Physical Activity:   . Days of Exercise per Week:   . Minutes of Exercise per Session:   Stress:   . Feeling of Stress :   Social Connections:   . Frequency of Communication with Friends and Family:   . Frequency of Social Gatherings with Friends and Family:   . Attends Religious Services:   . Active Member of Clubs or Organizations:   . Attends Archivist Meetings:   [name] Marital Status:   Intimate Partner Violence:   . Fear of Current or Ex-Partner:   . Emotionally Abused:   [name] Physically Abused:   . Sexually Abused:     Family History:  Family History  Problem Relation Age of Onset  . Heart disease Mother   . Heart disease Father   . Anxiety disorder Sister   . Heart disease Brother   . Anxiety disorder Brother     Medications:   Current Outpatient Medications on File Prior to Visit  Medication Sig Dispense Refill  . acetaminophen (TYLENOL) 325 MG tablet Take 2 tablets (650 mg total) by mouth every 6 (six) hours as needed (Pain, Hadache or Fever >/= 101).    [name] ALPRAZolam (XANAX) 0.25 MG tablet Take 0.5 tablets (0.125 mg total) by mouth at bedtime as needed for anxiety. 30 tablet 0  . amLODipine (NORVASC) 2.5 MG tablet Take 1 tablet (2.5 mg total) by mouth daily. 30 tablet 0  . atorvastatin (LIPITOR) 40 MG tablet Take 1 tablet (40 mg total) by mouth daily at 6 PM. 30 tablet 0  . busPIRone (BUSPAR) 5 MG tablet Take 1 tablet (5 mg total) by mouth 2 (two) times daily. 60 tablet 0  . Calcium Carb-Cholecalciferol (CALCIUM-VITAMIN D) 500-200 MG-UNIT tablet Take 1 tablet by  mouth daily.    . carboxymethylcellulose (REFRESH PLUS) 0.5 % SOLN Place 1 drop into both eyes daily as needed (dry eyes). 30 mL 3  . clopidogrel (PLAVIX) 75 MG tablet Take 1 tablet (75 mg total) by mouth daily. 30 tablet 1  . Co-Enzyme Q-10 30 MG CAPS Take 1 capsule (30 mg total) by mouth daily. 30 capsule 5  . diclofenac Sodium (VOLTAREN) 1 % GEL Apply 2 g topically 4 (four) times daily. 2 g 1  . flavoxATE (URISPAS) 100 MG tablet Take 1 tablet (100 mg total) by mouth 2 (two) times daily as needed for bladder spasms. 60 tablet 0  . irbesartan (AVAPRO) 150 MG tablet Take 1 tablet (150 mg  total) by mouth daily. 30 tablet 0  . Magnesium 100 MG TABS Take 1 tablet (100 mg total) by mouth daily. (Patient taking differently: Take 300 mg by mouth daily. ) 30 tablet 5  . Misc Natural Products (CYSTEX URINARY HEALTH) LIQD Take 1 tablet by mouth in the morning and at bedtime. 30 mL 5  . oxybutynin (DITROPAN) 5 MG tablet Take 1 tablet (5 mg total) by mouth at bedtime. 30 tablet 0  . Probiotic Product (PROBIOTIC DAILY PO) Take by mouth daily.    . QC CO Q-10 100 MG capsule TAKE 1 CAPSULE BY MOUTH EACH DAY    . rOPINIRole (REQUIP) 0.25 MG tablet Take 1 tablet (0.25 mg total) by mouth at bedtime. 30 tablet 0  . Vitamin D, Cholecalciferol, 50 MCG (2000 UT) CAPS Take by mouth in the morning and at bedtime.     No current facility-administered medications on file prior to visit.    Allergies:   Allergies  Allergen Reactions  . Ciprofloxacin     Muscle weakness  . Zoloft [Sertraline] Nausea And Vomiting      OBJECTIVE:  Physical Exam  Vitals:   06/03/19 1307  BP: 132/70  Pulse: 60  Temp: 97.6 F (36.4 C)  SpO2: 99%  Weight: 98 lb 3.2 oz (44.5 kg)  Height: 5\' 4"  (1.626 m)   Body mass index is 16.86 kg/m. No exam data present  Depression screen [facility] 2/9 06/03/2019  Decreased Interest 2  Down, Depressed, Hopeless 1  PHQ - 2 Score 3  Altered sleeping 3  Tired, decreased energy 3  Change  in appetite 0  Feeling bad or failure about yourself  3  Trouble concentrating 1  Moving slowly or fidgety/restless 1  Suicidal thoughts 0  PHQ-9 Score 14  Difficult doing work/chores Somewhat difficult    GAD 7 : Generalized Anxiety Score 06/03/2019  Nervous, Anxious, on Edge 2  Control/stop worrying 2  Worry too much - different things 2  Trouble relaxing 3  Restless 2  Easily annoyed or irritable 2  Afraid - awful might happen 1  Total GAD 7 Score 14      General: Frail pleasant elderly Caucasian female, seated, in no evident distress Head: head normocephalic and atraumatic.   Neck: supple with no carotid or supraclavicular bruits Cardiovascular: regular rate and rhythm, no murmurs Musculoskeletal: no deformity Skin:  no rash/petichiae Vascular:  Normal pulses all extremities   Neurologic Exam Mental Status: Awake and fully alert.   Normal speech and language.  Oriented to place and time. Recent and remote memory intact. Attention span, concentration and fund of knowledge appropriate. Mood and affect appropriate.  Cranial Nerves: Fundoscopic exam reveals sharp disc margins. Pupils equal, briskly reactive to light. Extraocular movements full without nystagmus. Visual fields full to confrontation. Hearing intact. Facial sensation intact. Face, tongue, palate moves normally and symmetrically.  Motor: Normal bulk and tone. Normal strength in all tested extremity muscles. Sensory.: intact to touch , pinprick , position and vibratory sensation.  Coordination: Rapid alternating movements normal in all extremities. Finger-to-nose and heel-to-shin evidence of left-sided dysmetria. Gait and Station: Deferred as wheelchair not present during visit Reflexes: 1+ and symmetric. Toes downgoing.     NIHSS  2 Modified Rankin  3      ASSESSMENT: Brooke Collier is a 84 y.o. year old female presented with bilateral lower extremity weakness and heaviness and hypertensive urgency on  04/14/2019 with stroke work-up revealing bilateral thalamic lacunar infarcts secondary to small  vessel disease. Vascular risk factors include HTN, HLD, generalized arterial estasia with mild arthrosclerosis in head and neck and calcified plaque 50% stenosis left ICA siphon.  Residual deficits of left-sided dysmetria and gait impairment with ongoing improvement     PLAN:  1. Bilateral thalamic stroke:  -Gait impairment: Continue to participate in home health PT and advised to call office once completed if interested in pursuing outpatient therapy which would likely be beneficial -Continue clopidogrel 75 mg daily  and atorvastatin 40 mg daily for secondary stroke prevention. Maintain strict control of hypertension with blood pressure goal below 130/90, diabetes with hemoglobin A1c goal below 6.5% and cholesterol with LDL cholesterol (bad cholesterol) goal below 70 mg/dL.  I also advised the patient to eat a healthy diet with plenty of whole grains, cereals, fruits and vegetables, exercise regularly with at least 30 minutes of continuous activity daily and maintain ideal body weight. 2. HTN: Stable.  Continue to follow with PCP for monitoring management 3. HLD: Continuation of atorvastatin and follow-up with PCP for ongoing prescribing, monitoring and management 4. Anxiety/depression: Worsened post stroke and recommend initiating Lexapro 5 mg daily with hopes of decreasing need of BuSpar and Xanax.  Advised to follow-up with PCP for further monitoring and management     Follow up in 3 months or call earlier if needed   I spent 45 minutes of face-to-face and non-face-to-face time with patient.  This included previsit chart review, lab review, study review, order entry, electronic health record documentation, patient education regarding recent stroke, residual deficits, importance of managing stroke risk factors and answered all questions to patient satisfaction     Frann Rider, [facility]   [facility] [address] [place], [place] [address]  Phone [phone] Fax [phone] Note: This document was prepared with digital dictation and possible smart phrase technology. Any transcriptional errors that result from this process are unintentional.

## 2019-06-10 ENCOUNTER — Telehealth: Payer: Self-pay | Admitting: *Deleted

## 2019-06-10 NOTE — Telephone Encounter (Signed)
[name] called because she is having some issues with nausea and loose bms.  She does not describe it as diarrhea, but not what would be considered normal as well. It happens a couple of times per day, and she cannot tie it to a time specific to a medication she has taken.  I discussed in depth with her the issues with her medications are ones that are typically prescribed and followed by primary care once out of Inpatient rehab, and we do not "typically" continue to prescribe and follow these medications unless there is an issue with not having a primary care. In such cases, we may follow medications as a courtesy until care can be assumed. In her case, she has had a video follow up visit with Brooke Collier, her primary care on 06/02/19. I have asked that they reach out to her office about this issue since Brooke Collier is out of the office for 2 weeks on vacation. In the meantime, I suggested she might want to take her probiotic that she was taking before her stroke. She is also taking magnesium "to calm her" but it can cause loose bms as well. They were appreciative of all the information and explanations since they have been confused over what they should do.  Her son mentioned Brooke Collier being her doctor inpatient, and I will forward to him for advice or recommendation, since Brooke Collier is not available.

## 2019-06-10 NOTE — Telephone Encounter (Signed)
I don't have any other suggestions but this is not a stroke related issue.  I agree with PCP follow up.  Stop laxatives including Magnesium .

## 2019-06-10 NOTE — Telephone Encounter (Signed)
Notified. 

## 2019-06-14 NOTE — Progress Notes (Signed)
I agree with the above plan 

## 2019-06-17 ENCOUNTER — Other Ambulatory Visit: Payer: Self-pay

## 2019-06-17 ENCOUNTER — Encounter: Payer: Medicare HMO | Attending: Physical Medicine & Rehabilitation | Admitting: Physical Medicine & Rehabilitation

## 2019-06-17 ENCOUNTER — Encounter: Payer: Self-pay | Admitting: Physical Medicine & Rehabilitation

## 2019-06-17 VITALS — BP 153/87 | HR 70 | Temp 97.5°F | Ht 64.0 in | Wt 100.6 lb

## 2019-06-17 DIAGNOSIS — I69393 Ataxia following cerebral infarction: Secondary | ICD-10-CM | POA: Insufficient documentation

## 2019-06-17 DIAGNOSIS — R269 Unspecified abnormalities of gait and mobility: Secondary | ICD-10-CM | POA: Insufficient documentation

## 2019-06-17 DIAGNOSIS — I6381 Other cerebral infarction due to occlusion or stenosis of small artery: Secondary | ICD-10-CM | POA: Insufficient documentation

## 2019-06-17 DIAGNOSIS — I69398 Other sequelae of cerebral infarction: Secondary | ICD-10-CM | POA: Insufficient documentation

## 2019-06-17 DIAGNOSIS — N3030 Trigonitis without hematuria: Secondary | ICD-10-CM | POA: Diagnosis present

## 2019-06-17 NOTE — Patient Instructions (Addendum)
  Try stopping ditropan and or fluvoxate for a week to  See if nausea improves   Lexapro can cause nausea but this often clears after a month

## 2019-06-17 NOTE — Progress Notes (Signed)
Subjective:    Patient ID: Brooke Collier, female    DOB: 01-May-1935, 84 y.o.   MRN: [id number] 83 y.o.right-handed femalewith history of hypertension, anxiety, chronic interstitial cystitis. Patient lives alone independent prior to admission to level home. Presented 04/14/2019 lower extremity weakness. Blood pressure 170/79. Admission chemistries unremarkable, SARS coronavirus negative. Cranial CT scan unremarkable for acute intracranial process. Patient did not receive TPA. MRI showed acute on chronic small vessel ischemia in the thalami. An 8 mm right lateral thalamic internal capsule lacunar noted along with evidence of punctate contralateral left ventral thalamic infarction. CT angiogram of head and neck showed generalized Ectasiawith mild atherosclerotic changes with 50% left supraclinoid ICA stenosis only. Echocardiogram with ejection fraction of 65% without emboli. Aspirin and Plavix added for CVA prophylaxis x3 weeks then Plavix alone.   Admit date:04/19/2019 Discharge date:04/29/2019 HPI  Patient is here with her son.  He has primary concern of nausea.  He does not recall when this started.  She did have some of this problem at the hospital.  Her primary care physician has evaluated this condition, BuSpar dose was reduced.  The patient was placed on Lexapro.  Has been tried on Zofran on as-needed basis which the patient does not feel was very helpful. The patient's son would like me to identify certain medications that were started in the hospital that may be tension causing some nausea She has been alternating her place of residence between her place in [place] supervised by her daughter and staying with her son in [place]. Her son from [place] is with her today HHPT and OT has one more visit  Somebalance issues with shower transfers, otherwise mod I dressing  No falls at home Amb with walker Mod I Did not need walker   prior to CVA  The patient  continues to have some problems with bladder frequency.  She was started on flavoxate as well as nocturnal Ditropan to help with frequency  Patient also feels like she has alternating diarrhea and constipation  Dizziness improving   Pain Inventory Average Pain 5 Pain Right Now 5 My pain is aching  In the last 24 hours, has pain interfered with the following? General activity 6 Relation with others 0 Enjoyment of life 6 What TIME of day is your pain at its worst? evening Sleep (in general) Poor  Pain is worse with: walking, bending and some activites Pain improves with: rest, heat/ice and medication Relief from Meds: 5  Mobility use a walker ability to climb steps?  no do you drive?  no  Function retired I need assistance with the following:  meal prep, household duties and shopping  Neuro/Psych bladder control problems weakness spasms dizziness anxiety  Prior Studies Any changes since last visit?  no  Physicians involved in your care Any changes since last visit?  no   Family History  Problem Relation Age of Onset  . Heart disease Mother   . Heart disease Father   . Anxiety disorder Sister   . Heart disease Brother   . Anxiety disorder Brother    Social History   Socioeconomic History  . Marital status: Single    Spouse name: Not on file  . Number of children: Not on file  . Years of education: Not on file  . Highest education level: Not on file  Occupational History  . Not on file  Tobacco Use  . Smoking status: Never Smoker  . Smokeless tobacco: Never Used  Substance and Sexual  Activity  . Alcohol use: No  . Drug use: No  . Sexual activity: Not Currently    Birth control/protection: Post-menopausal  Other Topics Concern  . Not on file  Social History Narrative  . Not on file   Social Determinants of Health   Financial Resource Strain:   . Difficulty of Paying Living Expenses:   Food Insecurity:   . Worried About Charity fundraiser in  the Last Year:   . Arboriculturist in the Last Year:   Transportation Needs:   . Film/video editor (Medical):   [name] Lack of Transportation (Non-Medical):   Physical Activity:   . Days of Exercise per Week:   . Minutes of Exercise per Session:   Stress:   . Feeling of Stress :   Social Connections:   . Frequency of Communication with Friends and Family:   . Frequency of Social Gatherings with Friends and Family:   . Attends Religious Services:   . Active Member of Clubs or Organizations:   . Attends Archivist Meetings:   [name] Marital Status:    Past Surgical History:  Procedure Laterality Date  . ABDOMINAL HYSTERECTOMY    . INTRAMEDULLARY (IM) NAIL INTERTROCHANTERIC  05/04/2016  . INTRAMEDULLARY (IM) NAIL INTERTROCHANTERIC Left 05/04/2016   Procedure: INTRAMEDULLARY (IM) NAIL INTERTROCHANTRIC;  Surgeon: Mcarthur Rossetti, MD;  Location: [facility];  Service: Orthopedics;  Laterality: Left;  . tonsillectomyy     Past Medical History:  Diagnosis Date  . Allergy   . Chronic interstitial cystitis   . Hypertension    BP (!) 153/87   Pulse 70   Temp (!) 97.5 F (36.4 C)   Ht 5\' 4"  (1.626 m)   Wt 100 lb 9.6 oz (45.6 kg)   SpO2 97%   BMI 17.27 kg/m   Opioid Risk Score:   Fall Risk Score:  `1  Depression screen PHQ 2/9  Depression screen [facility] 2/9 06/17/2019 06/03/2019  Decreased Interest 1 2  Down, Depressed, Hopeless 1 1  PHQ - 2 Score 2 3  Altered sleeping - 3  Tired, decreased energy - 3  Change in appetite - 0  Feeling bad or failure about yourself  - 3  Trouble concentrating - 1  Moving slowly or fidgety/restless - 1  Suicidal thoughts - 0  PHQ-9 Score - 14  Difficult doing work/chores - Somewhat difficult    Review of Systems  Constitutional: Positive for unexpected weight change.       Wt loss  HENT: Negative.   Eyes: Negative.   Respiratory: Negative.   Cardiovascular: Positive for leg swelling.  Gastrointestinal: Positive for abdominal pain,  constipation, diarrhea and nausea.  Endocrine: Negative.   Genitourinary:       Bladder control  Musculoskeletal:       Spasms  Skin: Negative.   Allergic/Immunologic: Negative.   Neurological: Positive for dizziness and weakness.  Hematological: Bruises/bleeds easily.       Plavix  Psychiatric/Behavioral: The patient is nervous/anxious.   All other systems reviewed and are negative.      Objective:   Physical Exam  Thin elderly female in no acute distress Abdomen positive bowel sounds soft nontender palpation mild abdominal distention Mild dysmetria left side with finger-nose-finger testing normal on the right side Motor strength 5/5 bilateral deltoid bicep tricep grip hip flexors knee extensors dorsiflex Sit to stand use and supervision she can ambulate with hand-held assist x1 without evidence of toe drag or knee instability.  Mood and affect are anxious Speech without dysarthria or aphasia Visual fields are intact Intact light touch sensation    Assessment & Plan:   #1.)  Left thalamic lacunar infarcts primarily with left-sided dysmetria and truncal ataxia.  Overall has had a good recovery but still requires walker to ambulate.  She did not require a walker prior to her stroke.  Will refer to outpatient PT.  2.  Nausea, she had some problems with this in the hospital although the patient is complaining more of this in the last few weeks. We discussed that certain medications such as her clopidogrel cannot be stopped. We discussed some of her symptomatic medications such as flavoxate and Ditropan can be stopped.  I recommend stopping ditropan first and and if this is not helpful stop the flavoxate.  She will have to see if her urinary frequency increases  Return to clinic in 6 weeks

## 2019-06-19 ENCOUNTER — Other Ambulatory Visit: Payer: Self-pay | Admitting: Physical Medicine and Rehabilitation

## 2019-06-22 DIAGNOSIS — G2581 Restless legs syndrome: Secondary | ICD-10-CM | POA: Insufficient documentation

## 2019-07-02 ENCOUNTER — Telehealth: Payer: Self-pay | Admitting: *Deleted

## 2019-07-02 NOTE — Telephone Encounter (Signed)
Received a call from the patient's son noting that his mother is exhibiting barazare behaviors at times and is demonstrating paranoia. He and his sister have been sharing the caregiver role but it is "getting to be a little more than they can manage". Reported he did not share this information with Dr. Letta Pate at an appointment a few weeks ago, as he did not think it was important; however the comments are noted more frequent than in the beginning. Referred the son back to Dr. Letta Pate and to the patient's primary care physician. Also gave information on resources available for the home including private duty care agencies and option for ALF facilities. Stated an understanding of the information reviewed and noted he will contact the physician's offices.

## 2019-07-02 NOTE — Telephone Encounter (Signed)
This is not unusual after a stroke.  The patient is on an antidepressant medication. Additionally the patient can follow-up with our neuropsychologist Dr.Zussman or [place]. If she is still in the [facility] area please ask for a referral from her primary care provider to a psychologist or psychiatrist in that area

## 2019-07-02 NOTE — Telephone Encounter (Signed)
Mr Brooke Collier called and he is very concerned about his mother's mental health.  She is depressed and is saying things that is very worrisome to him. He is not sure if this is par for the stroke or what it is.  Please advise.

## 2019-07-05 NOTE — Telephone Encounter (Signed)
Message left for Brooke Collier with Dr Letta Pate reply.

## 2019-07-06 ENCOUNTER — Telehealth: Payer: Self-pay

## 2019-07-06 DIAGNOSIS — G4700 Insomnia, unspecified: Secondary | ICD-10-CM | POA: Insufficient documentation

## 2019-07-06 NOTE — Telephone Encounter (Signed)
Ptn called and ask for Zella Ball or Golda Acre - wants someone to call and talk to her about stroke and how to cope - family disagrees with whether she can live alone [phone]

## 2019-07-07 NOTE — Telephone Encounter (Addendum)
I spoke with Brooke Collier.  Her family is not going to be staying with her like they did before. I told her that hse has an appt with Dr Marge Duncans next Wednesday 07/14/19 @ 3:00, and Dr Letta Pate 08/03/19 @ 1:15. She has been having some back pain and says she knows she needs to exercise but it has been bothering her.  She was glad to know she is seeind Dr Marge Duncans next week and I told her if she needs an earlier appt with Dr Letta Pate to please call us. She agreed.

## 2019-07-14 ENCOUNTER — Encounter: Payer: Medicare HMO | Attending: Physical Medicine & Rehabilitation | Admitting: Psychology

## 2019-07-14 ENCOUNTER — Other Ambulatory Visit: Payer: Self-pay

## 2019-07-14 DIAGNOSIS — I69398 Other sequelae of cerebral infarction: Secondary | ICD-10-CM | POA: Diagnosis present

## 2019-07-14 DIAGNOSIS — F068 Other specified mental disorders due to known physiological condition: Secondary | ICD-10-CM | POA: Diagnosis not present

## 2019-07-14 DIAGNOSIS — N3030 Trigonitis without hematuria: Secondary | ICD-10-CM | POA: Insufficient documentation

## 2019-07-14 DIAGNOSIS — I639 Cerebral infarction, unspecified: Secondary | ICD-10-CM | POA: Diagnosis not present

## 2019-07-14 DIAGNOSIS — I69393 Ataxia following cerebral infarction: Secondary | ICD-10-CM | POA: Diagnosis not present

## 2019-07-14 DIAGNOSIS — R269 Unspecified abnormalities of gait and mobility: Secondary | ICD-10-CM | POA: Diagnosis present

## 2019-07-14 DIAGNOSIS — F0631 Mood disorder due to known physiological condition with depressive features: Secondary | ICD-10-CM

## 2019-07-14 DIAGNOSIS — I6381 Other cerebral infarction due to occlusion or stenosis of small artery: Secondary | ICD-10-CM | POA: Diagnosis not present

## 2019-07-14 NOTE — Progress Notes (Addendum)
INITIAL PSYCHOLOGICAL SCREEN/PLAN   Name:    CHRISTINIA POLICASTRO   Date of Birth:   [birth year] Date of consult:  07/14/19 Start Time:    3:00 PM End Time:    4:27 PM    Background Information:  Reason for Referral:    Mrs. Tomei is an 84 year old, widowed, Caucasian American female who was referred to therapy by Dr. Letta Pate due to reported symptoms of depression and anxiety, chronic pain, and functional difficulties after CVA on 04/14/19.      History of Presenting Problem:   The patient recalled experiencing mood changes after her husband died in 06-03-11, but reported feeling more depressed after her CVA on 04/14/19. The patient acknowledged that her recent stroke has caused several complications that force her to depend on others (e.g., children, family, neighbors/friends) whereas before she was able to live independently without much issue; she has had difficulty adjusting to the resulting changes in life over the past few months.    With regard to depression, the patient reported feelings of sadness, crying, difficulty concentrating, fatigue, feelings of helplessness and worthlessness, loss of interest in pleasurable activities, and social isolation. She described that her symptoms have been fairly stable since their onset, and that there are no long periods of time where she does not feel sad. She stated that she often finds herself thinking "what's the use?" and that she has no one to interact with most days (e.g., other than her children). She is increasingly irritable and agitated as well as short tempered; this has led to verbal outbursts. She has less patience. She is also stern with her children, especially her daughter.  There is longstanding conflict between patient's sister (and her family) and daughter; this causes significant distress and strains the relationship between patient, daughter, and even patient's son who lives in [place].     Her daughter has been responsible for bringing  groceries and prepared meals from whole foods to her home and will cook on occasion. However, she is a full-time Public relations account executive and has a family with children of her own. There have been times recently where food has been brought over but not eaten due to patient's strict preferences and fears.  Food is a major area of concern and she conforms to a strict diet. She has some dietary restrictions due to certain medical conditions but takes this to the extreme and is rigid. She often refuses to eat foods with oxalates and fears havening another kidney stone. At the same time, she fears going hungry and will often panic if she believes there is no food she can eat.   Socially, Mrs. Viona Gilmore reported that she is more withdrawn but fears being alon She is not enjoying previously enjoyed activities. The patient made statements suggesting that she is extremely cautious in accepting assistance from others, as she fears letting people she does not know into her home, including repairmen and other maintenance personnel. When asked to describe this, the patient stated that she is worried that others may want to harm her. She is suspicious and irritable with others.  The patient also reported chronic pain and mobility issues that cause distress and limit adaptive functioning. She admitted to problems keeping up with routine responsibilities such as cooking and household chores. She has a life alert device at home but has not programmed it and expressed being somewhat resistant to using it.   The patient reported that she attributes part of her symptoms  to loneliness, problems taking care of herself, and fear of being by herself or forgotten.   She denied previous history of counseling or therapy.   Sources of Information Medical Records available from [facility] records were reviewed. Mrs. TOCARRA SPIKES and her daughter were interviewed.   Current Signs/Symptoms Mood: Depressed and  anxious  Sleep: Good  Appetite: Good but picky eater w/ dietary restrictions Energy level: Low  Suicidal ideas: Passive ideation at times but credibly denied plan and intent.  Homicidal ideas: None Anxiety: Yes, currently present  Impulse Control: No significant problems  Hallucinations: denied Delusions: None reported Other: Fear of food scarcity Substance abuse: Denied, none reported  Risk of Suicide/Violence Low: BRENDIA SOLER admitted to passive suicidal thoughts on occasion but credibly denied plan or intent. She has no previous history of suicide attempt. She denied thoughts of harming others and has no history of violence.    Medical History:  Past Medical History:  Diagnosis Date  . Allergy   . Chronic interstitial cystitis   . Hypertension     Current medications:  Outpatient Encounter Medications as of 07/14/2019  Medication Sig  . acetaminophen (TYLENOL) 325 MG tablet Take 2 tablets (650 mg total) by mouth every 6 (six) hours as needed (Pain, Hadache or Fever >/= 101).  [name] ALPRAZolam (XANAX) 0.25 MG tablet Take 0.5 tablets (0.125 mg total) by mouth at bedtime as needed for anxiety.  [name] amLODipine (NORVASC) 2.5 MG tablet Take 1 tablet (2.5 mg total) by mouth daily.  [name] atorvastatin (LIPITOR) 40 MG tablet TAKE 1 TABLET BY MOUTH EACH DAY AT 6PM FOR CHOLESTEROL  . busPIRone (BUSPAR) 5 MG tablet Take 1 tablet (5 mg total) by mouth 2 (two) times daily.  . Calcium Carb-Cholecalciferol (CALCIUM-VITAMIN D) 500-200 MG-UNIT tablet Take 1 tablet by mouth daily.  . carboxymethylcellulose (REFRESH PLUS) 0.5 % SOLN Place 1 drop into both eyes daily as needed (dry eyes).  . clopidogrel (PLAVIX) 75 MG tablet Take 1 tablet (75 mg total) by mouth daily.  [name] Co-Enzyme Q-10 30 MG CAPS Take 1 capsule (30 mg total) by mouth daily.  . diclofenac Sodium (VOLTAREN) 1 % GEL Apply 2 g topically 4 (four) times daily.  [name] escitalopram (LEXAPRO) 5 MG tablet Take 1 tablet (5 mg total) by mouth daily.    . flavoxATE (URISPAS) 100 MG tablet Take 1 tablet (100 mg total) by mouth 2 (two) times daily as needed for bladder spasms.  . irbesartan (AVAPRO) 150 MG tablet TAKE 1 TABLET BY MOUTH EACH DAY  . Magnesium 100 MG TABS Take 1 tablet (100 mg total) by mouth daily. (Patient taking differently: Take 300 mg by mouth daily. )  . Misc Natural Products (CYSTEX URINARY HEALTH) LIQD Take 1 tablet by mouth in the morning and at bedtime. (Patient not taking: Reported on 06/17/2019)  . ondansetron (ZOFRAN) 4 MG tablet Take 4 mg by mouth every 8 (eight) hours as needed.  [name] oxybutynin (DITROPAN) 5 MG tablet Take 1 tablet (5 mg total) by mouth at bedtime.  . Probiotic Product (PROBIOTIC DAILY PO) Take by mouth daily.  . QC CO Q-10 100 MG capsule TAKE 1 CAPSULE BY MOUTH EACH DAY  . rOPINIRole (REQUIP) 0.25 MG tablet Take 1 tablet (0.25 mg total) by mouth at bedtime.  . Vitamin D, Cholecalciferol, 50 MCG (2000 UT) CAPS Take by mouth in the morning and at bedtime.   No facility-administered encounter medications on file as of 07/14/2019.  Current Examination:  Mental Status & Behavioral Observations The patient arrived at her appointment on time and was accompanied by her daughter, who also served as an informant. She was dressed and groomed casually. She was alert and fully oriented. Her spontaneous speech was clear and prosodic with normal rate, volume, and tone. Mild word finding difficulties were noted. Receptive language was intact. She used a wheelchair for ambulating but typically uses a walker. Energy level was low. Mood was irritable and depressed. She appeared to be in some pain as well. Affect was tense. Thought processes were logical and goal directed, and thought content was within normal limits. Insight and judgment are limited and fair, respectively    Assessment/Plan   Patient will participate in individual therapy to develop a realistic plan for living at home safely,address mood changes, chronic  pain, and functional limitations that current causes distress and relational strain.   Plan is to collaborate with patient and her children and come up with a viable plan for patient to remain living at her home safely and as comfortable as possible. This will include looking into different meal plan and home help options. We will also work together to come up with alternative or assisted living arrangements in the event the patient is not able to live at home.     Plan is to call patient's son by next therapy session to collect additional information to inform treatment plan.     Plan is to consult with Dr. Delynn Flavin and ask him to move P.T. referral from [place] to [facility].   Next therapy/information gathering session is scheduled for 07/20/19 at 11:00      While Naseem W Keville is not seen to be at risk to harm self or others at this time, ZAKAYLA YAUCH agreed to call 911 or go to the nearest emergency room should he/she develop suicidal intent.      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     Ilean Skill, Psy.D. Neuropsychologist                                                                  Neuropsychologist

## 2019-07-15 NOTE — Addendum Note (Signed)
Addended by: Charlett Blake on: 07/15/2019 12:32 PM   Modules accepted: Orders

## 2019-07-20 ENCOUNTER — Other Ambulatory Visit: Payer: Self-pay

## 2019-07-20 ENCOUNTER — Encounter: Payer: Medicare HMO | Admitting: Psychology

## 2019-07-20 DIAGNOSIS — F0631 Mood disorder due to known physiological condition with depressive features: Secondary | ICD-10-CM | POA: Diagnosis not present

## 2019-07-20 DIAGNOSIS — I6381 Other cerebral infarction due to occlusion or stenosis of small artery: Secondary | ICD-10-CM | POA: Diagnosis not present

## 2019-07-20 DIAGNOSIS — F068 Other specified mental disorders due to known physiological condition: Secondary | ICD-10-CM | POA: Diagnosis not present

## 2019-07-20 DIAGNOSIS — I69393 Ataxia following cerebral infarction: Secondary | ICD-10-CM | POA: Diagnosis not present

## 2019-07-20 DIAGNOSIS — I639 Cerebral infarction, unspecified: Secondary | ICD-10-CM | POA: Diagnosis not present

## 2019-07-21 ENCOUNTER — Encounter: Payer: Self-pay | Admitting: Psychology

## 2019-07-25 ENCOUNTER — Encounter: Payer: Self-pay | Admitting: Psychology

## 2019-07-25 NOTE — Progress Notes (Addendum)
Patient ID: Brooke Collier, female   DOB: 1935-06-13, 84 y.o.   MRN: [id number]  Date: 07/20/19  Psychology Progress Note  Therapy: Session 2  Patient was on time for her scheduled in-person therapy appointment (14:00) and was accompanied by her daughter and son (visiting from [place]). The therapy session lasted 90 minutes.   Goal of Session:  The goal of this session was to continue establishing a therapeutic relationship with patient, build rapport, assess current pain and mood, review previous week, and gather more information to formulate a viable plan of care due to functional decline post-stroke.   Content of Session: The session began by introducing myself to patients son and briefly summarizing the content of the previous session. Goals for the current therapy session were also discussed. I inquired about patients current mood and level of pain. Patient described feeling a little better today and reported less pain (1/10) than the week before (5/10). She described her appetite as good and had no complaints about the meals her daughter purchased from whole foods last week. Per her report, she ate oatmeal in mornings and enjoyed her other meals during the day (e.g., [place] burger, peas, rice, etc.). She was proud to say that she made the basmati rice herself. She endorsed drinking around [facility] of water per day and appeared open to drinking more.   A common theme that ran throughout the therapy session was the patients fear of being alone. At the same time, she is generally fearful of unfamiliar people and apprehensive about accepting help from someone she does not know at home.   Most of the session was focused on identifying patients true caregiving needs and discussing any resistance towards accepting help from other sources of support. Patients children were able to express feelings about their respective caregiving roles and talk openly about some of the challenges they foresee  in the future, given limited capacity to provide full time care. We discussed the idea of utilizing a few hours of in-home care services per week to alleviate some of the caregiving stress from patients children as well as a meal plan that can accommodate her dietary restrictions and specific preferences. She has certain dietary restrictions but tends to take this to the extreme and is underweight. She refuses to eat acidic foods, wheat, or citrus, and avoids oxalates whenever possible due to fear of another kidney stone. She evidences poor reasoning surrounding proper nutrition and dietary needs. She becomes defensive whenever this topic is brought up by her children, which has contributed to conflict.   Her daughter (Brooke Collier) is currently responsible for ordering meals from whole foods and helping around the house (e.g., clean dishes, laundry, etc.) and reportedly spends around 4-5 hours per week performing these activities. She currently spends around $60-$80 per week on meals. The patient is reportedly able to obtain fresh vegetables and poultry from a neighboring farm on occasion.  Patient responds well to encouragement and praise, as well as conversation about her interests (e.g., religion, bible, teaching, etc.). Focusing on her strengths and interests while supporting her in weaker areas may continue to motivate her to engage the skills she has and increase her activity level throughout the week. Providing the patient with reading and visual material that align with her interests may provide her with greater mental stimulation and motivation to engage in positive behavior.   Behavioral Observations:  Patient was polite, attentive, and expressed appreciation for being offered support and treatment. Thought process was logical  and goal directed. Thought content was perseverative at times and she appeared to have some trouble recalling recent events. She did not express any overt delusional or grandiose  ideation. She manifests adequate appetite and sleep. Mood has been irritable and depressed but mostly stable.   Assessment/Plan: Plan is to help connect patients children with local [facility] to help manage caregiving duties and allow patient to remain living at home safely and comfortably. We will also discuss long term care options to prepare for any future decline or increased dependency.  Next therapy session: Scheduled for 07/27/19 at 11:AM Will review weekly activities, initiate pleasant events schedule, continue monitoring mood and pain,  Plan to provide patients children with a list of home health agencies for managing basic caregiving duties and companion support. Plan is also to begin working with patient individually on coping strategies to better manage mood and pain. Patient and I will identify long-and short-term goals and collaborate on treatment plan.     ___________________________ Joelyn Oms, Psy.D. Clinical Psychologist  Neuropsychologist

## 2019-07-27 ENCOUNTER — Other Ambulatory Visit: Payer: Self-pay

## 2019-07-27 ENCOUNTER — Encounter: Payer: Medicare HMO | Admitting: Psychology

## 2019-07-27 DIAGNOSIS — F0631 Mood disorder due to known physiological condition with depressive features: Secondary | ICD-10-CM

## 2019-07-27 DIAGNOSIS — I6381 Other cerebral infarction due to occlusion or stenosis of small artery: Secondary | ICD-10-CM

## 2019-07-27 DIAGNOSIS — I639 Cerebral infarction, unspecified: Secondary | ICD-10-CM

## 2019-07-27 DIAGNOSIS — I69393 Ataxia following cerebral infarction: Secondary | ICD-10-CM | POA: Diagnosis not present

## 2019-07-27 DIAGNOSIS — F068 Other specified mental disorders due to known physiological condition: Secondary | ICD-10-CM | POA: Diagnosis not present

## 2019-07-29 ENCOUNTER — Ambulatory Visit: Payer: Medicare HMO | Admitting: Physical Medicine & Rehabilitation

## 2019-07-30 ENCOUNTER — Encounter: Payer: Self-pay | Admitting: Psychology

## 2019-07-30 NOTE — Progress Notes (Deleted)
Subjective:    Patient ID: Brooke Collier is a 84 y.o. female.  Chief Complaint:   Objective:  Physical Exam Psychiatric:        Attention and Perception: Attention normal.        Mood and Affect: Mood is anxious and depressed. Affect is tearful.        Speech: Speech normal.        Behavior: Behavior is withdrawn. Behavior is cooperative.        Thought Content: Thought content normal.        Cognition and Memory: Cognition and memory normal.        Judgment: Judgment normal.     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

## 2019-08-02 NOTE — Progress Notes (Addendum)
Psychology Progress Note     Patient:   Brooke Collier   DOB:   [birth year]  MR Number:  [id number]   Location:  [facility] [place], [place] [id number] [facility] [place] [address]  Dept: [phone]  Date of Service:   07/27/19   Start Time:   11:00 AM End Time:   12:15 PM  Provider/Observer:  Joelyn Oms, Psy.D., Clinical Neuropsychologist    Billing Code/Service: [phone]  Reason for Service: Brooke Collier is an 84 year old, widowed, Caucasian American female who was referred to therapy by Dr. Letta Pate due to reported symptoms of depression and anxiety, chronic pain, and functional difficulties after CVA on 04/14/19    Goal of Session:   The goal of this session was to provide information to patient's son and daughter on home health agencies for managing basic caregiving duties and companion support, continue building rapport with patient, begin working with patient individually on coping strategies to better manage mood and pain, and identify long-and short-term goals and collaborate on treatment plan.    Content of Session: The patient was on time for her scheduled in-person therapy appointment (11:00) and was accompanied by her son (still visiting from [place]). Her son attended the session for around 10 minutes to inform me that his sister and mother (pt.) agreed on a preliminary plan of care involving her coming over on Tuesdays and Thursdays for around 3-4 hours at a time to bring groceries and help around the house. I provided him with resources regarding home health care, meal preparation, and insurance options (I.e., PACE program) to supplement this care as needed. Patient's son was also encouraged to visit [employer]'s website and an online profile for patient to obtain additional caregiver resources.       I inquired about patient's current mood and level of pain.  Patient endorsed feeling some pain and self-rated it as a 4 or 5/10. She stated that walking and moving around throughout the day helps along with taking Tylenol; she sometimes worries about running out of Tylenol. She continued to describe experiencing more pain in the evening and specifically noted having lower back pain from a recent strain. She mentioned feeling somewhat afraid at night and acknowledged that she tends to worry about a number of different things during this time, which also seems to make her pain and sleep problems worse. She admitted to worrying more about her medical health since having the stroke and also experiencing panic attacks on occasion; she spends a lot of time thinking about her blood pressure, and ways she can "be prepared" for the future (e.g., I'm going to be all alone --> I have to deal with this all by myself--> I won't be able to manage alone --> panic symptoms). She also described feeling confused and/or disoriented on occasion; all of these symptoms negatively impact sleep pattern (I.e., falling and staying asleep). She currently takes "sleeping pills" but expressed desire to stop; she has a fear of being overmedicated. She is able to manage her own medication independently (I.e., organizes pills into box) and reportedly keeps notes (e.g., name, time of consumption, etc.).     Patient's loneliness and fear of being alone continue to appear as major themes in therapy. Time was spent learning more about her interests (e.g., beach, sunbathing, volunteering, church, etc.), additional sources for emotional and social support, and daily routines, including sleep behaviors. She continues to  describe her appetite as "good" and had no complaints about the meals she ate during the previous week. In fact, she reportedly made all of her own meals and even made one for her son. I used praise and supportive statements to reinforce patient's health eating habits and motivation to make her  own meals.   I introduced patient to diaphragmatic breathing and provided her with visual material to reference at home. I encouraged patient to practice for around 5 minutes in session and then re-assessed her pain and mood. She admitted to feeling less pain and more relaxed compared to when she first came in. We reflected on this change and began to make an initial treatment plan.  She stated that one of her current goals is to go to bed earlier at night, specifically around 10:00-10:30pm.  I provided patient with printed material on sleep hygiene and reviewed the cognitive model. I used a visual diagram to show the interconnectivity between thought's, feelings, and behavior as a segway to learning the "panic cycle"  I assigned homework at the end of the sessions and answered any questions she had.     Behavioral Observations:   Patient was polite, attentive, and appreciative. Thought process was logical and goal directed. Thought content was unremarkable. She endorsed having passive suicidal thoughts from time to time but credibly denied any plan or intent. Mood was irritable and depressed. Affect was tense.   Assessment/Plan:   Brooke Collier will continue participating in weekly or biweekly individual therapy sessions (62min 2-4 times per month) to treat symptoms of depression, anxiety, and disrupted sleep. Additional therapy sessions may be scheduled based on patient's progress towards treatment goals, severity of symptoms, and/or medical need. Next individual therapy session is scheduled for 08/12/19 at 11:00AM.     Treatment will utilize a cognitive-behavioral approach that incorporates several strategies including the following:    1. Sleep-hygiene education: Goal is to provide guidelines for improving sleep-related behaviors (e.g., avoid napping during the day, avoiding caffeine in the evenings, avoiding strenuous physical and mental activity just before bedtime)   2. Stimulus Control: Goal is  to strengthen bed and bedroom as cues for sleep and weaken them as cues for other activities.    3. Relaxation Training: Goal is to reduce anxiety and stress and promote mental and physical relaxation.    4. Cognitive Therapy: Goal: correct faulty attitudes and beliefs about sleep and counterproductive sleep strategies.    Other treatment goals that may be relevant for the client include increasing her pleasurable activities and social contact, decreasing worry, and assisting with healthy weight loss plan (e.g., diet, exercise, eating behavior, etc.).  Short-term Therapy Goal: In bed Sleeping by 12:00am        Patient's Self-reported Goal(s):  1. In bed asleep by 10:00 - 10:30pm most days of the week.   2. Reduce time spent worrying and feel less anxious during the day 3. Eliminate panic attacks  4. Stop taking sleep medication  5. Healthy weight gain 5. Increase level of physical activity    Home work Assignment:   Practice Diaphragmatic breathing at least once for 5 minutes during the next week.   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 Clinical Psychologist  Neuropsychologist

## 2019-08-03 ENCOUNTER — Encounter: Payer: Self-pay | Admitting: Physical Medicine & Rehabilitation

## 2019-08-03 ENCOUNTER — Other Ambulatory Visit: Payer: Self-pay

## 2019-08-03 ENCOUNTER — Encounter: Payer: Medicare HMO | Admitting: Physical Medicine & Rehabilitation

## 2019-08-03 VITALS — BP 150/70 | HR 77 | Temp 96.1°F | Ht 64.0 in | Wt 90.8 lb

## 2019-08-03 DIAGNOSIS — I69398 Other sequelae of cerebral infarction: Secondary | ICD-10-CM

## 2019-08-03 DIAGNOSIS — I69393 Ataxia following cerebral infarction: Secondary | ICD-10-CM | POA: Diagnosis not present

## 2019-08-03 DIAGNOSIS — I639 Cerebral infarction, unspecified: Secondary | ICD-10-CM

## 2019-08-03 DIAGNOSIS — F0631 Mood disorder due to known physiological condition with depressive features: Secondary | ICD-10-CM

## 2019-08-03 DIAGNOSIS — R269 Unspecified abnormalities of gait and mobility: Secondary | ICD-10-CM

## 2019-08-03 DIAGNOSIS — F068 Other specified mental disorders due to known physiological condition: Secondary | ICD-10-CM

## 2019-08-03 NOTE — Patient Instructions (Addendum)
Will see how you do with PT, I expect that pain complaints with decrease as activity level and strength improves

## 2019-08-03 NOTE — Progress Notes (Signed)
Subjective:    Patient ID: Brooke Collier, female    DOB: 08-05-1935, 84 y.o.   MRN: [id number] 83 y.o.right-handed femalewith history of hypertension, anxiety, chronic interstitial cystitis. Patient lives alone independent prior to admission to [facility] home. Presented 04/14/2019 lower extremity weakness. Blood pressure 170/79. Admission chemistries unremarkable, SARS coronavirus negative. Cranial CT scan unremarkable for acute intracranial process. Patient did not receive TPA. MRI showed acute on chronic small vessel ischemia in the thalami. An 8 mm right lateral thalamic internal capsule lacunar noted along with evidence of punctate contralateral left ventral thalamic infarction. CT angiogram of head and neck showed generalized Ectasiawith mild atherosclerotic changes with 50% left supraclinoid ICA stenosis only. Echocardiogram with ejection fraction of 65% without emboli. Aspirin and Plavix added for CVA prophylaxis x3 weeks then Plavix alone.  Admit date:04/19/2019 Discharge date:04/29/2019 HPI  Staying by herself Mod I dressing and bathing  Fixes her own food Doing her own bills  Sleeps poorly at night   Has bilateral knee pain "pulled back" but this is improving Groceries are delivered  Stopped oxybutnin, buspar,requip  and had improvement with the nausea.  Still having problems gaining weight. Has OP therapy schedule but due to relocating to [facility] has not started with this.  Has had some Neuropsych visits with Dr Marge Duncans   Pain Inventory Average Pain 3 Pain Right Now 3 My pain is intermittent and aching  In the last 24 hours, has pain interfered with the following? General activity 3 Relation with others 3 Enjoyment of life 3 What TIME of day is your pain at its worst? evening Sleep (in general) Poor  Pain is worse with: bending Pain improves with: heat/ice, pacing activities and medication Relief from Meds: 6  Mobility use a walker how many  minutes can you walk? unknown ability to climb steps?  yes do you drive?  no  Function retired I need assistance with the following:  bathing, household duties and shopping Do you have any goals in this area?  yes  Neuro/Psych bladder control problems weakness trouble walking confusion depression anxiety  Prior Studies Any changes since last visit?  no  Physicians involved in your care Any changes since last visit?  no   Family History  Problem Relation Age of Onset  . Heart disease Mother   . Heart disease Father   . Anxiety disorder Sister   . Heart disease Brother   . Anxiety disorder Brother    Social History   Socioeconomic History  . Marital status: Single    Spouse name: Not on file  . Number of children: Not on file  . Years of education: Not on file  . Highest education level: Not on file  Occupational History  . Not on file  Tobacco Use  . Smoking status: Never Smoker  . Smokeless tobacco: Never Used  Vaping Use  . Vaping Use: Never used  Substance and Sexual Activity  . Alcohol use: No  . Drug use: No  . Sexual activity: Not Currently    Birth control/protection: Post-menopausal  Other Topics Concern  . Not on file  Social History Narrative  . Not on file   Social Determinants of Health   Financial Resource Strain:   . Difficulty of Paying Living Expenses:   Food Insecurity:   . Worried About Charity fundraiser in the Last Year:   . Arboriculturist in the Last Year:   Transportation Needs:   . Film/video editor (Medical):   [name]  Lack of Transportation (Non-Medical):   Physical Activity:   . Days of Exercise per Week:   . Minutes of Exercise per Session:   Stress:   . Feeling of Stress :   Social Connections:   . Frequency of Communication with Friends and Family:   . Frequency of Social Gatherings with Friends and Family:   . Attends Religious Services:   . Active Member of Clubs or Organizations:   . Attends Theatre manager Meetings:   [name] Marital Status:    Past Surgical History:  Procedure Laterality Date  . ABDOMINAL HYSTERECTOMY    . INTRAMEDULLARY (IM) NAIL INTERTROCHANTERIC  05/04/2016  . INTRAMEDULLARY (IM) NAIL INTERTROCHANTERIC Left 05/04/2016   Procedure: INTRAMEDULLARY (IM) NAIL INTERTROCHANTRIC;  Surgeon: Mcarthur Rossetti, MD;  Location: [facility];  Service: Orthopedics;  Laterality: Left;  . tonsillectomyy     Past Medical History:  Diagnosis Date  . Allergy   . Chronic interstitial cystitis   . Hypertension    BP (!) 150/70   Pulse 77   Temp (!) 96.1 F (35.6 C)   Ht 5\' 4"  (1.626 m)   Wt 90 lb 12.8 oz (41.2 kg)   SpO2 98%   BMI 15.59 kg/m   Opioid Risk Score:   Fall Risk Score:  `1  Depression screen PHQ 2/9  Depression screen [facility] 2/9 06/17/2019 06/03/2019  Decreased Interest 1 2  Down, Depressed, Hopeless 1 1  PHQ - 2 Score 2 3  Altered sleeping - 3  Tired, decreased energy - 3  Change in appetite - 0  Feeling bad or failure about yourself  - 3  Trouble concentrating - 1  Moving slowly or fidgety/restless - 1  Suicidal thoughts - 0  PHQ-9 Score - 14  Difficult doing work/chores - Somewhat difficult   Review of Systems  Constitutional: Negative.   HENT: Positive for sinus pressure.   Eyes: Negative.   Respiratory: Positive for cough.   Cardiovascular: Positive for leg swelling.  Gastrointestinal: Negative.   Endocrine: Negative.   Genitourinary: Positive for dysuria.  Musculoskeletal: Positive for back pain and gait problem.  Neurological: Positive for dizziness and weakness.  Hematological: Bruises/bleeds easily.  Psychiatric/Behavioral:       Anxiety, depression       Objective:   Physical Exam Vitals and nursing note reviewed.  Constitutional:      Appearance: She is ill-appearing.  Eyes:     Extraocular Movements: Extraocular movements intact.     Conjunctiva/sclera: Conjunctivae normal.     Pupils: Pupils are equal, round, and reactive  to light.  Neurological:     Mental Status: She is alert.  Psychiatric:        Attention and Perception: Attention normal.        Mood and Affect: Mood is anxious.        Speech: Speech normal.        Behavior: Behavior normal.        Thought Content: Thought content normal.   4/5 strength bilateral deltoid bicep tricep grip hip flexor knee extensor ankle dorsiflexor Ambulates with wide-based support no evidence of toe drag or knee instability needs bilateral him assistance.        Assessment & Plan:  #1.  Small subcortical infarcts, has decreased balance but otherwise no significant focal weakness.  He is back to living independently for basic care but requires assistance with complex ADLs. He remains anxious regarding her situation.  She is worried that she will not  get back to her baseline.  She will be starting outpatient PT and this should help Follow-up with neuropsychology Dr. Marge Duncans Discussed recommendations with patient and her son

## 2019-08-04 ENCOUNTER — Encounter: Payer: Medicare HMO | Admitting: Psychology

## 2019-08-05 ENCOUNTER — Ambulatory Visit: Payer: Medicare HMO | Admitting: Psychology

## 2019-08-12 ENCOUNTER — Encounter: Payer: Medicare HMO | Admitting: Psychology

## 2019-08-13 ENCOUNTER — Ambulatory Visit (INDEPENDENT_AMBULATORY_CARE_PROVIDER_SITE_OTHER): Payer: Medicare HMO

## 2019-08-13 ENCOUNTER — Encounter: Payer: Self-pay | Admitting: Family Medicine

## 2019-08-13 ENCOUNTER — Ambulatory Visit: Payer: Medicare HMO | Admitting: Family Medicine

## 2019-08-13 DIAGNOSIS — G8929 Other chronic pain: Secondary | ICD-10-CM | POA: Diagnosis not present

## 2019-08-13 DIAGNOSIS — M25552 Pain in left hip: Secondary | ICD-10-CM

## 2019-08-13 DIAGNOSIS — M545 Low back pain: Secondary | ICD-10-CM | POA: Diagnosis not present

## 2019-08-13 NOTE — Progress Notes (Signed)
Office Visit Note   Patient: Brooke Collier           Date of Birth: 01/01/1936           MRN: [id number] Visit Date: 08/13/2019 Requested by: Robyne Peers, MD [address] [place],  [place] [address] PCP: Robyne Peers, MD  Subjective: Chief Complaint  Patient presents with  . Left Hip - Pain    Fell last Wednesday (08/04/19), hitting her left hip on the floor. Can walk. Pain across lower back and into the right hip, as well. In wheelchair today. Has had the back pain since her stroke in March of this year.  . Lower Back - Pain    HPI: She is here with low back and left hip pain.  June 23 she lost her balance and fell landing on her left side.  Able to get up and walk, but has been having a lot of pain in the anterior lateral hip and her lower back.  She has a history of chronic low back pain.  It is a little bit worse since falling.  She is status post left hip fracture with IM nail in 2018.  She has never been told she had a compression fracture.  She does note that she is 3 inches shorter than her maximum height.  She has a history of interstitial cystitis as well as kidney stones.  She also had a stroke in March.              ROS:   All other systems were reviewed and are negative.  Objective: Vital Signs: There were no vitals taken for this visit.  Physical Exam:  General:  Alert and oriented, in no acute distress. Pulm:  Breathing unlabored. Psy:  Normal mood, congruent affect. Skin: No visible bruising. Low back: She is tender primarily at the L5-S1 level in the midline.  No significant tenderness in the upper lumbar spinous processes. Left hip: She has some tenderness on the posterior aspect of the greater trochanter but is equally tender in the asymptomatic right hip.  Very mild pain with passive internal rotation.  Isometric strength testing does not cause pain.  Imaging: XR HIP UNILAT W OR W/O PELVIS 2-3 VIEWS LEFT  Result Date:  08/13/2019 X-rays left hip show intact surgical hardware, no obvious fracture.  No change compared to 2018 x-rays.  She has mild arthritis.  XR Lumbar Spine 2-3 Views  Result Date: 08/13/2019 X-rays lumbar spine reveal compression fractures of L1, L3 and L4.  I believe they are chronic but I do not have comparison films available.  She has moderate to severe L5-S1 degenerative disc disease.   Assessment & Plan: 1.  She is a little more than a week status post fall with low back and left hip pain.   - I do not see a definite fracture but I will ask Dr. [name] to review the films as well. -Weightbearing as tolerated, could have home health physical therapy if not improving in the next week or 2. -She will increase her vitamin D dosage and add vitamin K2.  She will also try glucosamine and turmeric. -She and her son had many questions that were answered today, visit was over 30 minutes.     Procedures: No procedures performed  No notes on file     PMFS History: Patient Active Problem List   Diagnosis Date Noted  . Insomnia 07/06/2019  . Restless leg syndrome 06/22/2019  .  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 Gait disturbance, post-stroke 05/07/2019  . History of lacunar cerebrovascular accident (CVA) 05/05/2019  . Urge incontinence 05/05/2019  . Right-sided lacunar stroke ([place]) 04/19/2019  . Thalamic infarct, acute ([place]) 04/19/2019  . Anxiety state   . Chronic cystitis   . Cerebral thrombosis with cerebral infarction 04/15/2019  . Weakness of extremity 04/14/2019  . Stroke ([place])   . Hypertensive urgency   . Adjustment disorder with anxiety 05/07/2017  . Localized edema 06/12/2016  . Protein-calorie malnutrition, severe 05/06/2016  . Postoperative anemia due to acute blood loss   . Hip fracture ([place]) 05/04/2016  . Closed comminuted intertrochanteric fracture of proximal end of left femur ([place])   . Dizziness 10/05/2014  . Fatigue 10/05/2014  . Lipoma of back 02/15/2014   . Preventative health care 02/15/2014  . Depression 06/28/2013  . Prophylactic antibiotic 06/10/2013  . Microhematuria 05/05/2013  . HYPOTHYROIDISM 11/17/2008  . UNSPECIFIED VITAMIN D DEFICIENCY 11/17/2008  . HLD (hyperlipidemia) 11/17/2008  . ANEMIA 11/17/2008  . 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 Essential hypertension 07/21/2006  . ALLERGIC RHINITIS 07/21/2006  . ARTHRITIS 07/21/2006   Past Medical History:  Diagnosis Date  . Allergy   . Chronic interstitial cystitis   . Hypertension     Family History  Problem Relation Age of Onset  . Heart disease Mother   . Heart disease Father   . Anxiety disorder Sister   . Heart disease Brother   . Anxiety disorder Brother     Past Surgical History:  Procedure Laterality Date  . ABDOMINAL HYSTERECTOMY    . INTRAMEDULLARY (IM) NAIL INTERTROCHANTERIC  05/04/2016  . INTRAMEDULLARY (IM) NAIL INTERTROCHANTERIC Left 05/04/2016   Procedure: INTRAMEDULLARY (IM) NAIL INTERTROCHANTRIC;  Surgeon: Mcarthur Rossetti, MD;  Location: [facility];  Service: Orthopedics;  Laterality: Left;  . tonsillectomyy     Social History   Occupational History  . Not on file  Tobacco Use  . Smoking status: Never Smoker  . Smokeless tobacco: Never Used  Vaping Use  . Vaping Use: Never used  Substance and Sexual Activity  . Alcohol use: No  . Drug use: No  . Sexual activity: Not Currently    Birth control/protection: Post-menopausal

## 2019-08-13 NOTE — Patient Instructions (Addendum)
    Vitamin D3:  Take 5,000 IU daily  Vitamin K2:  Take 100 mcg daily  Glucosamine sulfate:  1,000 mg twice daily  Turmeric:  500 mg twice daily

## 2019-08-19 ENCOUNTER — Encounter: Payer: Medicare HMO | Attending: Physical Medicine & Rehabilitation | Admitting: Psychology

## 2019-08-19 ENCOUNTER — Other Ambulatory Visit: Payer: Self-pay

## 2019-08-19 DIAGNOSIS — N3030 Trigonitis without hematuria: Secondary | ICD-10-CM | POA: Insufficient documentation

## 2019-08-19 DIAGNOSIS — F068 Other specified mental disorders due to known physiological condition: Secondary | ICD-10-CM | POA: Diagnosis not present

## 2019-08-19 DIAGNOSIS — I69398 Other sequelae of cerebral infarction: Secondary | ICD-10-CM | POA: Insufficient documentation

## 2019-08-19 DIAGNOSIS — F0631 Mood disorder due to known physiological condition with depressive features: Secondary | ICD-10-CM | POA: Diagnosis not present

## 2019-08-19 DIAGNOSIS — R269 Unspecified abnormalities of gait and mobility: Secondary | ICD-10-CM | POA: Diagnosis present

## 2019-08-19 DIAGNOSIS — I6381 Other cerebral infarction due to occlusion or stenosis of small artery: Secondary | ICD-10-CM | POA: Insufficient documentation

## 2019-08-19 DIAGNOSIS — I639 Cerebral infarction, unspecified: Secondary | ICD-10-CM | POA: Diagnosis not present

## 2019-08-19 DIAGNOSIS — I69393 Ataxia following cerebral infarction: Secondary | ICD-10-CM | POA: Diagnosis not present

## 2019-08-24 ENCOUNTER — Other Ambulatory Visit: Payer: Self-pay | Admitting: Physical Medicine and Rehabilitation

## 2019-08-24 ENCOUNTER — Ambulatory Visit: Payer: Medicare HMO | Admitting: Psychology

## 2019-08-26 ENCOUNTER — Encounter: Payer: Self-pay | Admitting: Family Medicine

## 2019-08-26 ENCOUNTER — Ambulatory Visit (INDEPENDENT_AMBULATORY_CARE_PROVIDER_SITE_OTHER): Payer: Medicare HMO | Admitting: Family Medicine

## 2019-08-26 DIAGNOSIS — R109 Unspecified abdominal pain: Secondary | ICD-10-CM | POA: Diagnosis not present

## 2019-08-26 NOTE — Progress Notes (Signed)
Office Visit Note   Patient: Brooke Collier           Date of Birth: 1935-02-13           MRN: [id number] Visit Date: 08/26/2019 Requested by: Robyne Peers, MD [address] [place],  [place] [address] PCP: Robyne Peers, MD  Subjective: Chief Complaint  Patient presents with  . Lower Back - Pain    Was standing in her kitchen yesterday afternoon (with her walker) - started to turn around and she felt a sharp, stabbing pain in the left lower back/flank area. That area is still sore - been taking Tylenol.    HPI: She is here with left side pain.  After last visit she was making some progress.  Feeling steadily better day by day.  Then yesterday while standing in her kitchen, she turned and felt a sharp stabbing pain in the left side area.  It still hurts but not as severely as yesterday.  No radicular symptoms, no spine tenderness.               ROS:   All other systems were reviewed and are negative.  Objective: Vital Signs: There were no vitals taken for this visit.  Physical Exam:  General:  Alert and oriented, in no acute distress. Pulm:  Breathing unlabored. Psy:  Normal mood, congruent affect. Low back: No tenderness to palpation along her thoracolumbar spinous processes today.  She is tender at the distal end of her left 12th rib.  When she is sitting, her ribs are very close to her iliac bone.  The rest of her rib does not hurt, and there is no crepitation or step-off.   Imaging: No results found.  Assessment & Plan: 1.  Left side pain, suspect that her 12th rib contacted her pelvis. -Elected to try a lumbar corset brace for comfort.  She will proceed with physical therapy as already discussed.  Follow-up as needed.    Procedures: No procedures performed  No notes on file     PMFS History: Patient Active Problem List   Diagnosis Date Noted  . Insomnia 07/06/2019  . Restless leg syndrome 06/22/2019  . 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 Gait disturbance, post-stroke 05/07/2019  . History of lacunar cerebrovascular accident (CVA) 05/05/2019  . Urge incontinence 05/05/2019  . Right-sided lacunar stroke ([place]) 04/19/2019  . Thalamic infarct, acute ([place]) 04/19/2019  . Anxiety state   . Chronic cystitis   . Cerebral thrombosis with cerebral infarction 04/15/2019  . Weakness of extremity 04/14/2019  . Stroke ([place])   . Hypertensive urgency   . Adjustment disorder with anxiety 05/07/2017  . Localized edema 06/12/2016  . Protein-calorie malnutrition, severe 05/06/2016  . Postoperative anemia due to acute blood loss   . Hip fracture ([place]) 05/04/2016  . Closed comminuted intertrochanteric fracture of proximal end of left femur ([place])   . Dizziness 10/05/2014  . Fatigue 10/05/2014  . Lipoma of back 02/15/2014  . Preventative health care 02/15/2014  . Depression 06/28/2013  . Prophylactic antibiotic 06/10/2013  . Microhematuria 05/05/2013  . HYPOTHYROIDISM 11/17/2008  . UNSPECIFIED VITAMIN D DEFICIENCY 11/17/2008  . HLD (hyperlipidemia) 11/17/2008  . ANEMIA 11/17/2008  . 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 Essential hypertension 07/21/2006  . ALLERGIC RHINITIS 07/21/2006  . ARTHRITIS 07/21/2006   Past Medical History:  Diagnosis Date  . Allergy   . Chronic interstitial cystitis   . Hypertension  Family History  Problem Relation Age of Onset  . Heart disease Mother   . Heart disease Father   . Anxiety disorder Sister   . Heart disease Brother   . Anxiety disorder Brother     Past Surgical History:  Procedure Laterality Date  . ABDOMINAL HYSTERECTOMY    . INTRAMEDULLARY (IM) NAIL INTERTROCHANTERIC  05/04/2016  . INTRAMEDULLARY (IM) NAIL INTERTROCHANTERIC Left 05/04/2016   Procedure: INTRAMEDULLARY (IM) NAIL INTERTROCHANTRIC;  Surgeon: Mcarthur Rossetti, MD;  Location: [facility];  Service: Orthopedics;  Laterality: Left;  . tonsillectomyy     Social History   Occupational History   . Not on file  Tobacco Use  . Smoking status: Never Smoker  . Smokeless tobacco: Never Used  Vaping Use  . Vaping Use: Never used  Substance and Sexual Activity  . Alcohol use: No  . Drug use: No  . Sexual activity: Not Currently    Birth control/protection: Post-menopausal

## 2019-08-30 ENCOUNTER — Encounter: Payer: Self-pay | Admitting: Psychology

## 2019-08-30 NOTE — Progress Notes (Signed)
NEUROPSYCHOLOGY VISIT  Therapy Progress Note   Patient:   Brooke Collier   DOB:   November 30, 1935  MR Number:  [id number]   Location:  [facility] [place], [place] [id number] [place] [address]  Dept: [phone]  Date of Service:   08/19/2019   Start Time:   1:00 PM End Time:   2:00 PM  Provider/Observer:  Joelyn Oms, Psy.D., Clinical Neuropsychologist    Billing Code/Service: 96158/96159  Reason for Service: Mrs. Rehberg is an 84 year old, widowed, Caucasian American female who was referred to therapy by Dr. Letta Pate due to reported symptoms of depression and anxiety, chronic pain, and functional difficulties after CVA on 04/14/19    Goal of Session:   The goal of this session was to provide information to patient's son and daughter on home health agencies for managing basic caregiving duties and companion support, continue building rapport with patient, begin working with patient individually on coping strategies to better manage mood and pain, and identify long-and short-term goals and collaborate on treatment plan.    Content of Session: The patient was on time for her scheduled in-person therapy appointment (11:00) and was accompanied by her daughter. Her daughter provided me with a note outlining recent concerns before the appointment started and I provided her with some more information about home health care, meal preparation, and long term care options in her local community. Patient's daughter did not stay for the therapy session and waited in the lobby.   Patient began session by telling me that she fell recently and was experiencing some pain. She also reported that she did not want to participate in her physical therapy appointment and that she decided to attend therapy instead. I reminded her about the importance of participating in physical therapy  on a regular basis and asked why she refused to go. She reportedly does not like the building the P.T. service is held and described experiences a significant amount of pain during her last appointment.    I inquired about patient's current mood and level of pain. Patient endorsed feeling some pain and self-rated it as a 4 or 5/10. She stated that walking and moving around throughout the day helps along with taking Tylenol; she sometimes worries about running out of Tylenol. She continued to describe experiencing more pain in the evening and specifically noted having lower back pain from a recent strain. She mentioned feeling somewhat afraid at night and acknowledged that she tends to worry about a number of different things during this time, which also seems to make her pain and sleep problems worse. She admitted to worrying more about her medical health since having the stroke and also experiencing panic attacks on occasion; she spends a lot of time thinking about her blood pressure, and ways she can "be prepared" for the future (e.g., I'm going to be all alone --> I have to deal with this all by myself--> I won't be able to manage alone --> panic symptoms). She also described feeling confused and/or disoriented on occasion; all of these symptoms negatively impact sleep pattern (I.e., falling and staying asleep). She currently takes "sleeping pills" but expressed desire to stop; she has a fear of being overmedicated. She is able to manage her own medication independently (I.e., organizes pills into box) and reportedly keeps notes (e.g., name, time of consumption, etc.).     Patient's loneliness and fear of  being alone continue to appear as major themes in therapy. Time was spent learning more about her interests (e.g., beach, sunbathing, volunteering, church, etc.), additional sources for emotional and social support, and daily routines, including sleep behaviors. She continues to describe her appetite as  "good" and had no complaints about the meals she ate during the previous week. In fact, she reportedly made all of her own meals and even made one for her son. I used praise and supportive statements to reinforce patient's health eating habits and motivation to make her own meals.   I reviewed diaphragmatic breathing and we spent around 5 minutes in session practicing. She described feeling less pain and more relaxed compared to when she first came in. We reflected on this change. I assigned homework at the end of the sessions and answered any questions she had.     Behavioral Observations:   Patient was polite, attentive, and appreciative. Thought process was logical and goal directed. Thought content was unremarkable. She endorsed having passive suicidal thoughts from time to time but credibly denied any plan or intent. Mood was irritable and depressed. Affect was tense.   Assessment/Plan:   Mrs. Wissink will continue participating in weekly or biweekly individual therapy sessions (20min 2-4 times per month) to treat symptoms of depression, anxiety, and disrupted sleep. Additional therapy sessions may be scheduled based on patient's progress towards treatment goals, severity of symptoms, and/or medical need. Next individual therapy session is scheduled for 08/12/19 at 11:00AM.     Treatment will continue to utilize a cognitive-behavioral approach that incorporates several strategies including the following:    1. Sleep-hygiene education: Goal is to provide guidelines for improving sleep-related behaviors (e.g., avoid napping during the day, avoiding caffeine in the evenings, avoiding strenuous physical and mental activity just before bedtime)   2. Stimulus Control: Goal is to strengthen bed and bedroom as cues for sleep and weaken them as cues for other activities.    3. Relaxation Training: Goal is to reduce anxiety and stress and promote mental and physical relaxation.    4. Cognitive Therapy:  Goal: correct faulty attitudes and beliefs about sleep and counterproductive sleep strategies.    Other treatment goals that may be relevant for the client include increasing her pleasurable activities and social contact, decreasing worry, and assisting with healthy weight loss plan (e.g., diet, exercise, eating behavior, etc.).  Short-term Therapy Goal: In bed Sleeping by 12:00am        Patient's Self-reported Goal(s):  1. In bed asleep by 10:00 - 10:30pm most days of the week.   2. Reduce time spent worrying and feel less anxious during the day 3. Eliminate panic attacks  4. Stop taking sleep medication  5. Healthy weight gain 5. Increase level of physical activity    Home work Assignment:   Practice Diaphragmatic breathing at least once for 5 minutes during the next week.   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 Clinical Psychologist  Neuropsychologist

## 2019-09-02 ENCOUNTER — Encounter: Payer: Self-pay | Admitting: Adult Health

## 2019-09-02 ENCOUNTER — Other Ambulatory Visit: Payer: Self-pay

## 2019-09-02 ENCOUNTER — Ambulatory Visit: Payer: Medicare HMO | Admitting: Adult Health

## 2019-09-02 ENCOUNTER — Encounter: Payer: Medicare HMO | Admitting: Psychology

## 2019-09-02 VITALS — BP 120/70 | HR 69 | Ht 64.0 in | Wt 96.0 lb

## 2019-09-02 DIAGNOSIS — I10 Essential (primary) hypertension: Secondary | ICD-10-CM

## 2019-09-02 DIAGNOSIS — I6381 Other cerebral infarction due to occlusion or stenosis of small artery: Secondary | ICD-10-CM

## 2019-09-02 DIAGNOSIS — F329 Major depressive disorder, single episode, unspecified: Secondary | ICD-10-CM

## 2019-09-02 DIAGNOSIS — F419 Anxiety disorder, unspecified: Secondary | ICD-10-CM

## 2019-09-02 DIAGNOSIS — E785 Hyperlipidemia, unspecified: Secondary | ICD-10-CM | POA: Diagnosis not present

## 2019-09-02 DIAGNOSIS — I639 Cerebral infarction, unspecified: Secondary | ICD-10-CM | POA: Diagnosis not present

## 2019-09-02 NOTE — Patient Instructions (Signed)
Highly recommend restarting therapy once able for hopeful further recovery and to avoid deconditioning  Continue clopidogrel 75 mg daily  and atorvastatin for secondary stroke prevention  Continue to follow up with PCP regarding cholesterol and blood pressure management  Maintain strict control of hypertension with blood pressure goal below 130/90, diabetes with hemoglobin A1c goal below 6.5% and cholesterol with LDL cholesterol (bad cholesterol) goal below 70 mg/dL.   Continue to follow with PCP and Dr. Darol Destine regarding depression and anxiety      Followup in the future with me in 6 months or call earlier if needed       Thank you for coming to see [place] at [facility]. I hope we have been able to provide you high quality care today.  You may receive a patient satisfaction survey over the next few weeks. We would appreciate your feedback and comments so that we may continue to improve ourselves and the health of our patients.

## 2019-09-02 NOTE — Progress Notes (Signed)
I agree with the above plan 

## 2019-09-02 NOTE — Progress Notes (Signed)
[facility] [address] [place]. [place] [address] [phone]       STROKE FOLLOW UP NOTE  Ms. Brooke Collier Date of Birth:  1935/12/07 Medical Record Number:  [id number]   Reason for Referral: stroke follow up    SUBJECTIVE:   CHIEF COMPLAINT:  Chief Complaint  Patient presents with  . Follow-up    tx rm here for a f/u    HPI:   Today, 09/02/2019, Brooke Collier returns for stroke follow up accompanied by her daughter. Residual deficits of LLE weakness and gait impairment.  Recent fall causing back pain greater with ambulation.  Evaluated by orthopedics without acute injury.  Therapy is currently placed on hold but she is thinking about pursuing home health therapy.  Currently living alone but family checks on her frequently.  Continues difficulty with depression, anxiety, insomnia. Continued counseling with Dr. Darol Destine and medication management by PCP. Remains on Lexapro and xanax PRN with PCP recently starting trazodone d/t insomnia and discontinuing Buspar.  Only use trazodone for couple nights and then self discontinued as she was fearful she would not wake up to use the bathroom.  Experiences nocturia multiple times throughout the night.  Denies new stroke/TIA symptoms. Continues plavix and atorvastatin without side effects. Blood pressure today 120/70. No further concerns at this time.    History provided for reference purposes only Update 06/03/2019, Brooke Collier is being seen for hospital follow-up accompanied by her son.  Residual deficits of left-sided dysmetria, gait impairment and left leg numbness with ongoing improvement.  Continues to participate in home health PT with ongoing improvement of ambulation.  Continues to use rolling walker at all times.  She does live in her own home but has family staying with her majority of the day.  Completed 3 weeks DAPT and continues on Plavix without bleeding or bruising.  Continues on atorvastatin 40 mg daily  without myalgias.  Blood pressures 132/70.  Underlying anxiety slightly worsened post stroke as well as complaints of occasional dizziness and nausea especially with increased pain or after excessive exertion.  Currently on BuSpar 5 mg twice daily and Xanax as needed.  No further  concerns at this time.  Hospital summary Personally reviewed pertinent hospital notes, labs and imaging Brooke Collier is a 84 y.o. female with history of hypertension, anxiety and interstitial cystitis who presented on 04/14/2019 with LE weakness, possibly worse on the L in setting of SBP > 200.    Stroke work-up revealed bilateral thalamic lacunar infarcts secondary to small vessel disease.  Recommended DAPT for 3 weeks and Plavix alone as previously on aspirin.  BP stabilized during admission and recommended long-term BP goal normotensive range.  LDL 109 initiate atorvastatin 40 mg daily.  No prior history of stroke.  Evaluated by therapies and recommended CIR upon discharge.   Stroke:  bilateral thalamic lacunar infarcts secondary to small vessel disease    Code Stroke CT head No acute abnormality. Small vessel disease. ASPECTS 10.     MRI  Acute on chronic thalamic small vessel disease. R thalamic lacune and punctate L thalamic infarct.  Chronic micro hemorrhages and small vessel disease.   CTA head & neck no LVO. Generalized mild ectasia and atherosclerosis head and neck . L ICA siphon 50% calcified plaque.   2D Echo  EF 65% without cardiac source of embolus identified  LDL 109 -initiated atorvastatin  HgbA1c 5.0  Lovenox 30 mg sq daily for VTE prophylaxis  aspirin 325 mg daily  prior to admission, now on aspirin 325 mg daily. Given  mild stroke, recommend aspirin 81 mg and plavix 75 mg daily x 3 weeks, then PLAVIX alone. Orders adjusted.   Therapy recommendations:  CIR        ROS:   14 system review of systems performed and negative with exception of gait impairment and anxiety  PMH:  Past  Medical History:  Diagnosis Date  . Allergy   . Chronic interstitial cystitis   . Hypertension     PSH:  Past Surgical History:  Procedure Laterality Date  . ABDOMINAL HYSTERECTOMY    . INTRAMEDULLARY (IM) NAIL INTERTROCHANTERIC  05/04/2016  . INTRAMEDULLARY (IM) NAIL INTERTROCHANTERIC Left 05/04/2016   Procedure: INTRAMEDULLARY (IM) NAIL INTERTROCHANTRIC;  Surgeon: Mcarthur Rossetti, MD;  Location: [facility];  Service: Orthopedics;  Laterality: Left;  . tonsillectomyy      Social History:  Social History   Socioeconomic History  . Marital status: Single    Spouse name: Not on file  . Number of children: Not on file  . Years of education: Not on file  . Highest education level: Not on file  Occupational History  . Not on file  Tobacco Use  . Smoking status: Never Smoker  . Smokeless tobacco: Never Used  Vaping Use  . Vaping Use: Never used  Substance and Sexual Activity  . Alcohol use: No  . Drug use: No  . Sexual activity: Not Currently    Birth control/protection: Post-menopausal  Other Topics Concern  . Not on file  Social History Narrative  . Not on file   Social Determinants of Health   Financial Resource Strain:   . Difficulty of Paying Living Expenses:   Food Insecurity:   . Worried About Charity fundraiser in the Last Year:   . Arboriculturist in the Last Year:   Transportation Needs:   . Film/video editor (Medical):   [name] Lack of Transportation (Non-Medical):   Physical Activity:   . Days of Exercise per Week:   . Minutes of Exercise per Session:   Stress:   . Feeling of Stress :   Social Connections:   . Frequency of Communication with Friends and Family:   . Frequency of Social Gatherings with Friends and Family:   . Attends Religious Services:   . Active Member of Clubs or Organizations:   . Attends Archivist Meetings:   [name] Marital Status:   Intimate Partner Violence:   . Fear of Current or Ex-Partner:   . Emotionally  Abused:   [name] Physically Abused:   . Sexually Abused:     Family History:  Family History  Problem Relation Age of Onset  . Heart disease Mother   . Heart disease Father   . Anxiety disorder Sister   . Heart disease Brother   . Anxiety disorder Brother     Medications:   Current Outpatient Medications on File Prior to Visit  Medication Sig Dispense Refill  . acetaminophen (TYLENOL) 325 MG tablet Take 2 tablets (650 mg total) by mouth every 6 (six) hours as needed (Pain, Hadache or Fever >/= 101).    [name] ALPRAZolam (XANAX) 0.25 MG tablet Take 0.5 tablets (0.125 mg total) by mouth at bedtime as needed for anxiety. (Patient not taking: Reported on 08/03/2019) 30 tablet 0  . amLODipine (NORVASC) 2.5 MG tablet Take 1 tablet (2.5 mg total) by mouth daily. 30 tablet 0  . atorvastatin (LIPITOR) 40 MG tablet TAKE  1 TABLET BY MOUTH EVERY DAY AT 6PM FOR CHOLESTEROL 30 tablet 0  . busPIRone (BUSPAR) 5 MG tablet Take 1 tablet (5 mg total) by mouth 2 (two) times daily. (Patient not taking: Reported on 08/03/2019) 60 tablet 0  . Calcium Carb-Cholecalciferol (CALCIUM-VITAMIN D) 500-200 MG-UNIT tablet Take 1 tablet by mouth daily.    . carboxymethylcellulose (REFRESH PLUS) 0.5 % SOLN Place 1 drop into both eyes daily as needed (dry eyes). 30 mL 3  . clopidogrel (PLAVIX) 75 MG tablet Take 1 tablet (75 mg total) by mouth daily. 30 tablet 1  . Co-Enzyme Q-10 30 MG CAPS Take 1 capsule (30 mg total) by mouth daily. 30 capsule 5  . diclofenac Sodium (VOLTAREN) 1 % GEL Apply 2 g topically 4 (four) times daily. 2 g 1  . escitalopram (LEXAPRO) 5 MG tablet Take 1 tablet (5 mg total) by mouth daily. 30 tablet 3  . flavoxATE (URISPAS) 100 MG tablet Take 1 tablet (100 mg total) by mouth 2 (two) times daily as needed for bladder spasms. 60 tablet 0  . irbesartan (AVAPRO) 150 MG tablet TAKE 1 TABLET BY MOUTH EVERY DAY 30 tablet 0  . Magnesium 100 MG TABS Take 1 tablet (100 mg total) by mouth daily. (Patient taking  differently: Take 300 mg by mouth daily. ) 30 tablet 5  . Misc Natural Products (CYSTEX URINARY HEALTH) LIQD Take 1 tablet by mouth in the morning and at bedtime. (Patient not taking: Reported on 08/03/2019) 30 mL 5  . ondansetron (ZOFRAN) 4 MG tablet Take 4 mg by mouth every 8 (eight) hours as needed. (Patient not taking: Reported on 08/03/2019)    . Probiotic Product (PROBIOTIC DAILY PO) Take by mouth daily.    . QC CO Q-10 100 MG capsule TAKE 1 CAPSULE BY MOUTH EACH DAY    . traZODone (DESYREL) 50 MG tablet Take 50 mg by mouth at bedtime.    . Vitamin D, Cholecalciferol, 50 MCG (2000 UT) CAPS Take by mouth in the morning and at bedtime.     No current facility-administered medications on file prior to visit.    Allergies:   Allergies  Allergen Reactions  . Ciprofloxacin     Muscle weakness  . Zoloft [Sertraline] Nausea And Vomiting      OBJECTIVE:  Physical Exam  Vitals:   09/02/19 1403  BP: 120/70  Pulse: 69  Weight: (!) 96 lb (43.5 kg)  Height: 5\' 4"  (1.626 m)   Body mass index is 16.48 kg/m. No exam data present  General: Frail pleasant elderly Caucasian female, seated, in no evident distress Head: head normocephalic and atraumatic.   Neck: supple with no carotid or supraclavicular bruits Cardiovascular: regular rate and rhythm, no murmurs Musculoskeletal: no deformity Skin:  no rash/petichiae Vascular:  Normal pulses all extremities   Neurologic Exam Mental Status: Awake and fully alert. Normal speech and language. Oriented to place and time. Recent and remote memory intact. Attention span, concentration and fund of knowledge appropriate. Mood and affect appropriate.  Cranial Nerves: Pupils equal, briskly reactive to light. Extraocular movements full without nystagmus. Visual fields full to confrontation. Hearing intact. Facial sensation intact. Face, tongue, palate moves normally and symmetrically.  Motor: Normal bulk and tone. Normal strength in all tested  extremity muscles except mild left hip flexor weakness. Sensory.: intact to touch , pinprick , position and vibratory sensation.  Coordination: Rapid alternating movements normal in all extremities. Finger-to-nose and heel-to-shin evidence of left-sided dysmetria. Gait and Station: Deferred as  rolling walker not present during visit Reflexes: 1+ and symmetric. Toes downgoing.        ASSESSMENT: Brooke Collier is a 84 y.o. year old female presented with bilateral lower extremity weakness and heaviness and hypertensive urgency on 04/14/2019 with stroke work-up revealing bilateral thalamic lacunar infarcts secondary to small vessel disease. Vascular risk factors include HTN, HLD, generalized arterial estasia with mild arthrosclerosis in head and neck and calcified plaque 50% stenosis left ICA siphon.      PLAN:  1. Bilateral thalamic stroke:  -residual deficit: left sided dysmetria and gait impairment.  Encouraged restarting therapy once able and use of rolling walker at all times for fall prevention.  Due to gait impairment and intolerance of prolonged ambulation, handicap placard provided per daughter's request -Continue clopidogrel 75 mg daily  and atorvastatin 40 mg daily for secondary stroke prevention.  -close f/u with PCP for aggressive stroke risk factor management  2. HTN: BP goal <130/90.  Stable today.  Follow-up with PCP for monitoring and management 3. HLD: LDL goal <70.  Continue atorvastatin ensure close PCP follow-up for repeat lipid panel, prescribing of statin and ongoing monitoring and management 4. Anxiety/depression: Underlying history worsened post stroke.  Current counseling with Dr. Darol Destine and medication management with PCP.  Reports improvement compared to prior visit.  Continue Lexapro 5 mg daily, Xanax as needed and trazodone nightly for insomnia.    Follow up in 6 months or call earlier if needed   I spent 30 minutes of face-to-face and non-face-to-face time  with patient and daughter.  This included previsit chart review, lab review, study review, order entry, electronic health record documentation, patient education regarding recent stroke, residual deficits, importance of managing stroke risk factors and answered all questions to patient satisfaction   Frann Rider, [facility]  [facility] [address] [place], [place] [address]  Phone [phone] Fax [phone] Note: This document was prepared with digital dictation and possible smart phrase technology. Any transcriptional errors that result from this process are unintentional.

## 2019-09-09 ENCOUNTER — Encounter: Payer: Medicare HMO | Admitting: Psychology

## 2019-09-24 ENCOUNTER — Other Ambulatory Visit: Payer: Self-pay | Admitting: Adult Health

## 2019-09-24 ENCOUNTER — Other Ambulatory Visit: Payer: Self-pay | Admitting: Physical Medicine and Rehabilitation

## 2019-09-28 ENCOUNTER — Telehealth: Payer: Self-pay

## 2019-09-29 ENCOUNTER — Other Ambulatory Visit: Payer: Self-pay | Admitting: Physical Medicine and Rehabilitation

## 2019-09-30 ENCOUNTER — Telehealth: Payer: Self-pay | Admitting: Adult Health

## 2019-09-30 MED ORDER — CLOPIDOGREL BISULFATE 75 MG PO TABS: 75.0000 mg | ORAL_TABLET | Freq: Every day | ORAL | 1 refills | Status: AC

## 2019-09-30 NOTE — Telephone Encounter (Signed)
Stephens November, Jay(son on DPR) is calling re: pt's clopidogrel (PLAVIX) 75 MG tablet.  Ulice Dash states he has been told by pt's PCP that this needs to be filled by pt's Neurologist.  Son states pt is without medication and he is unsure as to what the pcp's office is going to do.  Son states he does not know if during pt's last office visit with Jessica,NP if her taking over the prescription for clopidogrel (PLAVIX) 75 MG tablet was discussed because his sister brought pt.  Ulice Dash is asking for a call.  Ulice Dash is aware the office is not open on Fridays.

## 2019-09-30 NOTE — Telephone Encounter (Signed)
I called pts son and I relayed that refilled for pt 30 day and one refill plavix 75mg  po daily.  Will refill until sen Jessica,NP in January as he stated pcp did not want to fill this for her.

## 2019-09-30 NOTE — Telephone Encounter (Signed)
error 

## 2019-09-30 NOTE — Addendum Note (Signed)
Addended by: Brandon Melnick on: 09/30/2019 05:26 PM   Modules accepted: Orders

## 2019-11-04 ENCOUNTER — Other Ambulatory Visit: Payer: Self-pay

## 2019-11-04 ENCOUNTER — Encounter: Payer: Medicare HMO | Attending: Physical Medicine & Rehabilitation | Admitting: Physical Medicine & Rehabilitation

## 2019-11-04 ENCOUNTER — Encounter: Payer: Self-pay | Admitting: Physical Medicine & Rehabilitation

## 2019-11-04 VITALS — BP 168/87 | HR 63 | Temp 98.9°F | Ht 64.0 in | Wt 94.2 lb

## 2019-11-04 DIAGNOSIS — N3030 Trigonitis without hematuria: Secondary | ICD-10-CM | POA: Diagnosis present

## 2019-11-04 DIAGNOSIS — R269 Unspecified abnormalities of gait and mobility: Secondary | ICD-10-CM | POA: Diagnosis not present

## 2019-11-04 DIAGNOSIS — I69398 Other sequelae of cerebral infarction: Secondary | ICD-10-CM | POA: Diagnosis not present

## 2019-11-04 DIAGNOSIS — I6381 Other cerebral infarction due to occlusion or stenosis of small artery: Secondary | ICD-10-CM | POA: Diagnosis present

## 2019-11-04 DIAGNOSIS — I69393 Ataxia following cerebral infarction: Secondary | ICD-10-CM | POA: Insufficient documentation

## 2019-11-04 NOTE — Progress Notes (Signed)
Subjective:    Patient ID: Brooke Collier, female    DOB: 07-02-1935, 84 y.o.   MRN: [id number] 83 y.o.right-handed femalewith history of hypertension, anxiety, chronic interstitial cystitis. Patient lives alone independent prior to admission to level home. Presented 04/14/2019 lower extremity weakness. Blood pressure 170/79. Admission chemistries unremarkable, SARS coronavirus negative. Cranial CT scan unremarkable for acute intracranial process. Patient did not receive TPA. MRI showed acute on chronic small vessel ischemia in the thalami. An 8 mm right lateral thalamic internal capsule lacunar noted along with evidence of punctate contralateral left ventral thalamic infarction. CT angiogram of head and neck showed generalized Ectasiawith mild atherosclerotic changes with 50% left supraclinoid ICA stenosis only. Echocardiogram with ejection fraction of 65% without emboli. Aspirin and Plavix added for CVA prophylaxis x3 weeks then Plavix alone.   Admit date:04/19/2019 Discharge date:04/29/2019 HPI CC Pain  Pt without recent fall , c/o" back pain "  No pain going down legs, no new bowel or bladder dysfunction, has chronic bladder pain and sees Dr Lawrence Santiago. Has seen Dr Junius Roads for this problem in July and has an appt with him tomorrow am for this problem.  Other concerns are weight loss, review of OV from June 2021 shows pt has gained 4 lb.  Pt reluctant to drink ensure because hi sugar content causes issues with interstitial cystitis  Pt still indep with dressing and bathing , simple ADLs , son helps with shopping  Pain Inventory Average Pain 8 Pain Right Now 8 My pain is constant, sharp, burning and aching  In the last 24 hours, has pain interfered with the following? General activity 10 Relation with others 8 Enjoyment of life 9 What TIME of day is your pain at its worst? morning , daytime, evening and night Sleep (in general) NA  Pain is worse with: walking, bending,  sitting and standing Pain improves with: heat/ice and medication Relief from Meds: 2  Family History  Problem Relation Age of Onset   Heart disease Mother    Heart disease Father    Anxiety disorder Sister    Heart disease Brother    Anxiety disorder Brother    Social History   Socioeconomic History   Marital status: Single    Spouse name: Not on file   Number of children: Not on file   Years of education: Not on file   Highest education level: Not on file  Occupational History   Not on file  Tobacco Use   Smoking status: Never Smoker   Smokeless tobacco: Never Used  Vaping Use   Vaping Use: Never used  Substance and Sexual Activity   Alcohol use: No   Drug use: No   Sexual activity: Not Currently    Birth control/protection: Post-menopausal  Other Topics Concern   Not on file  Social History Narrative   Not on file   Social Determinants of Health   Financial Resource Strain:    Difficulty of Paying Living Expenses: Not on file  Food Insecurity:    Worried About Running Out of Food in the Last Year: Not on file   [employer] of Food in the Last Year: Not on file  Transportation Needs:    Lack of Transportation (Medical): Not on file   Lack of Transportation (Non-Medical): Not on file  Physical Activity:    Days of Exercise per Week: Not on file   Minutes of Exercise per Session: Not on file  Stress:    Feeling of Stress :  Not on file  Social Connections:    Frequency of Communication with Friends and Family: Not on file   Frequency of Social Gatherings with Friends and Family: Not on file   Attends Religious Services: Not on file   Active Member of Clubs or Organizations: Not on file   Attends Archivist Meetings: Not on file   Marital Status: Not on file   Past Surgical History:  Procedure Laterality Date   ABDOMINAL HYSTERECTOMY     INTRAMEDULLARY (IM) NAIL INTERTROCHANTERIC  05/04/2016   INTRAMEDULLARY (IM)  NAIL INTERTROCHANTERIC Left 05/04/2016   Procedure: INTRAMEDULLARY (IM) NAIL INTERTROCHANTRIC;  Surgeon: Mcarthur Rossetti, MD;  Location: [facility];  Service: Orthopedics;  Laterality: Left;   tonsillectomyy     Past Surgical History:  Procedure Laterality Date   ABDOMINAL HYSTERECTOMY     INTRAMEDULLARY (IM) NAIL INTERTROCHANTERIC  05/04/2016   INTRAMEDULLARY (IM) NAIL INTERTROCHANTERIC Left 05/04/2016   Procedure: INTRAMEDULLARY (IM) NAIL INTERTROCHANTRIC;  Surgeon: Mcarthur Rossetti, MD;  Location: [facility];  Service: Orthopedics;  Laterality: Left;   tonsillectomyy     Past Medical History:  Diagnosis Date   Allergy    Chronic interstitial cystitis    Hypertension    BP (!) 168/87    Pulse 63    Temp 98.9 F (37.2 C)    Ht 5\' 4"  (1.626 m)    Wt 94 lb 3.2 oz (42.7 kg)    SpO2 97%    BMI 16.17 kg/m   Opioid Risk Score:   Fall Risk Score:  `1  Depression screen PHQ 2/9  Depression screen [facility] 2/9 11/04/2019 08/03/2019 06/17/2019 06/03/2019  Decreased Interest 1 0 1 2  Down, Depressed, Hopeless 1 1 1 1   PHQ - 2 Score 2 1 2 3   Altered sleeping - 3 - 3  Tired, decreased energy - 1 - 3  Change in appetite - 0 - 0  Feeling bad or failure about yourself  - 1 - 3  Trouble concentrating - 1 - 1  Moving slowly or fidgety/restless - 0 - 1  Suicidal thoughts - 0 - 0  PHQ-9 Score - 7 - 14  Difficult doing work/chores - - - Somewhat difficult   Review of Systems  Constitutional: Negative.   HENT: Negative.   Eyes: Negative.   Respiratory: Negative.   Cardiovascular: Negative.   Gastrointestinal: Negative.   Genitourinary: Negative.   Musculoskeletal: Positive for back pain and gait problem.  Skin: Negative.   Allergic/Immunologic: Negative.   Neurological: Positive for weakness.  Hematological:       Plavix  Psychiatric/Behavioral: Negative.   All other systems reviewed and are negative.      Objective:   Physical Exam Vitals and nursing note reviewed.   Constitutional:      Appearance: She is ill-appearing.  Eyes:     Extraocular Movements: Extraocular movements intact.     Conjunctiva/sclera: Conjunctivae normal.     Pupils: Pupils are equal, round, and reactive to light.  Musculoskeletal:     Comments: No tenderness over cervical , thoracic or lumbar spine  + kyphosis Pain over "floating ribs" at iliac crest  Neurological:     Mental Status: She is alert and oriented to person, place, and time.     Motor: No tremor or abnormal muscle tone.     Coordination: Coordination normal. Rapid alternating movements normal.     Comments: Motor 5-/5 in Bilateral delt, bi, tri grip , HF, KE 4/5 R ADF, 5-  Left ADF  Neg SLR  Psychiatric:        Mood and Affect: Mood is anxious and depressed.        Speech: Speech normal.        Behavior: Behavior is withdrawn.           Assessment & Plan:  1.  Hx of bilateral small thalamic infarcts, pt has reached modified independent level and is at her new baseline, current issues center around pain issues likely related to thoracic kyphosis and rib on iliac crest positioning, pt has been seeing Dr Junius Roads for this and will see him tomorrow  2.  Interstitial cystitis- chronic bladder pain- f/u Dr Amalia Hailey urology  3.  Weight loss poor intake , worries about how foods may affect her IC.  Recommend f/u with PCP.  Rec Glucerna as a lower glucose supplement  PMR f/u PRN

## 2019-11-04 NOTE — Patient Instructions (Signed)
Try Glucerna supplement instead of ensure for lower sugar content

## 2019-11-05 ENCOUNTER — Ambulatory Visit (INDEPENDENT_AMBULATORY_CARE_PROVIDER_SITE_OTHER): Payer: Medicare HMO | Admitting: Family Medicine

## 2019-11-05 ENCOUNTER — Encounter: Payer: Self-pay | Admitting: Family Medicine

## 2019-11-05 DIAGNOSIS — G8929 Other chronic pain: Secondary | ICD-10-CM

## 2019-11-05 DIAGNOSIS — M546 Pain in thoracic spine: Secondary | ICD-10-CM | POA: Diagnosis not present

## 2019-11-05 DIAGNOSIS — M545 Low back pain: Secondary | ICD-10-CM | POA: Diagnosis not present

## 2019-11-05 DIAGNOSIS — R109 Unspecified abdominal pain: Secondary | ICD-10-CM

## 2019-11-05 DIAGNOSIS — R0781 Pleurodynia: Secondary | ICD-10-CM

## 2019-11-05 MED ORDER — GABAPENTIN 100 MG PO CAPS: ORAL_CAPSULE | ORAL | 3 refills | Status: AC

## 2019-11-05 NOTE — Progress Notes (Signed)
Order for brace, demographics and ov note emailed to [employer].

## 2019-11-05 NOTE — Progress Notes (Signed)
Office Visit Note   Patient: Brooke Collier           Date of Birth: Sep 22, 1935           MRN: [id number] Visit Date: 11/05/2019 Requested by: Robyne Peers, MD [address] [place],   [id number] PCP: Robyne Peers, MD  Subjective: Chief Complaint  Patient presents with  . Lower Back - Pain    Constant pain in the lower back. Now has pain around her ribs, bilaterally, since her last visit - worse in the last week. Occasional "cutting" sharp pains. No known injury.     HPI: He is here with persistent back pain.  She is having right-sided rib pain now as well, which keeps her from sleeping at night and bothers her whenever she stands up to walk.  She is very reluctant to take any medications.  She is hesitant to consider any injections.  Her son is with her today and is frustrated that she is in such pain and we cannot do anything today to address it.  At first she felt like the lumbar corset brace was helping but in reality and really does not seem to be doing much for her.              ROS:   All other systems were reviewed and are negative.  Objective: Vital Signs: There were no vitals taken for this visit.  Physical Exam:  General:  Alert and oriented, in no acute distress. Pulm:  Breathing unlabored. Psy:  Normal mood, congruent affect.  Back: She has thoracic kyphosis.  She is tender diffusely along the right-sided rib cage.  No crepitation or step-off.  Imaging: No results found.  Assessment & Plan: 1.  Chronic thoracic pain with right-sided rib pain, underlying thoracic kyphosis and multiple compression fractures. - Trial of Spinomed brace. - Gabapentin. - MRI thoracic spine and lumbar spine, possible ESI vs. vertebroplasty.     Procedures: No procedures performed  No notes on file     PMFS History: Patient Active Problem List   Diagnosis Date Noted  . Insomnia 07/06/2019  . Restless leg syndrome 06/22/2019  . 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 Gait disturbance, post-stroke 05/07/2019  . History of lacunar cerebrovascular accident (CVA) 05/05/2019  . Urge incontinence 05/05/2019  . Right-sided lacunar stroke ([place]) 04/19/2019  . Thalamic infarct, acute ([place]) 04/19/2019  . Anxiety state   . Chronic cystitis   . Cerebral thrombosis with cerebral infarction 04/15/2019  . Weakness of extremity 04/14/2019  . Stroke ([place])   . Hypertensive urgency   . Adjustment disorder with anxiety 05/07/2017  . Localized edema 06/12/2016  . Protein-calorie malnutrition, severe 05/06/2016  . Postoperative anemia due to acute blood loss   . Hip fracture ([place]) 05/04/2016  . Closed comminuted intertrochanteric fracture of proximal end of left femur ([place])   . Dizziness 10/05/2014  . Fatigue 10/05/2014  . Lipoma of back 02/15/2014  . Preventative health care 02/15/2014  . Depression 06/28/2013  . Prophylactic antibiotic 06/10/2013  . Microhematuria 05/05/2013  . HYPOTHYROIDISM 11/17/2008  . UNSPECIFIED VITAMIN D DEFICIENCY 11/17/2008  . HLD (hyperlipidemia) 11/17/2008  . ANEMIA 11/17/2008  . 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 Essential hypertension 07/21/2006  . ALLERGIC RHINITIS 07/21/2006  . ARTHRITIS 07/21/2006   Past Medical History:  Diagnosis Date  . Allergy   . Chronic interstitial cystitis   . Hypertension     Family History  Problem Relation Age of Onset  . Heart disease Mother   . Heart disease Father   . Anxiety disorder Sister   . Heart disease Brother   . Anxiety disorder Brother     Past Surgical History:  Procedure Laterality Date  . ABDOMINAL HYSTERECTOMY    . INTRAMEDULLARY (IM) NAIL INTERTROCHANTERIC  05/04/2016  . INTRAMEDULLARY (IM) NAIL INTERTROCHANTERIC Left 05/04/2016   Procedure: INTRAMEDULLARY (IM) NAIL INTERTROCHANTRIC;  Surgeon: Mcarthur Rossetti, MD;  Location: [facility];  Service: Orthopedics;  Laterality: Left;  . tonsillectomyy     Social History    Occupational History  . Not on file  Tobacco Use  . Smoking status: Never Smoker  . Smokeless tobacco: Never Used  Vaping Use  . Vaping Use: Never used  Substance and Sexual Activity  . Alcohol use: No  . Drug use: No  . Sexual activity: Not Currently    Birth control/protection: Post-menopausal

## 2019-11-08 NOTE — Telephone Encounter (Signed)
done

## 2019-11-10 ENCOUNTER — Telehealth: Payer: Self-pay | Admitting: Family Medicine

## 2019-11-10 NOTE — Telephone Encounter (Signed)
Patient's son Brooke Collier called with question about patient's medications. Brooke Collier wanted to know when he can increase dosage of Gabapentin. Also wants to know with patient is finished with medication is it safe for patient just to stop gabapentin. Please call Brooke Collier about these questions. Phone number is [phone].

## 2019-11-10 NOTE — Telephone Encounter (Signed)
Please advise 

## 2019-11-11 NOTE — Telephone Encounter (Signed)
I called and spoke with Brooke Collier, advising him on the instructions for the Gabapentin. He said he will start with adding the morning dose first, and then possibly add another dose midday. Advised him to call us with any further questions/concerns on this.

## 2019-11-11 NOTE — Telephone Encounter (Signed)
Ok to increase dosage now.  When she wants to stop, it would probably be best to taper off the medicine by stopping one of the daily pills every 3-5 days.  I can give detailed instructions if/when the time comes.

## 2019-11-12 ENCOUNTER — Telehealth: Payer: Self-pay | Admitting: Family Medicine

## 2019-11-12 NOTE — Telephone Encounter (Signed)
Yes, that would be fine.

## 2019-11-12 NOTE — Telephone Encounter (Signed)
Patient's daughter in [facility] Sharyn Lull) called advised patient has been moved to a rehab center ([facility]) Sharyn Lull said she is calling for patient's son Ulice Dash. Sharyn Lull asked if patient can get a hospital bed which [facility] said they would get the process started if they could get office notes that show medical necessity,demographics and Rx for the bed. The number to contact [facility] is [phone] Customer Service Ask for     Josepha Pigg   The number to contact patient's son Ulice Dash is [phone]

## 2019-11-12 NOTE — Telephone Encounter (Signed)
Please advise 

## 2019-11-12 NOTE — Telephone Encounter (Signed)
Faxed the order, demographics and ov notes to [facility] [phone]. I gave them Jay's # to call and advised them to let us know if they need anything more from [place] on this.

## 2019-11-12 NOTE — Telephone Encounter (Signed)
I left a message on Brooke Collier's voice mail that we are working on this for his mom. I or [employer] will be back in touch, once we know more.

## 2019-11-15 NOTE — Progress Notes (Addendum)
Due to patient's severe pain and spine degenerative changes, she requires re-positioning of the body in ways that cannot be achieved with an ordinary bed or wedge pillow to reduce her pain, reduce pressure, prevent pressure sores.  She has significant weakness and pain and is unable to change positions in bed on her own.  Therefore it is my opinion that she would benefit from a hospital/adjustable bed.

## 2019-11-16 ENCOUNTER — Telehealth: Payer: Self-pay | Admitting: Family Medicine

## 2019-11-16 NOTE — Telephone Encounter (Signed)
Jeneen Rinks with [facility] called asking for notes that reference the pt needing a bed since the notes we sent previously wasn't sufficient.   Fax# [phone]

## 2019-11-16 NOTE — Telephone Encounter (Signed)
On the 3rd try of the day, the fax went through - included an addendum Dr. Junius Roads dictated on the 11/05/19 ov note.

## 2019-11-23 ENCOUNTER — Telehealth: Payer: Self-pay | Admitting: Family Medicine

## 2019-11-23 NOTE — Telephone Encounter (Signed)
Received call from patient's son Ulice Dash wanting to follow up on trying to get a permanent hospital bed for his mother. Ulice Dash asked for a call back as soon as possible. The number to contact Ulice Dash is [phone]

## 2019-11-23 NOTE — Telephone Encounter (Signed)
I called Ulice Dash to let him know that [employer] was sent all notes, demographics and order for the bed last week. The addendum to the office note was sent to them on 11/16/19. They have Jay's phone number so that [employer] can call him when they have more information. He said the patient is using a loaner bed, that is a similar type as the one she will be getting.  However, [facility] just has the one for short periods. Ulice Dash did say the loaner bed is helping his mom's back pain. Hopefully, the hospital bed will be approved soon.

## 2019-12-03 ENCOUNTER — Ambulatory Visit
Admission: RE | Admit: 2019-12-03 | Discharge: 2019-12-03 | Disposition: A | Discharge status: Home / Self Care | Payer: Medicare HMO | Source: Ambulatory Visit | Attending: Family Medicine | Admitting: Family Medicine

## 2019-12-03 ENCOUNTER — Other Ambulatory Visit: Payer: Self-pay

## 2019-12-03 DIAGNOSIS — G8929 Other chronic pain: Secondary | ICD-10-CM

## 2019-12-03 DIAGNOSIS — R0781 Pleurodynia: Secondary | ICD-10-CM

## 2019-12-03 DIAGNOSIS — R109 Unspecified abdominal pain: Secondary | ICD-10-CM

## 2019-12-03 DIAGNOSIS — M545 Low back pain, unspecified: Secondary | ICD-10-CM

## 2019-12-06 ENCOUNTER — Telehealth: Payer: Self-pay | Admitting: Family Medicine

## 2019-12-06 DIAGNOSIS — R109 Unspecified abdominal pain: Secondary | ICD-10-CM

## 2019-12-06 DIAGNOSIS — S22070G Wedge compression fracture of T9-T10 vertebra, subsequent encounter for fracture with delayed healing: Secondary | ICD-10-CM

## 2019-12-06 DIAGNOSIS — R0781 Pleurodynia: Secondary | ICD-10-CM

## 2019-12-06 NOTE — Telephone Encounter (Signed)
Thoracic MRI scan is notable for the following:  There are 3 compression fractures, T9, T10, and L1, which could be a source of pain.  There are several disc protrusions on the left, but I don't think these would be the cause of her right-sided pain.  I recommend consultation with a spine surgeon to discuss the possibility of vertebroplasty procedure or possibly an injection.

## 2019-12-06 NOTE — Telephone Encounter (Signed)
Diane with Radiology called wanting to make [place] aware the pts MRI was ready to view

## 2019-12-22 ENCOUNTER — Telehealth: Payer: Self-pay

## 2019-12-22 NOTE — Telephone Encounter (Signed)
Ulice Dash called he wants to know the next for the patient, he also wants to know if a MRI review appointment is necessary call back:[phone]

## 2019-12-23 ENCOUNTER — Telehealth: Payer: Self-pay

## 2019-12-23 NOTE — Telephone Encounter (Signed)
I called and spoke with Brooke Collier: he had not received the [facility] MRI results until the neurosurgeon's office called him yesterday. The patient does have her back brace and is still working with the physical therapist. Per Brooke Collier, the patient is not having as much back pain - it's more in the ribs area again. The patient has told him that she "could not be fixed" and does not wish to have anything done for her back. Brooke Collier wants to know if the fractures and other back issues will heal on their own, or will they worsen if she does no more than continue the brace and PT?

## 2019-12-23 NOTE — Telephone Encounter (Signed)
I called and spoke with Ulice Dash: he had not received the T[facility] MRI results until the neurosurgeon's office called him yesterday. The patient does have her back brace and is still working with the physical therapist. Per Ulice Dash, the patient is not having as much back pain - it's more in the ribs area again. The patient has told him that she "could not be fixed" and does not wish to have anything done for her back. Ulice Dash wants to know if the fractures and other back issues will heal on their own, or will they worsen if she does no more than continue the brace and PT?

## 2019-12-23 NOTE — Telephone Encounter (Signed)
Sent this to Dr. Junius Roads in a separate message (Epic quit working mid message).

## 2019-12-24 NOTE — Telephone Encounter (Signed)
I called and advised Brooke Collier of this. He will pass this on to his mom, the patient.

## 2019-12-24 NOTE — Telephone Encounter (Signed)
Yes, with time the compression fractures will most often heal on their own.  It can take anywhere from 3-12 months depending on the patient.  Usually won't worsen as long as she uses pain as a guide.

## 2020-02-22 ENCOUNTER — Telehealth: Payer: Self-pay | Admitting: Adult Health

## 2020-02-22 NOTE — Telephone Encounter (Signed)
If she has been doing well and routinely follows with PCP for secondary stroke prevention measures, may cancel appointment and follow-up on an as-needed basis. PCP should be able to refill Plavix as well as Lexapro.  PCPs typically prescribe Plavix and secondary stroke prevention medications as we do not routinely follow patients long-term.  Thank you.

## 2020-02-22 NOTE — Telephone Encounter (Signed)
Pt's son(on DPR) is asking if this appointment is really needed since pt is in assisted living now.

## 2020-02-22 NOTE — Telephone Encounter (Signed)
I called pts son.  I relayed that if she is doing ok and pcp is willing to take over plavix and lexapro refills, then no need to see [place], only as needed basis.  He verbalized understanding.   He appreciated information.

## 2020-03-06 ENCOUNTER — Ambulatory Visit: Payer: Medicare HMO | Admitting: Adult Health

## 2020-06-05 ENCOUNTER — Emergency Department (HOSPITAL_COMMUNITY): Payer: Medicare HMO

## 2020-06-05 ENCOUNTER — Other Ambulatory Visit: Payer: Self-pay

## 2020-06-05 ENCOUNTER — Emergency Department (HOSPITAL_COMMUNITY)
Admission: EM | Admit: 2020-06-05 | Discharge: 2020-06-06 | Disposition: A | Discharge status: Skilled Nursing Facility | Payer: Medicare HMO | Attending: Emergency Medicine | Admitting: Emergency Medicine

## 2020-06-05 DIAGNOSIS — W050XXA Fall from non-moving wheelchair, initial encounter: Secondary | ICD-10-CM | POA: Insufficient documentation

## 2020-06-05 DIAGNOSIS — Z7902 Long term (current) use of antithrombotics/antiplatelets: Secondary | ICD-10-CM | POA: Diagnosis not present

## 2020-06-05 DIAGNOSIS — S8002XA Contusion of left knee, initial encounter: Secondary | ICD-10-CM | POA: Diagnosis not present

## 2020-06-05 DIAGNOSIS — S62337A Displaced fracture of neck of fifth metacarpal bone, left hand, initial encounter for closed fracture: Secondary | ICD-10-CM | POA: Insufficient documentation

## 2020-06-05 DIAGNOSIS — I10 Essential (primary) hypertension: Secondary | ICD-10-CM | POA: Insufficient documentation

## 2020-06-05 DIAGNOSIS — E039 Hypothyroidism, unspecified: Secondary | ICD-10-CM | POA: Insufficient documentation

## 2020-06-05 DIAGNOSIS — Z79899 Other long term (current) drug therapy: Secondary | ICD-10-CM | POA: Insufficient documentation

## 2020-06-05 DIAGNOSIS — W19XXXA Unspecified fall, initial encounter: Secondary | ICD-10-CM

## 2020-06-05 DIAGNOSIS — Y9289 Other specified places as the place of occurrence of the external cause: Secondary | ICD-10-CM | POA: Insufficient documentation

## 2020-06-05 DIAGNOSIS — S60922A Unspecified superficial injury of left hand, initial encounter: Secondary | ICD-10-CM | POA: Diagnosis present

## 2020-06-05 MED ORDER — HYDROCODONE-ACETAMINOPHEN 5-325 MG PO TABS
1.0000 | ORAL_TABLET | Freq: Once | ORAL | Status: AC
  Administered 2020-06-05: 1 via ORAL
  Filled 2020-06-05: qty 1

## 2020-06-05 NOTE — ED Notes (Signed)
Patient requested for her daughter to be called- Brooke Collier [phone]  No answer when called

## 2020-06-05 NOTE — ED Notes (Signed)
[facility] notified of need for transport back to [facility]

## 2020-06-05 NOTE — Progress Notes (Signed)
Orthopedic Tech Progress Note Patient Details:  Brooke Collier June 09, 1935 [id number]  Ortho Devices Type of Ortho Device: Ace wrap,Ulna gutter splint Ortho Device/Splint Location: left Ortho Device/Splint Interventions: Application   Post Interventions Patient Tolerated: Well Instructions Provided: Care of device   [name] 06/05/2020, 3:31 PM

## 2020-06-05 NOTE — Progress Notes (Signed)
Orthopedic Tech Progress Note Patient Details:  Brooke Collier 03-Aug-1935 [id number]  Ortho Devices Type of Ortho Device: Knee Immobilizer Ortho Device/Splint Location: left Ortho Device/Splint Interventions: Application   Post Interventions Patient Tolerated: Well Instructions Provided: Care of device   [name] 06/05/2020, 3:34 PM

## 2020-06-05 NOTE — ED Triage Notes (Signed)
Patient arrives via EMS from [facility].  A malfunction in her wheelchair front wheel caused her to tumble out of the W/C.  The patient answers all questions appropriately.  The patient co having pain in her left hand, left hip and left knee area.  EMS reports no deformities noted.  The patient denies hitting her head during her fall from the wheelchair

## 2020-06-05 NOTE — Discharge Instructions (Addendum)
Use Tylenol as needed for the pain.  Follow-up with a hand specialist in 1 week for repeat evaluation.  Use the knee brace as long as necessary when you are having discomfort

## 2020-06-05 NOTE — ED Notes (Signed)
Patient continues to wait for [facility] to transport back to her facility.  Appears to resting

## 2020-06-05 NOTE — ED Notes (Signed)
Ortho Tech notified of need for splint ulnar gutter

## 2020-06-05 NOTE — ED Notes (Signed)
Patient attempted to stand- needed much assist

## 2020-06-05 NOTE — ED Provider Notes (Signed)
[facility] DEPT Provider Note   CSN: [id number] Arrival date & time: 06/05/20  1322     History Chief Complaint  Patient presents with  . Fall    Caleigh Bellefeuille Coriell is a 85 y.o. female.  Patient is an 85 year old female with a history of hypertension, stroke resulting in weakness in her left side, gait disturbance, prior proximal femur fracture on the left with an intramedullary nail who is presenting today after a fall.  Patient was being transported in a wheelchair which she normally uses when she is going from one location to another when the wheel fell off the wheelchair and the patient was dumped into the floor.  She hit her left shoulder, hand, knee on the floor.  Since that time she has had pain in her hand, knee and hip.  She has not tried standing or walking.  She denies any head injury, loss of consciousness.  She has no neck pain.  No specific back pain.  Her right leg feels pretty good except when she bends it it feels little tight.  Patient does take Plavix but no other anticoagulants.  The history is provided by the patient.  Fall This is a new problem. The current episode started 1 to 2 hours ago. The problem occurs constantly. The problem has not changed since onset.Associated symptoms comments: Left hand, knee and hip pain.. The symptoms are aggravated by bending. Nothing relieves the symptoms. She has tried nothing for the symptoms. The treatment provided no relief.       Past Medical History:  Diagnosis Date  . Allergy   . Chronic interstitial cystitis   . Hypertension     Patient Active Problem List   Diagnosis Date Noted  . Insomnia 07/06/2019  . Restless leg syndrome 06/22/2019  . 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 Gait disturbance, post-stroke 05/07/2019  . History of lacunar cerebrovascular accident (CVA) 05/05/2019  . Urge incontinence 05/05/2019  . Right-sided lacunar stroke ([place]) 04/19/2019  . Thalamic infarct,  acute ([place]) 04/19/2019  . Anxiety state   . Chronic cystitis   . Cerebral thrombosis with cerebral infarction 04/15/2019  . Weakness of extremity 04/14/2019  . Stroke ([place])   . Hypertensive urgency   . Adjustment disorder with anxiety 05/07/2017  . Localized edema 06/12/2016  . Protein-calorie malnutrition, severe 05/06/2016  . Postoperative anemia due to acute blood loss   . Hip fracture ([place]) 05/04/2016  . Closed comminuted intertrochanteric fracture of proximal end of left femur ([place])   . Dizziness 10/05/2014  . Fatigue 10/05/2014  . Lipoma of back 02/15/2014  . Preventative health care 02/15/2014  . Depression 06/28/2013  . Prophylactic antibiotic 06/10/2013  . Microhematuria 05/05/2013  . HYPOTHYROIDISM 11/17/2008  . UNSPECIFIED VITAMIN D DEFICIENCY 11/17/2008  . HLD (hyperlipidemia) 11/17/2008  . ANEMIA 11/17/2008  . 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 Essential hypertension 07/21/2006  . ALLERGIC RHINITIS 07/21/2006  . ARTHRITIS 07/21/2006    Past Surgical History:  Procedure Laterality Date  . ABDOMINAL HYSTERECTOMY    . INTRAMEDULLARY (IM) NAIL INTERTROCHANTERIC  05/04/2016  . INTRAMEDULLARY (IM) NAIL INTERTROCHANTERIC Left 05/04/2016   Procedure: INTRAMEDULLARY (IM) NAIL INTERTROCHANTRIC;  Surgeon: Mcarthur Rossetti, MD;  Location: [facility]S;  Service: Orthopedics;  Laterality: Left;  . tonsillectomyy       OB History   No obstetric history on file.     Family History  Problem Relation Age of Onset  . Heart disease Mother   .  Heart disease Father   . Anxiety disorder Sister   . Heart disease Brother   . Anxiety disorder Brother     Social History   Tobacco Use  . Smoking status: Never Smoker  . Smokeless tobacco: Never Used  Vaping Use  . Vaping Use: Never used  Substance Use Topics  . Alcohol use: No  . Drug use: No    Home Medications Prior to Admission medications   Medication Sig Start Date End Date Taking? Authorizing  Provider  acetaminophen (TYLENOL) 325 MG tablet Take 2 tablets (650 mg total) by mouth every 6 (six) hours as needed (Pain, Hadache or Fever >/= 101). 05/06/16   Barton Dubois, MD  ALPRAZolam Duanne Moron) 0.25 MG tablet Take 0.5 tablets (0.125 mg total) by mouth at bedtime as needed for anxiety. 05/31/19   Raulkar, Clide Deutscher, MD  amLODipine (NORVASC) 2.5 MG tablet Take 1 tablet (2.5 mg total) by mouth daily. 05/31/19   Raulkar, Clide Deutscher, MD  atorvastatin (LIPITOR) 40 MG tablet TAKE 1 TABLET BY MOUTH EVERY DAY AT 6PM FOR CHOLESTEROL 08/24/19   Raulkar, Clide Deutscher, MD  Calcium Carb-Cholecalciferol (CALCIUM-VITAMIN D) 500-200 MG-UNIT tablet Take 1 tablet by mouth daily.    [provider]  carboxymethylcellulose (REFRESH PLUS) 0.5 % SOLN Place 1 drop into both eyes daily as needed (dry eyes). 05/31/19   Raulkar, Clide Deutscher, MD  clopidogrel (PLAVIX) 75 MG tablet Take 1 tablet (75 mg total) by mouth daily. 09/30/19   Frann Rider, NP  Co-Enzyme Q-10 30 MG CAPS Take 1 capsule (30 mg total) by mouth daily. 05/31/19   Raulkar, Clide Deutscher, MD  diclofenac Sodium (VOLTAREN) 1 % GEL Apply 2 g topically 4 (four) times daily. 05/31/19   Raulkar, Clide Deutscher, MD  escitalopram (LEXAPRO) 5 MG tablet Take 1 tablet (5 mg total) by mouth daily. 06/03/19   Frann Rider, NP  flavoxATE (URISPAS) 100 MG tablet Take 1 tablet (100 mg total) by mouth 2 (two) times daily as needed for bladder spasms. 05/31/19   Raulkar, Clide Deutscher, MD  gabapentin (NEURONTIN) 100 MG capsule 1 PO q HS, may increase to 1 PO TID if needed 11/05/19   Hilts, Michael, MD  irbesartan (AVAPRO) 150 MG tablet TAKE 1 TABLET BY MOUTH EVERY DAY 08/24/19   Raulkar, Clide Deutscher, MD  Magnesium 100 MG TABS Take 1 tablet (100 mg total) by mouth daily. Patient taking differently: Take 300 mg by mouth daily.  05/31/19   Raulkar, Clide Deutscher, MD  Misc Natural Products [facility]) LIQD Take 1 tablet by mouth in the morning and at bedtime. 05/31/19   Raulkar, Clide Deutscher,  MD  ondansetron (ZOFRAN) 4 MG tablet Take 4 mg by mouth every 8 (eight) hours as needed.  06/10/19   [provider]  Probiotic Product (PROBIOTIC DAILY PO) Take by mouth daily.    [provider]  traZODone (DESYREL) 50 MG tablet Take 50 mg by mouth at bedtime. 07/06/19   [provider]  Vitamin D, Cholecalciferol, 50 MCG (2000 UT) CAPS Take by mouth in the morning and at bedtime.    [provider]    Allergies    Ciprofloxacin and Zoloft [sertraline]  Review of Systems   Review of Systems  All other systems reviewed and are negative.   Physical Exam Updated Vital Signs BP (!) 144/85   Pulse 76   Temp 97.8 F (36.6 C) (Oral)   Resp (!) 22   Ht 5\' 3"  (1.6 m)  Wt 48.1 kg   SpO2 96%   BMI 18.78 kg/m   Physical Exam Vitals and nursing note reviewed.  Constitutional:      General: She is not in acute distress.    Appearance: She is well-developed.  HENT:     Head: Normocephalic and atraumatic.     Nose: Nose normal.     Mouth/Throat:     Mouth: Mucous membranes are moist.  Eyes:     Pupils: Pupils are equal, round, and reactive to light.  Cardiovascular:     Rate and Rhythm: Normal rate and regular rhythm.     Heart sounds: Normal heart sounds. No murmur heard. No friction rub.  Pulmonary:     Effort: Pulmonary effort is normal.     Breath sounds: Normal breath sounds. No wheezing or rales.  Abdominal:     General: Bowel sounds are normal. There is no distension.     Palpations: Abdomen is soft.     Tenderness: There is no abdominal tenderness. There is no guarding or rebound.  Musculoskeletal:        General: Tenderness present. Normal range of motion.     Left wrist: Normal.       Hands:     Cervical back: Normal range of motion and neck supple. No tenderness. No spinous process tenderness or muscular tenderness.     Right hip: Normal.     Right knee: Normal.       Legs:     Comments: No edema.  Kyphosis present without  any point tenderness over the vertebra  Skin:    General: Skin is warm and dry.     Findings: No rash.  Neurological:     Mental Status: She is alert and oriented to person, place, and time. Mental status is at baseline.     Cranial Nerves: No cranial nerve deficit.     Motor: Weakness present.     Comments: Weakness in the left lower leg which is chronic  Psychiatric:        Mood and Affect: Mood normal.        Behavior: Behavior normal.        Thought Content: Thought content normal.     ED Results / Procedures / Treatments   Labs (all labs ordered are listed, but only abnormal results are displayed) Labs Reviewed - No data to display  EKG None  Radiology DG Knee Complete 4 Views Left  Result Date: 06/05/2020 CLINICAL DATA:  Pain following fall EXAM: LEFT KNEE - COMPLETE 4+ VIEW COMPARISON:  None. FINDINGS: Frontal, lateral, and bilateral oblique views were obtained. Bones are osteoporotic. Postoperative change noted in the distal femur. There is no apparent acute fracture or dislocation. There is a small joint effusion. There is mild patellofemoral joint space narrowing. Other joint spaces appear unremarkable. There are scattered foci of arterial vascular calcification. IMPRESSION: Bones osteoporotic. Small joint effusion. No acute fracture or dislocation demonstrable. Postoperative change distal femur. Mild narrowing patellofemoral joint. Foci of arterial vascular calcification noted. Electronically Signed   By: Lowella Grip III M.D.   On: 06/05/2020 14:30   DG Hand Complete Left  Result Date: 06/05/2020 CLINICAL DATA:  Left hand pain due to an injury suffered in a fall. Initial encounter. EXAM: LEFT HAND - COMPLETE 3+ VIEW COMPARISON:  None. FINDINGS: The patient has an acute nondisplaced fracture of the distal diaphysis of the fifth metacarpal. No other fracture is identified. Scattered osteoarthritis is seen about interphalangeal joints and  at the first [facility] joint. Bones  appear osteopenic. No chondrocalcinosis. IMPRESSION: Acute nondisplaced fracture distal diaphysis of the fifth metacarpal. No other acute finding. Osteopenia. Scattered mild-to-moderate osteoarthritis. Electronically Signed   By: Inge Rise M.D.   On: 06/05/2020 14:31    Procedures Procedures   Medications Ordered in ED Medications - No data to display  ED Course  I have reviewed the triage vital signs and the nursing notes.  Pertinent labs & imaging results that were available during my care of the patient were reviewed by me and considered in my medical decision making (see chart for details).    MDM Rules/Calculators/A&P                          Patient is an 85 year old female with multiple medical problems presenting today after the wheel fell off the wheelchair she was being transported in and she was dumped onto the floor.  She has been complaining of pain in her knee, hip and left hand since this accident.  No head injury or loss of consciousness.  On exam there is no significant deformity but patient has a prior history of femur fracture with an intramedullary nail.  She is able to lift the leg but pain with palpation of the distal femur and when attempting to flex the knee and hip.  Also has bruising over the left lateral hand.  No wrist involvement.  Plain films are pending.  3:27 PM Plain films of the knee are negative except for small effusion but no bony injury.  Left hand with acute nondisplaced fracture of the distal diaphysis of the fifth metacarpal and patient was placed in an ulnar gutter splint.  Patient refused hip imaging because she did not feel like there were any injuries there.  Patient is tearful on exam and reports she does not like living where she lives but her son is the power of attorney and states that she needs to stay where she is at.  We will confirm that patient is able to stand and bear weight on the left hip if not we will strongly encourage her to  get imaging.  Otherwise she is stable for discharge.  MDM Number of Diagnoses or Management Options   Amount and/or Complexity of Data Reviewed Tests in the radiology section of CPT: ordered and reviewed Independent visualization of images, tracings, or specimens: yes      Final Clinical Impression(s) / ED Diagnoses Final diagnoses:  Fall, initial encounter  Closed displaced fracture of neck of fifth metacarpal bone of left hand, initial encounter  Contusion of left knee, initial encounter    Rx / DC Orders ED Discharge Orders    None       Blanchie Dessert, MD 06/05/20 1530

## 2020-06-05 NOTE — ED Notes (Signed)
Rosa Sanchez notified of patient's discharge/ will be transferred back. Report given to Josephina

## 2020-07-05 ENCOUNTER — Ambulatory Visit: Payer: Self-pay

## 2020-07-05 ENCOUNTER — Ambulatory Visit (INDEPENDENT_AMBULATORY_CARE_PROVIDER_SITE_OTHER): Payer: Medicare HMO | Admitting: Family Medicine

## 2020-07-05 ENCOUNTER — Other Ambulatory Visit: Payer: Self-pay

## 2020-07-05 DIAGNOSIS — S62307A Unspecified fracture of fifth metacarpal bone, left hand, initial encounter for closed fracture: Secondary | ICD-10-CM | POA: Diagnosis not present

## 2020-07-05 NOTE — Progress Notes (Signed)
I saw and examined the patient with Dr. Elouise Munroe and agree with assessment and plan as outlined.    Healing left 5th metacarpal fracture with acceptable angulation.  Stiffness of fingers due to immobilization.  Will start PT/OT.  Return in 3-4 weeks as needed.

## 2020-07-05 NOTE — Progress Notes (Signed)
Office Visit Note   Patient: Brooke Collier           Date of Birth: [birth year]           MRN: [id number] Visit Date: 07/05/2020 Requested by: Robyne Peers, MD [address] [place],  [place] [address] PCP: Merlene Laughter, MD  Subjective: Chief Complaint  Patient presents with  . Left Hand - Fracture, Injury, Pain    DOI 06/05/20 The wheel fell off her wheelchair and she fell out of her chair, injuring the left hand and bumped her knee. Right-hand dominant. In ulna gutter splint since the ED visit.    HPI: 85yo F presenting to clinic one month after falling from her wheelchair and sustaining a Left 5th metacarpal fracture. Patient has been immobilized in a short arm ulnar gutter cast, with fingers in full extension, since her initial injury date. She says she continues to have pain throughout her right hand, and has not tried to move her fingers much since the splint was placed. No numbness in the fingers.               Objective: Vital Signs: There were no vitals taken for this visit.  Physical Exam:  General:  Alert and oriented, in no acute distress. Pulm:  Breathing unlabored. Psy:  Normal mood, congruent affect. Skin:  Left hand with ecchymosis over lateral dorsal aspect, focused over area of the 5th MCP joint and proximal phalanx.  Left Hand/ Wrist Exam:  Left 5th finger appears to have swelling over the PIP and MCP Joints, which are very tender to palpation. Some tenderness over the 2nd and 3rd metacarpal shafts as well.  Full ROM in all unaffected fingers, as well as wrist.  Limited ROM in the 5th and 4th fingers on the left due to pain.   Sensation intact throughout all fingers Brisk capillary refill distal to fracture site.    Imaging: Left Hand XR:  Redemonstration of distal fifth metacarpal fracture, with minimal displacement- now with callous formation.   Assessment & Plan: 85 yo F presenting to clinic one month after sustaining a left  5th metacarpal fracture. Appears to be healing well on Xray, with adequate callous formation.  - Will remove Ulnar gutter splint to help encourage movement in her fingers. She has PT in her living facility. Encouraged to work with them to help with her range of motion. - No further imaging needed unless her symptoms worsen or fail to improve - Placed in Carpal tunnel brace to protect fracture sight without restricting finger movement. - Patient and her son express understanding with plan. They have no further questions or concerns today.      Procedures: No procedures performed        PMFS History: Patient Active Problem List   Diagnosis Date Noted  . Insomnia 07/06/2019  . Restless leg syndrome 06/22/2019  . 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 Gait disturbance, post-stroke 05/07/2019  . History of lacunar cerebrovascular accident (CVA) 05/05/2019  . Urge incontinence 05/05/2019  . Right-sided lacunar stroke ([place]) 04/19/2019  . Thalamic infarct, acute ([place]) 04/19/2019  . Anxiety state   . Chronic cystitis   . Cerebral thrombosis with cerebral infarction 04/15/2019  . Weakness of extremity 04/14/2019  . Stroke ([place])   . Hypertensive urgency   . Adjustment disorder with anxiety 05/07/2017  . Localized edema 06/12/2016  . Protein-calorie malnutrition, severe 05/06/2016  . Postoperative anemia due to acute blood  loss   . Hip fracture ([place]) 05/04/2016  . Closed comminuted intertrochanteric fracture of proximal end of left femur ([place])   . Dizziness 10/05/2014  . Fatigue 10/05/2014  . Lipoma of back 02/15/2014  . Preventative health care 02/15/2014  . Depression 06/28/2013  . Prophylactic antibiotic 06/10/2013  . Microhematuria 05/05/2013  . HYPOTHYROIDISM 11/17/2008  . UNSPECIFIED VITAMIN D DEFICIENCY 11/17/2008  . HLD (hyperlipidemia) 11/17/2008  . ANEMIA 11/17/2008  . 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 Essential hypertension 07/21/2006  . ALLERGIC  RHINITIS 07/21/2006  . ARTHRITIS 07/21/2006   Past Medical History:  Diagnosis Date  . Allergy   . Chronic interstitial cystitis   . Hypertension     Family History  Problem Relation Age of Onset  . Heart disease Mother   . Heart disease Father   . Anxiety disorder Sister   . Heart disease Brother   . Anxiety disorder Brother     Past Surgical History:  Procedure Laterality Date  . ABDOMINAL HYSTERECTOMY    . INTRAMEDULLARY (IM) NAIL INTERTROCHANTERIC  05/04/2016  . INTRAMEDULLARY (IM) NAIL INTERTROCHANTERIC Left 05/04/2016   Procedure: INTRAMEDULLARY (IM) NAIL INTERTROCHANTRIC;  Surgeon: Mcarthur Rossetti, MD;  Location: [facility];  Service: Orthopedics;  Laterality: Left;  . tonsillectomyy     Social History   Occupational History  . Not on file  Tobacco Use  . Smoking status: Never Smoker  . Smokeless tobacco: Never Used  Vaping Use  . Vaping Use: Never used  Substance and Sexual Activity  . Alcohol use: No  . Drug use: No  . Sexual activity: Not Currently    Birth control/protection: Post-menopausal

## 2021-05-05 IMAGING — CR DG HAND COMPLETE 3+V*L*
3 series · 3 of 3 positions shown · non-contrast
Comparison: None.

CLINICAL DATA: Left hand pain due to an injury suffered in a fall.
Initial encounter.

EXAM:
LEFT HAND - COMPLETE 3+ VIEW

[x hand pa left]
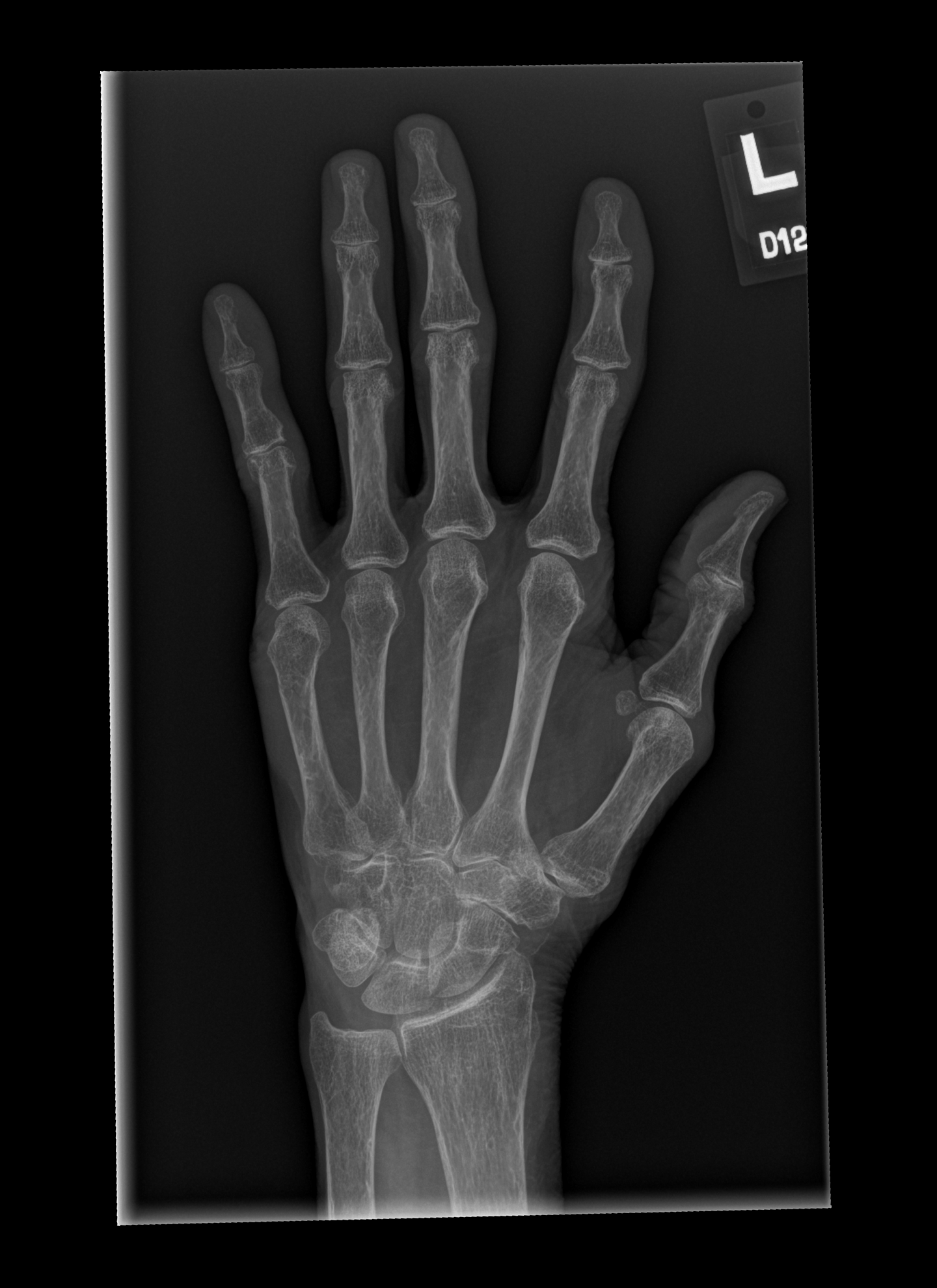

[x hand obl left]
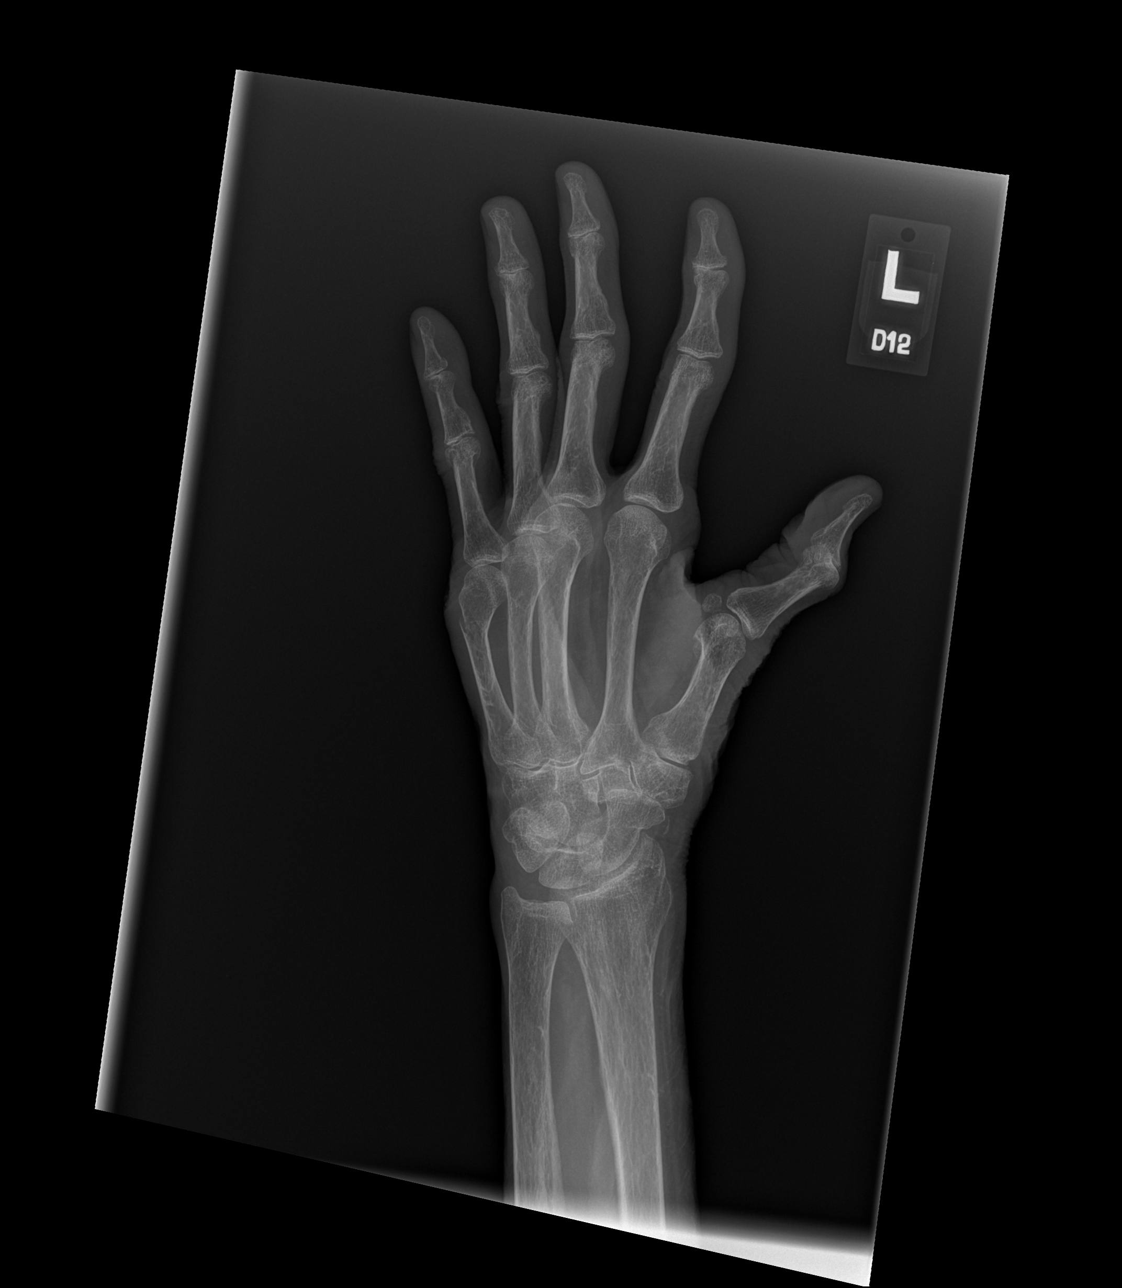

[x hand lat left]
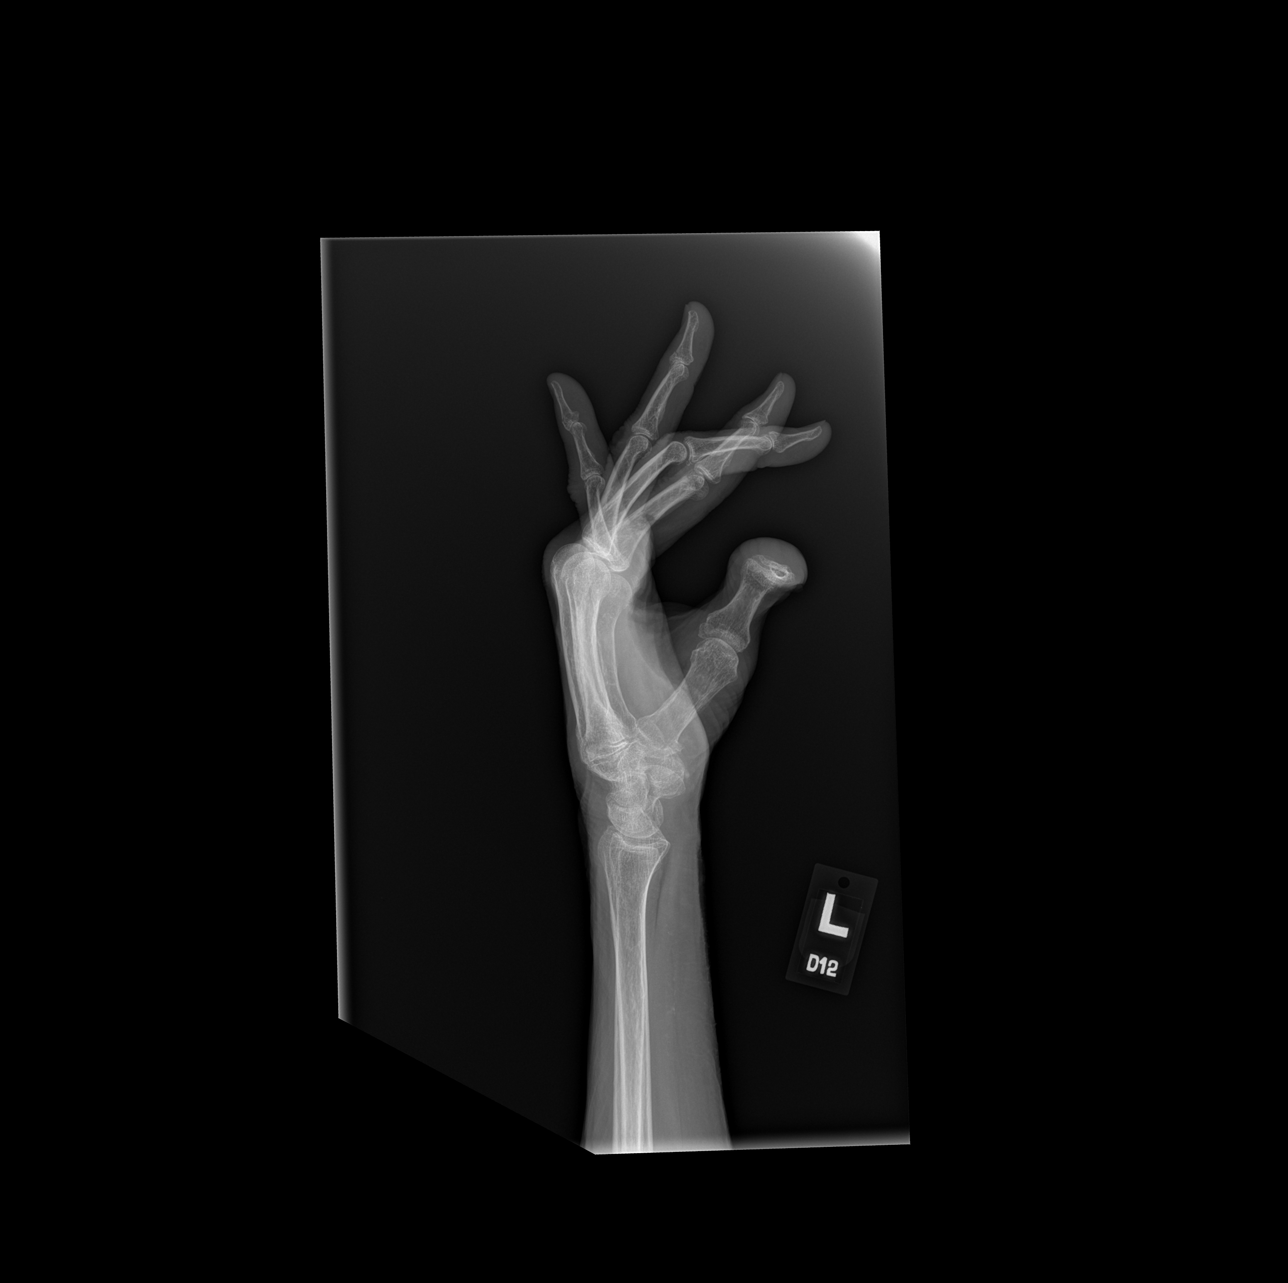

[3 of 3 positions shown; findings below may reference images not displayed]

FINDINGS: The patient has an acute nondisplaced fracture of the distal
diaphysis of the fifth metacarpal. No other fracture is identified.
Scattered osteoarthritis is seen about interphalangeal joints and at
the first [facility] joint. Bones appear osteopenic. No chondrocalcinosis.
IMPRESSION: Acute nondisplaced fracture distal diaphysis of the fifth
metacarpal. No other acute finding.

Osteopenia.

Scattered mild-to-moderate osteoarthritis.

## 2023-07-12 ENCOUNTER — Emergency Department (HOSPITAL_COMMUNITY)
Admission: EM | Admit: 2023-07-12 | Discharge: 2023-07-13 | Disposition: A | Discharge status: Home / Self Care | Attending: Emergency Medicine | Admitting: Emergency Medicine

## 2023-07-12 ENCOUNTER — Other Ambulatory Visit: Payer: Self-pay

## 2023-07-12 ENCOUNTER — Encounter (HOSPITAL_COMMUNITY): Payer: Self-pay

## 2023-07-12 ENCOUNTER — Emergency Department (HOSPITAL_COMMUNITY)

## 2023-07-12 DIAGNOSIS — I6782 Cerebral ischemia: Secondary | ICD-10-CM | POA: Diagnosis not present

## 2023-07-12 DIAGNOSIS — Z8673 Personal history of transient ischemic attack (TIA), and cerebral infarction without residual deficits: Secondary | ICD-10-CM | POA: Insufficient documentation

## 2023-07-12 DIAGNOSIS — Z87311 Personal history of (healed) other pathological fracture: Secondary | ICD-10-CM | POA: Insufficient documentation

## 2023-07-12 DIAGNOSIS — M545 Low back pain, unspecified: Secondary | ICD-10-CM | POA: Insufficient documentation

## 2023-07-12 DIAGNOSIS — W010XXA Fall on same level from slipping, tripping and stumbling without subsequent striking against object, initial encounter: Secondary | ICD-10-CM | POA: Diagnosis not present

## 2023-07-12 DIAGNOSIS — Z7902 Long term (current) use of antithrombotics/antiplatelets: Secondary | ICD-10-CM | POA: Insufficient documentation

## 2023-07-12 DIAGNOSIS — Y92129 Unspecified place in nursing home as the place of occurrence of the external cause: Secondary | ICD-10-CM | POA: Diagnosis not present

## 2023-07-12 DIAGNOSIS — Z8781 Personal history of (healed) traumatic fracture: Secondary | ICD-10-CM

## 2023-07-12 DIAGNOSIS — W19XXXA Unspecified fall, initial encounter: Secondary | ICD-10-CM

## 2023-07-12 NOTE — ED Notes (Signed)
 PT arrived from [facility] via [place] following a fall. According to the pt she slipped on her bottom. Fall was unwitnessed but down time was "at least an hour" per EMS. Pt c/o 5/10 neck pain.

## 2023-07-12 NOTE — ED Notes (Signed)
 Pt to CT via stretcher

## 2023-07-12 NOTE — ED Notes (Signed)
 Pt to XR via stretcher.

## 2023-07-13 NOTE — Discharge Instructions (Signed)
 Brooke Collier was seen in the emergency department after a fall  CAT scans x-rays did not show any new injuries however they did show chronic compression fractures in her lower spine likely related to previous falls and aging changes She should continue taking all previous prescribed medications Follow-up with her primary care doctor in 1 week for reevaluation Return to the emergency department for repeated falls, severe back pain or any other concerns

## 2023-07-13 NOTE — ED Notes (Signed)
 The patient's daughter at bedside and verbalized understanding of d/c instructions and follow up care.

## 2023-07-13 NOTE — ED Notes (Signed)
 Dr. Ranelle Buys at the bedside.

## 2023-07-13 NOTE — ED Notes (Signed)
 [facility] called

## 2023-07-13 NOTE — ED Notes (Signed)
[facility] has arrived. 

## 2023-07-13 NOTE — ED Provider Notes (Signed)
 [place] EMERGENCY DEPARTMENT AT [facility] Provider Note   CSN: [id number] Arrival date & time: 07/12/23  2217     History  Chief Complaint  Patient presents with   Neilah Fulwider is a 88 y.o. female.  With a history of stroke and repeated falls who presents to the ED after another fall.  Patient is a resident of a nursing facility where she reports that she "slid down" the bed in her room landing on her bottom.  No head trauma or loss of consciousness.  Now reporting some pain in her lower back.  No other injuries or complaints this time.  Denies headaches neck pain chest pain shortness of breath abdominal pain pain in extremities   Fall       Home Medications Prior to Admission medications   Medication Sig Start Date End Date Taking? Authorizing Provider  acetaminophen  (TYLENOL ) 325 MG tablet Take 2 tablets (650 mg total) by mouth every 6 (six) hours as needed (Pain, Hadache or Fever >/= 101). 05/06/16   Justina Oman, MD  ALPRAZolam  (XANAX ) 0.25 MG tablet Take 0.5 tablets (0.125 mg total) by mouth at bedtime as needed for anxiety. 05/31/19   Raulkar, Keven Pel, MD  amLODipine  (NORVASC ) 2.5 MG tablet Take 1 tablet (2.5 mg total) by mouth daily. 05/31/19   Raulkar, Keven Pel, MD  atorvastatin  (LIPITOR) 40 MG tablet TAKE 1 TABLET BY MOUTH EVERY DAY AT 6PM FOR CHOLESTEROL 08/24/19   Raulkar, Keven Pel, MD  Calcium  Carb-Cholecalciferol (CALCIUM -VITAMIN D) 500-200 MG-UNIT tablet Take 1 tablet by mouth daily.    [provider]  carboxymethylcellulose (REFRESH PLUS) 0.5 % SOLN Place 1 drop into both eyes daily as needed (dry eyes). 05/31/19   Raulkar, Keven Pel, MD  clopidogrel  (PLAVIX ) 75 MG tablet Take 1 tablet (75 mg total) by mouth daily. 09/30/19   Johny Nap, NP  Co-Enzyme Q-10 30 MG CAPS Take 1 capsule (30 mg total) by mouth daily. 05/31/19   Raulkar, Keven Pel, MD  diclofenac  Sodium (VOLTAREN ) 1 % GEL Apply 2 g topically 4 (four) times daily.  05/31/19   Raulkar, Keven Pel, MD  escitalopram  (LEXAPRO ) 5 MG tablet Take 1 tablet (5 mg total) by mouth daily. 06/03/19   Johny Nap, NP  flavoxATE (URISPAS ) 100 MG tablet Take 1 tablet (100 mg total) by mouth 2 (two) times daily as needed for bladder spasms. 05/31/19   Raulkar, Krutika P, MD  gabapentin  (NEURONTIN ) 100 MG capsule 1 PO q HS, may increase to 1 PO TID if needed 11/05/19   Hilts, Essense Bousquet, MD  irbesartan  (AVAPRO ) 150 MG tablet TAKE 1 TABLET BY MOUTH EVERY DAY 08/24/19   Raulkar, Keven Pel, MD  Magnesium  100 MG TABS Take 1 tablet (100 mg total) by mouth daily. Patient taking differently: Take 300 mg by mouth daily.  05/31/19   Raulkar, Keven Pel, MD  Misc Natural Products [facility]) LIQD Take 1 tablet by mouth in the morning and at bedtime. 05/31/19   Raulkar, Keven Pel, MD  ondansetron  (ZOFRAN ) 4 MG tablet Take 4 mg by mouth every 8 (eight) hours as needed.  06/10/19   [provider]  Probiotic Product (PROBIOTIC DAILY PO) Take by mouth daily.    [provider]  traZODone (DESYREL) 50 MG tablet Take 50 mg by mouth at bedtime. 07/06/19   [provider]  Vitamin D, Cholecalciferol, 50 MCG (2000 UT) CAPS Take by mouth in the morning and at bedtime.  [provider]      Allergies    Ciprofloxacin and Zoloft [sertraline]    Review of Systems   Review of Systems  Physical Exam Updated Vital Signs BP 135/76 (BP Location: Right Arm)   Pulse 90   Temp 98.4 F (36.9 C)   Resp 16   Ht 5\' 3"  (1.6 m)   Wt 48.1 kg   SpO2 94%   BMI 18.78 kg/m  Physical Exam Vitals and nursing note reviewed.  HENT:     Head: Normocephalic and atraumatic.  Eyes:     Pupils: Pupils are equal, round, and reactive to light.  Cardiovascular:     Rate and Rhythm: Normal rate and regular rhythm.  Pulmonary:     Effort: Pulmonary effort is normal.     Breath sounds: Normal breath sounds.  Abdominal:     Palpations: Abdomen is soft.     Tenderness:  There is no abdominal tenderness.  Musculoskeletal:     Cervical back: Neck supple. No tenderness.     Comments: Some midline tenderness of lower lumbar spine without deformity No thoracic tenderness midline tenderness step-off deformity 5-5 motor strength bilateral lower extremities with good flexion at the knee and both hips  Skin:    General: Skin is warm and dry.  Neurological:     Mental Status: She is alert.  Psychiatric:        Mood and Affect: Mood normal.     ED Results / Procedures / Treatments   Labs (all labs ordered are listed, but only abnormal results are displayed) Labs Reviewed - No data to display  EKG None  Radiology CT Cervical Spine Wo Contrast Result Date: 07/12/2023 EXAM: CT CERVICAL SPINE WITHOUT CONTRAST 07/12/2023 11:50:07 PM TECHNIQUE: CT of the cervical spine was performed without the administration of intravenous contrast. Multiplanar reformatted images are provided for review. Automated exposure control, iterative reconstruction, and/or weight-based adjustment of the mA/kV was utilized to reduce the radiation dose to as low as reasonably achievable. COMPARISON: None available. CLINICAL HISTORY: Neck trauma (Age >= 65y). Chief complaints; Fall; CT Head Wo Contrast; Head trauma, minor (Age >= 65y); CT Cervical Spine Wo Contrast; Neck trauma (Age >= 65y); CT Lumbar Spine Wo Contrast; Back trauma, no prior imaging (Age >= 16y), fall lumbar sacral pain FINDINGS: CERVICAL SPINE: BONES/ALIGNMENT: There is no acute fracture or traumatic malalignment. DEGENERATIVE CHANGES: Mild degenerative changes of the mid / lower cervical spine. SOFT TISSUES: There is no prevertebral soft tissue swelling. IMPRESSION: 1. No acute abnormality of the cervical spine. 2. Mild degenerative changes. Electronically signed by: Zadie Herter MD 07/12/2023 11:58 PM EDT RP Workstation: [id number]   CT Lumbar Spine Wo Contrast Result Date: 07/12/2023 EXAM: CT OF THE LUMBAR SPINE WITHOUT  CONTRAST 07/12/2023 11:50:07 PM TECHNIQUE: CT of the lumbar spine was performed without the administration of intravenous contrast. Multiplanar reformatted images are provided for review. Automated exposure control, iterative reconstruction, and/or weight based adjustment of the mA/kV was utilized to reduce the radiation dose to as low as reasonably achievable. COMPARISON: Lumbar radiographs dated 08/13/2019. CLINICAL HISTORY: Back trauma, no prior imaging (Age >= 16y); fall lumbar sacral pain. Chief complaints; Fall; CT Head Wo Contrast; Head trauma, minor (Age >= 65y); CT Cervical Spine Wo Contrast; Neck trauma (Age >= 65y); CT Lumbar Spine Wo Contrast; Back trauma, no prior imaging (Age >= 16y), fall lumbar sacral pain. FINDINGS: BONES AND ALIGNMENT: Moderate compression fracture deformities at L1, L3, and L4, chronic. DEGENERATIVE CHANGES: Mild degenerative changes  of the visualized thoracolumbar spine, most prominent at L5-S1. SOFT TISSUES: No paraspinal hematoma. LIMITED RETROPERITONEUM: 13 mm nonobstructing renal calculus in the right renal pelvis (image 49), without hydronephrosis. Atherosclerotic calcifications of the abdominal aorta and branch vessels. IMPRESSION: 1. No acute findings. 2. Moderate chronic compression fracture deformities at L1, L3, and L4. 3. 13 mm nonobstructing right renal calculus, without hydronephrosis. Electronically signed by: Zadie Herter MD 07/12/2023 11:57 PM EDT RP Workstation: [id number]   CT Head Wo Contrast Result Date: 07/12/2023 EXAM: CT HEAD WITHOUT 07/12/2023 11:50:07 PM TECHNIQUE: CT of the head was performed without the administration of intravenous contrast. Automated exposure control, iterative reconstruction, and/or weight based adjustment of the mA/kV was utilized to reduce the radiation dose to as low as reasonably achievable. COMPARISON: MRI brain 04/14/2019 CLINICAL HISTORY: Head trauma, minor (Age >= 65y). Chief complaints; Fall; Head trauma, minor (Age  >= 65y); Neck trauma (Age >= 65y); Back trauma, no prior imaging (Age >= 16y), fall lumbar sacral pain. FINDINGS: BRAIN AND VENTRICLES: There is no acute intracranial hemorrhage, mass effect or midline shift. No abnormal extra-axial fluid collection. The gray-white differentiation is maintained without evidence of an acute infarct. There is no evidence of hydrocephalus. Global cortical atrophy. Subcortical and periventricular small vessel ischemic changes. Intracranial atherosclerosis. ORBITS: The visualized portion of the orbits demonstrate no acute abnormality. SINUSES: The visualized paranasal sinuses and mastoid air cells demonstrate no acute abnormality. SOFT TISSUES AND SKULL: No acute abnormality of the visualized skull or soft tissues. IMPRESSION: 1. No acute intracranial abnormality. 2. Global cortical atrophy with small vessel ischemic changes. Electronically signed by: Zadie Herter MD 07/12/2023 11:54 PM EDT RP Workstation: [id number]   DG Chest 2 View Result Date: 07/12/2023 CLINICAL DATA:  Slip and fall injury.  Neck pain. EXAM: CHEST - 2 VIEW COMPARISON:  04/14/2019 FINDINGS: Cardiac enlargement with mild pulmonary vascular congestion. Interstitial changes in the lungs may represent mild edema. No focal consolidation. Small bilateral pleural effusions. No pneumothorax. Mediastinal contours appear intact. Degenerative changes in the spine. Anterior compression of several mid and lower thoracic vertebra of nonspecific age. IMPRESSION: 1. Cardiac enlargement with pulmonary vascular congestion and probable mild interstitial edema. 2. Small bilateral pleural effusions. 3. No focal consolidation. 4. Multiple vertebral compression deformities of indeterminate age. Electronically Signed   By: Boyce Byes M.D.   On: 07/12/2023 23:14    Procedures Procedures    Medications Ordered in ED Medications - No data to display  ED Course/ Medical Decision Making/ A&P Clinical Course as of 07/13/23  0015  Sun Jul 13, 2023  0013 Imaging shows chronic compression fractures no acute injury.  Discussed the findings with the patient and her daughter at bedside.  Feel comfortable with discharge back to skilled nursing facility as patient remains at baseline [MP]    Clinical Course User Index [MP] Sallyanne Creamer, DO                                 Medical Decision Making 88 year old female with history as above presents to the ED after fall.  Mechanism sounds more like she slid down the bed.  She recalls a fall well.  Did not hit her head.  Now with some lower back pain.  No anticoagulation.  Given that she is advanced age will obtain CT head C-spine to look for traumatic injury as well as plain film of her chest and lumbar spine CT  Amount  and/or Complexity of Data Reviewed Radiology: ordered.           Final Clinical Impression(s) / ED Diagnoses Final diagnoses:  History of vertebral compression fracture  Fall, initial encounter    Rx / DC Orders ED Discharge Orders     None         Sallyanne Creamer, DO 07/13/23 0015

## 2023-08-29 ENCOUNTER — Emergency Department (HOSPITAL_COMMUNITY)
Admission: EM | Admit: 2023-08-29 | Discharge: 2023-08-29 | Disposition: A | Discharge status: Home / Self Care | Attending: Emergency Medicine | Admitting: Emergency Medicine

## 2023-08-29 ENCOUNTER — Emergency Department (HOSPITAL_COMMUNITY)

## 2023-08-29 ENCOUNTER — Encounter (HOSPITAL_COMMUNITY): Payer: Self-pay

## 2023-08-29 ENCOUNTER — Other Ambulatory Visit: Payer: Self-pay

## 2023-08-29 DIAGNOSIS — S0012XA Contusion of left eyelid and periocular area, initial encounter: Secondary | ICD-10-CM | POA: Insufficient documentation

## 2023-08-29 DIAGNOSIS — I10 Essential (primary) hypertension: Secondary | ICD-10-CM | POA: Insufficient documentation

## 2023-08-29 DIAGNOSIS — Z79899 Other long term (current) drug therapy: Secondary | ICD-10-CM | POA: Insufficient documentation

## 2023-08-29 DIAGNOSIS — W19XXXA Unspecified fall, initial encounter: Secondary | ICD-10-CM | POA: Diagnosis not present

## 2023-08-29 DIAGNOSIS — T148XXA Other injury of unspecified body region, initial encounter: Secondary | ICD-10-CM

## 2023-08-29 DIAGNOSIS — M25571 Pain in right ankle and joints of right foot: Secondary | ICD-10-CM | POA: Diagnosis not present

## 2023-08-29 DIAGNOSIS — S6991XA Unspecified injury of right wrist, hand and finger(s), initial encounter: Secondary | ICD-10-CM | POA: Diagnosis present

## 2023-08-29 DIAGNOSIS — S61202A Unspecified open wound of right middle finger without damage to nail, initial encounter: Secondary | ICD-10-CM | POA: Insufficient documentation

## 2023-08-29 DIAGNOSIS — Y92129 Unspecified place in nursing home as the place of occurrence of the external cause: Secondary | ICD-10-CM | POA: Insufficient documentation

## 2023-08-29 DIAGNOSIS — S0083XA Contusion of other part of head, initial encounter: Secondary | ICD-10-CM | POA: Diagnosis not present

## 2023-08-29 NOTE — Discharge Instructions (Addendum)
 Your workup this evening was reassuring with no fractures or dislocations.  There was no intracranial abnormality on your head CT.  Follow-up with your primary care team for further wound care as needed. If you develop life threatening symptoms please return to the emergency department.

## 2023-08-29 NOTE — ED Triage Notes (Signed)
 Pt brought in by EMS from [facility] unit after unwitnessed fall tonight. Pt reportedly slid out of wheelchair and hit head on floor. Denies LOC. (+) Plavix . Pt A&Ox4, awake and alert. Reports mild tenderness to left eyebrow where there is a small hematoma as well as to right middle finger (small avulsion, bleeding controlled), and right ankle (no deformity, ROM and peripheral pulses intact, no edema). No cervical tenderness reported.

## 2023-08-29 NOTE — ED Notes (Signed)
 [facility] called

## 2023-08-29 NOTE — ED Notes (Signed)
 Skin tear to R middle finger cleaned.

## 2023-08-29 NOTE — ED Provider Notes (Signed)
 [place] [facility]MENT AT [facility] Provider Note   CSN: [id number] Arrival date & time: 08/29/23  0007     Patient presents with: Trauma   Brooke Collier is a 88 y.o. female.  Patient with past medical history significant for hypertension, CVA, gait disturbance due to CVA, currently in memory care presents to the emergency room via EMS after an apparent unwitnessed fall.  Staff reports they believe patient slid out of her wheelchair and hit her head on the floor.  No reported loss of consciousness.  The patient does take Plavix .  At the time of arrival the patient is alert and oriented to baseline and complains of some tenderness to the left eyebrow and a small area to the right middle finger.  She also complains of some pain to the right ankle.  She denies other complaints at this time   HPI     Prior to Admission medications   Medication Sig Start Date End Date Taking? Authorizing Provider  acetaminophen  (TYLENOL ) 325 MG tablet Take 2 tablets (650 mg total) by mouth every 6 (six) hours as needed (Pain, Hadache or Fever >/= 101). 05/06/16   Ricky Fines, MD  ALPRAZolam  (XANAX ) 0.25 MG tablet Take 0.5 tablets (0.125 mg total) by mouth at bedtime as needed for anxiety. 05/31/19   Raulkar, Sven SQUIBB, MD  amLODipine  (NORVASC ) 2.5 MG tablet Take 1 tablet (2.5 mg total) by mouth daily. 05/31/19   Raulkar, Sven SQUIBB, MD  atorvastatin  (LIPITOR) 40 MG tablet TAKE 1 TABLET BY MOUTH EVERY DAY AT 6PM FOR CHOLESTEROL 08/24/19   Raulkar, Sven SQUIBB, MD  Calcium  Carb-Cholecalciferol (CALCIUM -VITAMIN D) 500-200 MG-UNIT tablet Take 1 tablet by mouth daily.    [provider]  carboxymethylcellulose (REFRESH PLUS) 0.5 % SOLN Place 1 drop into both eyes daily as needed (dry eyes). 05/31/19   Raulkar, Sven SQUIBB, MD  clopidogrel  (PLAVIX ) 75 MG tablet Take 1 tablet (75 mg total) by mouth daily. 09/30/19   Whitfield Raisin, NP  Co-Enzyme Q-10 30 MG CAPS Take 1 capsule (30 mg total)  by mouth daily. 05/31/19   Raulkar, Sven SQUIBB, MD  diclofenac  Sodium (VOLTAREN ) 1 % GEL Apply 2 g topically 4 (four) times daily. 05/31/19   Raulkar, Sven SQUIBB, MD  escitalopram  (LEXAPRO ) 5 MG tablet Take 1 tablet (5 mg total) by mouth daily. 06/03/19   Whitfield Raisin, NP  flavoxATE (URISPAS ) 100 MG tablet Take 1 tablet (100 mg total) by mouth 2 (two) times daily as needed for bladder spasms. 05/31/19   Raulkar, Krutika P, MD  gabapentin  (NEURONTIN ) 100 MG capsule 1 PO q HS, may increase to 1 PO TID if needed 11/05/19   Hilts, Michael, MD  irbesartan  (AVAPRO ) 150 MG tablet TAKE 1 TABLET BY MOUTH EVERY DAY 08/24/19   Raulkar, Sven SQUIBB, MD  Magnesium  100 MG TABS Take 1 tablet (100 mg total) by mouth daily. Patient taking differently: Take 300 mg by mouth daily.  05/31/19   Raulkar, Sven SQUIBB, MD  Misc Natural Products [facility]) LIQD Take 1 tablet by mouth in the morning and at bedtime. 05/31/19   Raulkar, Sven SQUIBB, MD  ondansetron  (ZOFRAN ) 4 MG tablet Take 4 mg by mouth every 8 (eight) hours as needed.  06/10/19   [provider]  Probiotic Product (PROBIOTIC DAILY PO) Take by mouth daily.    [provider]  traZODone (DESYREL) 50 MG tablet Take 50 mg by mouth at bedtime. 07/06/19   [provider]  Vitamin D, Cholecalciferol, 50 MCG (2000 UT) CAPS Take by mouth in the morning and at bedtime.    [provider]    Allergies: Ciprofloxacin and Zoloft [sertraline]    Review of Systems  Updated Vital Signs BP 122/68   Pulse 61   Temp 97.9 F (36.6 C) (Axillary)   Resp 18   Ht 5' 3 (1.6 m)   Wt 48.1 kg   SpO2 97%   BMI 18.78 kg/m   Physical Exam Vitals and nursing note reviewed.  Constitutional:      General: She is not in acute distress.    Appearance: She is well-developed.  HENT:     Head: Normocephalic.     Comments: Periorbital hematoma noted at left eye Eyes:     Conjunctiva/sclera: Conjunctivae normal.  Cardiovascular:     Rate  and Rhythm: Normal rate and regular rhythm.     Heart sounds: No murmur heard. Pulmonary:     Effort: Pulmonary effort is normal. No respiratory distress.     Breath sounds: Normal breath sounds.  Abdominal:     Palpations: Abdomen is soft.     Tenderness: There is no abdominal tenderness.  Musculoskeletal:     Cervical back: Neck supple.     Comments: Patient with normal range of motion of the right hand and right ankle.  No significant tenderness on palpation of either extremity.  Patient does have a small skin wound to the right middle finger not amenable to closure.  Skin:    General: Skin is warm and dry.     Capillary Refill: Capillary refill takes less than 2 seconds.  Neurological:     Mental Status: She is alert.  Psychiatric:        Mood and Affect: Mood normal.     (all labs ordered are listed, but only abnormal results are displayed) Labs Reviewed - No data to display  EKG: None  Radiology: DG Finger Middle Right Result Date: 08/29/2023 CLINICAL DATA:  Recent fall with third digit pain, initial encounter EXAM: RIGHT MIDDLE FINGER 2+V COMPARISON:  None Available. FINDINGS: There is no evidence of fracture or dislocation. There is no evidence of arthropathy or other focal bone abnormality. Soft tissues are unremarkable. IMPRESSION: No acute abnormality noted. Electronically Signed   By: Oneil Devonshire M.D.   On: 08/29/2023 00:58   DG Ankle Complete Right Result Date: 08/29/2023 CLINICAL DATA:  Recent fall with right ankle pain, initial encounter EXAM: RIGHT ANKLE - COMPLETE 3+ VIEW COMPARISON:  None Available. FINDINGS: Mild osteopenia is noted. No acute fracture or dislocation is noted. Calcaneal spur is seen. No soft tissue abnormality is noted. IMPRESSION: No acute abnormality noted. Electronically Signed   By: Oneil Devonshire M.D.   On: 08/29/2023 00:58   DG Pelvis 1-2 Views Result Date: 08/29/2023 CLINICAL DATA:  Recent fall with pelvic pain, initial encounter EXAM:  PELVIS - 1 VIEW COMPARISON:  05/04/2016 FINDINGS: Postsurgical changes are noted in the proximal left hip. Pelvic ring is intact. No acute fracture or dislocation is noted. No soft tissue changes are seen. IMPRESSION: No acute abnormality noted. Electronically Signed   By: Oneil Devonshire M.D.   On: 08/29/2023 00:55   DG Chest 2 View Result Date: 08/29/2023 CLINICAL DATA:  Recent fall with chest pain, initial encounter EXAM: CHEST - 2 VIEW COMPARISON:  07/12/2023 FINDINGS: Cardiac shadow is mildly enlarged but stable. Lungs are hypoinflated. No focal infiltrate or effusion is seen. Compression deformities are noted in the  midthoracic spine stable from the prior study. IMPRESSION: No active cardiopulmonary disease. Electronically Signed   By: Oneil Devonshire M.D.   On: 08/29/2023 00:55   CT Head Wo Contrast Result Date: 08/29/2023 EXAM: CT HEAD AND CERVICAL SPINE 08/29/2023 12:31:38 AM TECHNIQUE: CT of the head and cervical spine was performed without the administration of intravenous contrast. Multiplanar reformatted images are provided for review. Automated exposure control, iterative reconstruction, and/or weight based adjustment of the mA/kV was utilized to reduce the radiation dose to as low as reasonably achievable. COMPARISON: CT cervical spine 07/12/2023 and CT head 07/12/2023. CLINICAL HISTORY: Head trauma, minor (Age >= 65y). unwitnessed fall tonight. Pt reportedly slid out of wheelchair and hit head on floor. Denies LOC. (+) Plavix . Pt A\T\Ox4, awake and alert. Reports mild tenderness to left eyebrow where there is a small hematoma as well as to right middle finger (small ; avulsion, bleeding controlled), and right ankle (no deformity, ROM and peripheral pulses intact, no edema). No cervical tenderness reported. FINDINGS: CT HEAD BRAIN AND VENTRICLES: No acute intracranial hemorrhage. No mass effect or midline shift. No abnormal extra-axial fluid collection. Gray-white differentiation is maintained. No  hydrocephalus. Chronic microvascular ischemia and generalized atrophy. ORBITS: No acute abnormality. Small left periorbital hematoma. SINUSES AND MASTOIDS: No acute abnormality. SOFT TISSUES AND SKULL: No acute skull fracture. No acute soft tissue abnormality. CT CERVICAL SPINE BONES AND ALIGNMENT: No acute fracture or traumatic malalignment. DEGENERATIVE CHANGES: Multilevel age-related spondylosis and facet arthropathy unchanged from 07/12/2023. No severe spinal canal narrowing. SOFT TISSUES: No prevertebral soft tissue swelling. IMPRESSION: 1. No acute intracranial abnormality. 2. No acute fracture or traumatic malalignment of the cervical spine. 3. Small left periorbital hematoma. No acute fracture. Electronically signed by: Norman Gatlin MD 08/29/2023 12:41 AM EDT RP Workstation: [id number]   CT Cervical Spine Wo Contrast Result Date: 08/29/2023 EXAM: CT HEAD AND CERVICAL SPINE 08/29/2023 12:31:38 AM TECHNIQUE: CT of the head and cervical spine was performed without the administration of intravenous contrast. Multiplanar reformatted images are provided for review. Automated exposure control, iterative reconstruction, and/or weight based adjustment of the mA/kV was utilized to reduce the radiation dose to as low as reasonably achievable. COMPARISON: CT cervical spine 07/12/2023 and CT head 07/12/2023. CLINICAL HISTORY: Head trauma, minor (Age >= 65y). unwitnessed fall tonight. Pt reportedly slid out of wheelchair and hit head on floor. Denies LOC. (+) Plavix . Pt A\T\Ox4, awake and alert. Reports mild tenderness to left eyebrow where there is a small hematoma as well as to right middle finger (small ; avulsion, bleeding controlled), and right ankle (no deformity, ROM and peripheral pulses intact, no edema). No cervical tenderness reported. FINDINGS: CT HEAD BRAIN AND VENTRICLES: No acute intracranial hemorrhage. No mass effect or midline shift. No abnormal extra-axial fluid collection. Gray-white  differentiation is maintained. No hydrocephalus. Chronic microvascular ischemia and generalized atrophy. ORBITS: No acute abnormality. Small left periorbital hematoma. SINUSES AND MASTOIDS: No acute abnormality. SOFT TISSUES AND SKULL: No acute skull fracture. No acute soft tissue abnormality. CT CERVICAL SPINE BONES AND ALIGNMENT: No acute fracture or traumatic malalignment. DEGENERATIVE CHANGES: Multilevel age-related spondylosis and facet arthropathy unchanged from 07/12/2023. No severe spinal canal narrowing. SOFT TISSUES: No prevertebral soft tissue swelling. IMPRESSION: 1. No acute intracranial abnormality. 2. No acute fracture or traumatic malalignment of the cervical spine. 3. Small left periorbital hematoma. No acute fracture. Electronically signed by: Norman Gatlin MD 08/29/2023 12:41 AM EDT RP Workstation: [id number]     Procedures   Medications Ordered in the ED -  No data to display                                  Medical Decision Making Amount and/or Complexity of Data Reviewed Radiology: ordered. ECG/medicine tests: ordered.   This patient presents to the ED for concern of injuries from a fall, this involves an extensive number of treatment options, and is a complaint that carries with it a high risk of complications and morbidity.  The differential diagnosis includes fracture, dislocation, intracranial abnormality, soft tissue injury, others   Co morbidities / Chronic conditions that complicate the patient evaluation  Patient with gait disturbance from previous stroke, currently in memory care   Additional history obtained:  Additional history obtained from EMR   Imaging Studies ordered:  I ordered imaging studies including CT scans of the head and cervical spine, plain films of the chest, pelvis, right ankle, right middle finger I independently visualized and interpreted imaging which showed small periorbital hematoma on the left, no other acute findings I agree  with the radiologist interpretation   Social Determinants of Health:  Patient reportedly resides in the memory care facility   Test / Admission - Considered:  Patient with reported mechanical fall evaluated for injuries.  Patient with small avulsion area of skin on the right finger which was cleaned and bandaged.  This wound was not amenable to closure.  Small hematoma above the left eye with no associated fracture.  No acute findings on head CT or cervical spine CT.  No bony fracture appreciated.  Patient appears stable for discharge back to our facility at this time.      Final diagnoses:  Fall, initial encounter  Avulsion of skin  Facial hematoma, initial encounter    ED Discharge Orders     None          Logan Ubaldo KATHEE DEVONNA 08/29/23 0158    Carita Senior, MD 08/29/23 1524

## 2023-08-29 NOTE — ED Notes (Signed)
 Trauma Response Nurse Documentation   Brooke Collier is a 88 y.o. female arriving to [facility] ED via EMS from [facility]  On clopidogrel  75 mg daily. Trauma was activated as a Level 2 by [employer] civil engineer based on the following trauma criteria Elderly patients > 65 with head trauma on anti-coagulation (excluding ASA).  Patient cleared for CT by Dr. Carita EDP. Pt transported to CT with trauma response nurse present to monitor. RN remained with the patient throughout their absence from the department for clinical observation.   GCS 15.   History   Past Medical History:  Diagnosis Date   Allergy    Chronic interstitial cystitis    Hypertension      Past Surgical History:  Procedure Laterality Date   ABDOMINAL HYSTERECTOMY     INTRAMEDULLARY (IM) NAIL INTERTROCHANTERIC  05/04/2016   INTRAMEDULLARY (IM) NAIL INTERTROCHANTERIC Left 05/04/2016   Procedure: INTRAMEDULLARY (IM) NAIL INTERTROCHANTRIC;  Surgeon: Brooke CINDERELLA Poli, MD;  Location: [facility] ORS;  Service: Orthopedics;  Laterality: Left;   tonsillectomyy         Initial Focused Assessment (If applicable, or please see trauma documentation): Alert female presents via EMS from SNF after a fall out of her wheelchair, hematoma to left forehead above eyebrow, skin tear to right middle finger.  Airway patent, BS clear Bleeding from finger skin tear controlled GCS 15 PERRLA 3  CT's Completed:   CT Head and CT C-Spine   Interventions:  CT head and cervical spine XRAY chest, pelvis, Right ankle, right middle finger Wound care   Plan for disposition:  Discharge home   Consults completed:  none   Event Summary: Pt presents after she slipped out of her wheelchair at [facility] memory care facility. Skin tear R middle finger, bleeding controlled, hematoma to left forehead above eyebrow. Trauma scans unremarkable, anticipate D/C back to facility.   Bedside handoff with ED RN Brooke Collier.    Brooke Collier   Trauma Response RN  Please call TRN at [phone] for further assistance.
